# Patient Record
Sex: Male | Born: 1955
Health system: Southern US, Community
[De-identification: ages and names within clinical notes are randomized; demographics above are authoritative.]

## PROBLEM LIST (undated history)

## (undated) DIAGNOSIS — I10 Essential (primary) hypertension: Secondary | ICD-10-CM

## (undated) DIAGNOSIS — E785 Hyperlipidemia, unspecified: Secondary | ICD-10-CM

## (undated) DIAGNOSIS — G4730 Sleep apnea, unspecified: Secondary | ICD-10-CM

## (undated) DIAGNOSIS — M1990 Unspecified osteoarthritis, unspecified site: Secondary | ICD-10-CM

## (undated) DIAGNOSIS — K219 Gastro-esophageal reflux disease without esophagitis: Secondary | ICD-10-CM

## (undated) DIAGNOSIS — T7840XA Allergy, unspecified, initial encounter: Secondary | ICD-10-CM

## (undated) HISTORY — DX: Unspecified osteoarthritis, unspecified site: M19.90

## (undated) HISTORY — DX: Allergy, unspecified, initial encounter: T78.40XA

## (undated) HISTORY — DX: Essential (primary) hypertension: I10

## (undated) HISTORY — DX: Gastro-esophageal reflux disease without esophagitis: K21.9

## (undated) HISTORY — DX: Hyperlipidemia, unspecified: E78.5

## (undated) HISTORY — PX: APPENDECTOMY: SHX54

## (undated) HISTORY — DX: Sleep apnea, unspecified: G47.30

---

## 2008-04-05 ENCOUNTER — Ambulatory Visit: Payer: Self-pay | Admitting: Internal Medicine

## 2008-06-14 ENCOUNTER — Ambulatory Visit: Payer: Self-pay | Admitting: Internal Medicine

## 2015-02-24 ENCOUNTER — Encounter: Payer: Self-pay | Admitting: Family Medicine

## 2015-03-17 ENCOUNTER — Encounter: Payer: PRIVATE HEALTH INSURANCE | Admitting: Family Medicine

## 2015-04-14 ENCOUNTER — Encounter: Payer: PRIVATE HEALTH INSURANCE | Admitting: Family Medicine

## 2015-05-09 ENCOUNTER — Telehealth: Payer: Self-pay | Admitting: Family Medicine

## 2015-05-09 MED ORDER — ATORVASTATIN CALCIUM 40 MG PO TABS: 40.0000 mg | ORAL_TABLET | Freq: Every day | ORAL | Status: DC

## 2015-05-09 NOTE — Telephone Encounter (Signed)
Medication has been refilled ans sent to [facility], notified patient to schedule appointment

## 2015-05-26 ENCOUNTER — Encounter: Payer: Self-pay | Admitting: Family Medicine

## 2015-05-26 ENCOUNTER — Ambulatory Visit (INDEPENDENT_AMBULATORY_CARE_PROVIDER_SITE_OTHER): Payer: PRIVATE HEALTH INSURANCE | Admitting: Family Medicine

## 2015-05-26 VITALS — BP 136/78 | HR 65 | Temp 97.8°F | Resp 16 | Ht 74.0 in | Wt 226.3 lb

## 2015-05-26 DIAGNOSIS — F17200 Nicotine dependence, unspecified, uncomplicated: Secondary | ICD-10-CM | POA: Insufficient documentation

## 2015-05-26 DIAGNOSIS — E782 Mixed hyperlipidemia: Secondary | ICD-10-CM | POA: Insufficient documentation

## 2015-05-26 DIAGNOSIS — J0110 Acute frontal sinusitis, unspecified: Secondary | ICD-10-CM | POA: Insufficient documentation

## 2015-05-26 DIAGNOSIS — M436 Torticollis: Secondary | ICD-10-CM | POA: Diagnosis not present

## 2015-05-26 DIAGNOSIS — G4700 Insomnia, unspecified: Secondary | ICD-10-CM | POA: Insufficient documentation

## 2015-05-26 DIAGNOSIS — R0789 Other chest pain: Secondary | ICD-10-CM

## 2015-05-26 DIAGNOSIS — Z0000 Encounter for general adult medical examination without abnormal findings: Secondary | ICD-10-CM | POA: Insufficient documentation

## 2015-05-26 DIAGNOSIS — R0982 Postnasal drip: Secondary | ICD-10-CM | POA: Insufficient documentation

## 2015-05-26 DIAGNOSIS — Z23 Encounter for immunization: Secondary | ICD-10-CM | POA: Insufficient documentation

## 2015-05-26 DIAGNOSIS — J309 Allergic rhinitis, unspecified: Secondary | ICD-10-CM

## 2015-05-26 DIAGNOSIS — J310 Chronic rhinitis: Secondary | ICD-10-CM | POA: Insufficient documentation

## 2015-05-26 DIAGNOSIS — R5383 Other fatigue: Secondary | ICD-10-CM | POA: Insufficient documentation

## 2015-05-26 DIAGNOSIS — E669 Obesity, unspecified: Secondary | ICD-10-CM | POA: Insufficient documentation

## 2015-05-26 DIAGNOSIS — F17290 Nicotine dependence, other tobacco product, uncomplicated: Secondary | ICD-10-CM | POA: Insufficient documentation

## 2015-05-26 DIAGNOSIS — E785 Hyperlipidemia, unspecified: Secondary | ICD-10-CM | POA: Insufficient documentation

## 2015-05-26 DIAGNOSIS — G4733 Obstructive sleep apnea (adult) (pediatric): Secondary | ICD-10-CM | POA: Insufficient documentation

## 2015-05-26 DIAGNOSIS — J302 Other seasonal allergic rhinitis: Secondary | ICD-10-CM | POA: Insufficient documentation

## 2015-05-26 DIAGNOSIS — R030 Elevated blood-pressure reading, without diagnosis of hypertension: Secondary | ICD-10-CM

## 2015-05-26 DIAGNOSIS — IMO0001 Reserved for inherently not codable concepts without codable children: Secondary | ICD-10-CM | POA: Insufficient documentation

## 2015-05-26 DIAGNOSIS — R05 Cough: Secondary | ICD-10-CM | POA: Insufficient documentation

## 2015-05-26 DIAGNOSIS — M542 Cervicalgia: Secondary | ICD-10-CM | POA: Insufficient documentation

## 2015-05-26 DIAGNOSIS — K219 Gastro-esophageal reflux disease without esophagitis: Secondary | ICD-10-CM | POA: Insufficient documentation

## 2015-05-26 DIAGNOSIS — R058 Other specified cough: Secondary | ICD-10-CM | POA: Insufficient documentation

## 2015-05-26 DIAGNOSIS — R079 Chest pain, unspecified: Secondary | ICD-10-CM | POA: Insufficient documentation

## 2015-05-26 MED ORDER — AZITHROMYCIN 250 MG PO TABS: ORAL_TABLET | ORAL | Status: DC

## 2015-05-26 MED ORDER — METAXALONE 800 MG PO TABS: 800.0000 mg | ORAL_TABLET | Freq: Three times a day (TID) | ORAL | Status: DC

## 2015-05-26 NOTE — Progress Notes (Signed)
Name: Curtis Underwood   MRN: [id number]    DOB: [birth year]   Date:05/26/2015       Progress Note  Subjective  Chief Complaint  Chief Complaint  Patient presents with  . Annual Exam  . Muscle Pain    Patient has been having some muscle pain in his neck and mid chest. He stated that it could be stress related.  . Medication Refill    He thinks he needs a refill of his Liptior and his muscle relaxer  . Nasal Congestion    Patient had a nose bleed last night and has been taking a 24hr decongestant. Patient has also had some left ear pressure.    HPI  Pt. Is here for a Complete Physical Exam. He is doing well, except for some left sided neck pain, sinus pressure and congestion and had a nose bleed last night.  Past Medical History  Diagnosis Date  . Hyperlipidemia     Past Surgical History  Procedure Laterality Date  . Appendectomy      Family History  Problem Relation Age of Onset  . Adopted: Yes  . Family history unknown: Yes    Social History   Social History  . Marital Status: Married    Spouse Name: N/A  . Number of Children: N/A  . Years of Education: N/A   Occupational History  . Not on file.   Social History Main Topics  . Smoking status: Current Some Day Smoker    Types: Cigars  . Smokeless tobacco: Not on file  . Alcohol Use: 0.0 oz/week    0 Standard drinks or equivalent per week     Comment: occasionally  . Drug Use: No  . Sexual Activity:    Partners: Female   Other Topics Concern  . Not on file   Social History Narrative  . No narrative on file     Current outpatient prescriptions:  .  atorvastatin (LIPITOR) 40 MG tablet, Take 1 tablet (40 mg total) by mouth daily., Disp: 30 tablet, Rfl: 0 .  Cetirizine HCl (ZYRTEC ALLERGY) 10 MG CAPS, Take by mouth., Disp: , Rfl:  .  famotidine (PEPCID) 20 MG tablet, Take by mouth., Disp: , Rfl:  .  tiZANidine (ZANAFLEX) 4 MG tablet, Take by mouth., Disp: , Rfl:  .  fluticasone (FLONASE) 50 MCG/ACT  nasal spray, Place into the nose., Disp: , Rfl:   No Known Allergies   Review of Systems  Constitutional: Negative for fever, chills, weight loss and malaise/fatigue.  HENT: Positive for congestion, nosebleeds and sore throat.   Eyes: Positive for pain. Negative for blurred vision and double vision.  Respiratory: Negative for cough, sputum production and shortness of breath.   Cardiovascular: Positive for chest pain (substernal chest pain). Negative for leg swelling.  Gastrointestinal: Positive for heartburn. Negative for nausea and vomiting.  Genitourinary: Negative for dysuria, urgency and frequency.  Musculoskeletal: Positive for back pain (neck stiffness). Negative for myalgias.  Skin: Negative for rash.  Neurological: Positive for headaches.  Endo/Heme/Allergies: Positive for environmental allergies.  Psychiatric/Behavioral: Negative for depression. The patient is not nervous/anxious and does not have insomnia.     Objective  Filed Vitals:   05/26/15 0928  BP: 136/78  Pulse: 65  Temp: 97.8 F (36.6 C)  TempSrc: Oral  Resp: 16  Height: 6\' 2"  (1.88 m)  Weight: 226 lb 4.8 oz (102.649 kg)  SpO2: 98%    Physical Exam  Constitutional: He is oriented to person, place, and  time and well-developed, well-nourished, and in no distress.  HENT:  Head: Normocephalic and atraumatic.  Nose: Right sinus exhibits maxillary sinus tenderness and frontal sinus tenderness. Left sinus exhibits maxillary sinus tenderness and frontal sinus tenderness.  Cardiovascular: Normal rate, regular rhythm and normal heart sounds.   No murmur heard. Pulmonary/Chest: Effort normal and breath sounds normal. He has no wheezes. He has no rales.  Abdominal: Soft. Bowel sounds are normal. There is no tenderness.  Musculoskeletal: Normal range of motion.       Cervical back: He exhibits no tenderness and no pain.  Tightness on the neck, with tingling/numbness in the back of right upper arm.  Neurological:  He is alert and oriented to person, place, and time.  Skin: Skin is warm and dry.  Psychiatric: Mood, memory, affect and judgment normal.  Nursing note and vitals reviewed.   Assessment & Plan  1. Flu vaccine need  - Flu Vaccine QUAD 36+ mos IM  2. Annual physical exam  - Lipid Profile - CBC with Differential - Comprehensive Metabolic Panel (CMET) - TSH - PSA - Vitamin D (25 hydroxy)  3. Acute frontal sinusitis, recurrence not specified Rx Z-Pak for symptoms of sinusitis. - azithromycin (ZITHROMAX Z-PAK) 250 MG tablet; 2 tabs po x day 1, then 1 tab po q day x 4 days  Dispense: 6 each; Refill: 0  4. Neck stiffness Likely paraspinal muscle spasm. DC tizanidine and start on Skelaxin. Obtain x-ray of the cervical spine. - DG Cervical Spine Complete; Future - metaxalone (SKELAXIN) 800 MG tablet; Take 1 tablet (800 mg total) by mouth 3 (three) times daily.  Dispense: 21 tablet; Refill: 0  5. Chest pain of uncertain etiology EKG is unremarkable. Chest pain likely from muscle spasm of the chest wall. Referral to cardiology for further evaluation. - EKG 12-Lead - Ambulatory referral to Cardiology    [facility] 05/26/2015 10:07 AM

## 2015-05-28 ENCOUNTER — Encounter: Payer: Self-pay | Admitting: *Deleted

## 2015-07-04 ENCOUNTER — Telehealth: Payer: Self-pay | Admitting: Family Medicine

## 2015-07-04 NOTE — Telephone Encounter (Signed)
Pt needs refill on Atorvastine to be sent to [facility].

## 2015-07-07 ENCOUNTER — Other Ambulatory Visit: Payer: Self-pay | Admitting: Family Medicine

## 2015-07-07 NOTE — Telephone Encounter (Signed)
Pt needs refill on Atorvastine to be sent to [facility].

## 2015-07-11 MED ORDER — ATORVASTATIN CALCIUM 40 MG PO TABS: 40.0000 mg | ORAL_TABLET | Freq: Every day | ORAL | Status: DC

## 2015-07-11 NOTE — Telephone Encounter (Signed)
Atorvastatin 40 mg has been refilled and sent to [facility]

## 2016-11-16 ENCOUNTER — Ambulatory Visit (INDEPENDENT_AMBULATORY_CARE_PROVIDER_SITE_OTHER): Payer: BLUE CROSS/BLUE SHIELD | Admitting: Family Medicine

## 2016-11-16 ENCOUNTER — Encounter: Payer: Self-pay | Admitting: Family Medicine

## 2016-11-16 VITALS — BP 132/81 | HR 67 | Temp 97.7°F | Resp 16 | Ht 75.0 in | Wt 233.0 lb

## 2016-11-16 DIAGNOSIS — Z1211 Encounter for screening for malignant neoplasm of colon: Secondary | ICD-10-CM | POA: Diagnosis not present

## 2016-11-16 DIAGNOSIS — Z0000 Encounter for general adult medical examination without abnormal findings: Secondary | ICD-10-CM | POA: Diagnosis not present

## 2016-11-16 DIAGNOSIS — E785 Hyperlipidemia, unspecified: Secondary | ICD-10-CM

## 2016-11-16 DIAGNOSIS — R0789 Other chest pain: Secondary | ICD-10-CM | POA: Diagnosis not present

## 2016-11-16 DIAGNOSIS — R079 Chest pain, unspecified: Secondary | ICD-10-CM

## 2016-11-16 LAB — CBC WITH DIFFERENTIAL/PLATELET
BASOS PCT: 1 %
Basophils Absolute: 68 cells/uL (ref 0–200)
Eosinophils Absolute: 136 cells/uL (ref 15–500)
Eosinophils Relative: 2 %
HEMATOCRIT: 44.6 % (ref 38.5–50.0)
Hemoglobin: 15.2 g/dL (ref 13.2–17.1)
LYMPHS PCT: 25 %
Lymphs Abs: 1700 cells/uL (ref 850–3900)
MCH: 30.9 pg (ref 27.0–33.0)
MCHC: 34.1 g/dL (ref 32.0–36.0)
MCV: 90.7 fL (ref 80.0–100.0)
MONO ABS: 544 {cells}/uL (ref 200–950)
MPV: 12 fL (ref 7.5–12.5)
Monocytes Relative: 8 %
NEUTROS PCT: 64 %
Neutro Abs: 4352 cells/uL (ref 1500–7800)
PLATELETS: 222 10*3/uL (ref 140–400)
RBC: 4.92 MIL/uL (ref 4.20–5.80)
RDW: 13.8 % (ref 11.0–15.0)
WBC: 6.8 10*3/uL (ref 3.8–10.8)

## 2016-11-16 LAB — COMPLETE METABOLIC PANEL WITH GFR
ALBUMIN: 4.4 g/dL (ref 3.6–5.1)
ALK PHOS: 91 U/L (ref 40–115)
ALT: 41 U/L (ref 9–46)
AST: 26 U/L (ref 10–35)
BILIRUBIN TOTAL: 0.5 mg/dL (ref 0.2–1.2)
BUN: 17 mg/dL (ref 7–25)
CALCIUM: 9.6 mg/dL (ref 8.6–10.3)
CO2: 24 mmol/L (ref 20–31)
CREATININE: 1.2 mg/dL (ref 0.70–1.33)
Chloride: 103 mmol/L (ref 98–110)
GFR, EST AFRICAN AMERICAN: 76 mL/min (ref >=60)
GFR, Est Non African American: 66 mL/min (ref >=60)
Glucose, Bld: 87 mg/dL (ref 65–99)
Potassium: 4.3 mmol/L (ref 3.5–5.3)
SODIUM: 139 mmol/L (ref 135–146)
Total Protein: 7.1 g/dL (ref 6.1–8.1)

## 2016-11-16 LAB — LIPID PANEL
CHOL/HDL RATIO: 5.3 ratio — AB (ref <5.0)
CHOLESTEROL: 249 mg/dL — AB (ref <200)
HDL: 47 mg/dL (ref >40)
LDL Cholesterol: 171 mg/dL — ABNORMAL HIGH (ref <100)
Triglycerides: 156 mg/dL — ABNORMAL HIGH (ref <150)
VLDL: 31 mg/dL — ABNORMAL HIGH (ref <30)

## 2016-11-16 NOTE — Progress Notes (Signed)
Name: Curtis Underwood   MRN: [id number]    DOB: Mar 01, 1956   Date:11/16/2016       Progress Note  Subjective  Chief Complaint  Chief Complaint  Patient presents with  . Annual Exam    CPE    HPI  Pt. Presents for a complete physical exam, did not have a physical in 2017. His last colonoscopy was 5 years ago, records are not available. He is due for prostate cancer screening.  Past Medical History:  Diagnosis Date  . Hyperlipidemia     Past Surgical History:  Procedure Laterality Date  . APPENDECTOMY      Family History  Problem Relation Age of Onset  . Adopted: Yes  . Family history unknown: Yes    Social History   Social History  . Marital status: Married    Spouse name: N/A  . Number of children: N/A  . Years of education: N/A   Occupational History  . Not on file.   Social History Main Topics  . Smoking status: Current Some Day Smoker    Types: Cigars  . Smokeless tobacco: Never Used  . Alcohol use 0.0 oz/week     Comment: occasionally  . Drug use: No  . Sexual activity: Yes    Partners: Female   Other Topics Concern  . Not on file   Social History Narrative  . No narrative on file     Current Outpatient Prescriptions:  .  Cetirizine HCl (ZYRTEC ALLERGY) 10 MG CAPS, Take by mouth., Disp: , Rfl:  .  famotidine (PEPCID) 20 MG tablet, Take by mouth., Disp: , Rfl:  .  fluticasone (FLONASE) 50 MCG/ACT nasal spray, Place into the nose., Disp: , Rfl:  .  atorvastatin (LIPITOR) 40 MG tablet, Take 1 tablet (40 mg total) by mouth daily. (Patient not taking: Reported on 11/16/2016), Disp: 30 tablet, Rfl: 2 .  azithromycin (ZITHROMAX Z-PAK) 250 MG tablet, 2 tabs po x day 1, then 1 tab po q day x 4 days (Patient not taking: Reported on 11/16/2016), Disp: 6 each, Rfl: 0 .  metaxalone (SKELAXIN) 800 MG tablet, Take 1 tablet (800 mg total) by mouth 3 (three) times daily. (Patient not taking: Reported on 11/16/2016), Disp: 21 tablet, Rfl: 0 .  tiZANidine (ZANAFLEX)  4 MG tablet, Take by mouth., Disp: , Rfl:   No Known Allergies   Review of Systems  Constitutional: Positive for malaise/fatigue (feels tired at times). Negative for chills and fever.  HENT: Negative for congestion, ear pain, sinus pain (sinus congestion, takes anti-histamine.) and sore throat.   Eyes: Negative for blurred vision and double vision.  Respiratory: Negative for cough, sputum production and shortness of breath.   Cardiovascular: Negative for chest pain (occasional substernal chest discomfort), palpitations and leg swelling.  Gastrointestinal: Positive for heartburn. Negative for abdominal pain, blood in stool, constipation, diarrhea, nausea and vomiting.  Genitourinary: Negative for dysuria and hematuria.  Musculoskeletal: Positive for neck pain (chronic neck stiffness). Negative for back pain and joint pain.  Neurological: Negative for dizziness and headaches (occasional headaches due to neck stiffness).  Psychiatric/Behavioral: Negative for depression. The patient is not nervous/anxious and does not have insomnia.     Objective  Vitals:   11/16/16 1109  BP: 132/81  Pulse: 67  Resp: 16  Temp: 97.7 F (36.5 C)  TempSrc: Oral  SpO2: 95%  Weight: 233 lb (105.7 kg)  Height: 6\' 3"  (1.905 m)    Physical Exam  Constitutional: He is oriented to  person, place, and time and well-developed, well-nourished, and in no distress.  HENT:  Head: Normocephalic and atraumatic.  Cardiovascular: Normal rate, regular rhythm and normal heart sounds.   No murmur heard. Pulmonary/Chest: Effort normal and breath sounds normal.  Abdominal: Soft. Bowel sounds are normal. There is no tenderness.  Genitourinary: Prostate normal. Prostate is not enlarged and not tender.  Musculoskeletal: Normal range of motion. He exhibits no edema.  Neurological: He is alert and oriented to person, place, and time.  Skin: Skin is warm, dry and intact.  Psychiatric: Mood, memory, affect and judgment  normal.  Nursing note and vitals reviewed.       Assessment & Plan  1. Annual physical exam Obtain age-appropriate laboratory screenings - CBC with Differential/Platelet - COMPLETE METABOLIC PANEL WITH GFR - TSH - VITAMIN D 25 Hydroxy (Vit-D Deficiency, Fractures) - PSA  2. Dyslipidemia Has not been on statin therapy since last year, repeat FLP and consider starting on appropriate dosage of statin - Lipid panel  3. Chest pain of uncertain etiology Occasional chest pain, previous EKG in 2016 was unrevealing, referred to cardiology for further workup - Ambulatory referral to Cardiology  4. Screening for colon cancer  - Cologuard   [name] [facility] 11/16/2016 12:05 PM

## 2016-11-17 LAB — TSH: TSH: 2.93 mIU/L (ref 0.40–4.50)

## 2016-11-17 LAB — PSA: PSA: 2.3 ng/mL (ref <=4.0)

## 2016-11-17 LAB — VITAMIN D 25 HYDROXY (VIT D DEFICIENCY, FRACTURES): Vit D, 25-Hydroxy: 19 ng/mL — ABNORMAL LOW (ref 30–100)

## 2016-11-19 ENCOUNTER — Telehealth: Payer: Self-pay

## 2016-11-19 MED ORDER — VITAMIN D (ERGOCALCIFEROL) 1.25 MG (50000 UNIT) PO CAPS: 50000.0000 [IU] | ORAL_CAPSULE | ORAL | 0 refills | Status: DC

## 2016-11-19 NOTE — Telephone Encounter (Signed)
Patient has been notified of lab results and a prescription for vitamin D3 50,000 units take 1 capsule once a week x12 weeks has been sent to [facility] per Dr. Manuella Ghazi, patient has been notified and pt verbalized understanding

## 2016-12-02 ENCOUNTER — Ambulatory Visit: Payer: BLUE CROSS/BLUE SHIELD | Admitting: Family Medicine

## 2016-12-06 ENCOUNTER — Ambulatory Visit: Payer: Self-pay | Admitting: Cardiovascular Disease

## 2016-12-08 LAB — COLOGUARD: Cologuard: NEGATIVE

## 2016-12-15 ENCOUNTER — Encounter: Payer: Self-pay | Admitting: Family Medicine

## 2016-12-16 ENCOUNTER — Ambulatory Visit (INDEPENDENT_AMBULATORY_CARE_PROVIDER_SITE_OTHER): Payer: BLUE CROSS/BLUE SHIELD | Admitting: Family Medicine

## 2016-12-16 ENCOUNTER — Encounter: Payer: Self-pay | Admitting: Family Medicine

## 2016-12-16 VITALS — BP 136/76 | HR 67 | Temp 97.8°F | Resp 16 | Ht 75.0 in | Wt 229.8 lb

## 2016-12-16 DIAGNOSIS — F17290 Nicotine dependence, other tobacco product, uncomplicated: Secondary | ICD-10-CM

## 2016-12-16 DIAGNOSIS — E785 Hyperlipidemia, unspecified: Secondary | ICD-10-CM

## 2016-12-16 MED ORDER — ATORVASTATIN CALCIUM 20 MG PO TABS: 20.0000 mg | ORAL_TABLET | Freq: Every day | ORAL | 0 refills | Status: DC

## 2016-12-16 NOTE — Progress Notes (Signed)
Name: Curtis Underwood   MRN: [id number]    DOB: October 17, 1955   Date:12/16/2016       Progress Note  Subjective  Chief Complaint  Chief Complaint  Patient presents with  . Follow-up    Discuss statin therapy    Hyperlipidemia  This is a chronic problem. The problem is uncontrolled. Recent lipid tests were reviewed and are high. He is currently on no antihyperlipidemic treatment (did not take Atorvastatin while he was not working ).  Nicotine Dependence  Presents for initial visit. Symptoms include fatigue. Symptoms are negative for cravings, headache and insomnia. Preferred tobacco types include cigars. His urge triggers include drinking alcohol, meal time and stress. Risk factors do not include company of smokers.(Mainly smokes on the weekends, usually with alcohol.) Past treatments include nothing. Kamori is not interested in quitting. There is no history of alcohol abuse.     Past Medical History:  Diagnosis Date  . Hyperlipidemia     Past Surgical History:  Procedure Laterality Date  . APPENDECTOMY      Family History  Problem Relation Age of Onset  . Adopted: Yes  . Family history unknown: Yes    Social History   Social History  . Marital status: Married    Spouse name: N/A  . Number of children: N/A  . Years of education: N/A   Occupational History  . Not on file.   Social History Main Topics  . Smoking status: Current Some Day Smoker    Types: Cigars  . Smokeless tobacco: Never Used  . Alcohol use 0.0 oz/week     Comment: occasionally  . Drug use: No  . Sexual activity: Yes    Partners: Female   Other Topics Concern  . Not on file   Social History Narrative  . No narrative on file     Current Outpatient Prescriptions:  .  atorvastatin (LIPITOR) 40 MG tablet, Take 1 tablet (40 mg total) by mouth daily., Disp: 30 tablet, Rfl: 2 .  Cetirizine HCl (ZYRTEC ALLERGY) 10 MG CAPS, Take by mouth., Disp: , Rfl:  .  fluticasone (FLONASE) 50 MCG/ACT nasal  spray, Place into the nose., Disp: , Rfl:  .  tiZANidine (ZANAFLEX) 4 MG tablet, Take by mouth., Disp: , Rfl:  .  Vitamin D, Ergocalciferol, (DRISDOL) 50000 units CAPS capsule, Take 1 capsule (50,000 Units total) by mouth once a week. For 12 weeks, Disp: 12 capsule, Rfl: 0 .  azithromycin (ZITHROMAX Z-PAK) 250 MG tablet, 2 tabs po x day 1, then 1 tab po q day x 4 days (Patient not taking: Reported on 11/16/2016), Disp: 6 each, Rfl: 0 .  famotidine (PEPCID) 20 MG tablet, Take by mouth., Disp: , Rfl:  .  metaxalone (SKELAXIN) 800 MG tablet, Take 1 tablet (800 mg total) by mouth 3 (three) times daily. (Patient not taking: Reported on 11/16/2016), Disp: 21 tablet, Rfl: 0  No Known Allergies   Review of Systems  Constitutional: Positive for fatigue.  Psychiatric/Behavioral: The patient does not have insomnia.       Objective  Vitals:   12/16/16 1537  BP: 136/76  Pulse: 67  Resp: 16  Temp: 97.8 F (36.6 C)  TempSrc: Oral  SpO2: 96%  Weight: 229 lb 12.8 oz (104.2 kg)  Height: 6\' 3"  (1.905 m)    Physical Exam  Constitutional: He is oriented to person, place, and time and well-developed, well-nourished, and in no distress.  HENT:  Head: Normocephalic and atraumatic.  Cardiovascular: Normal rate,  regular rhythm and normal heart sounds.   No murmur heard. Pulmonary/Chest: Effort normal and breath sounds normal. He has no wheezes.  Abdominal: Soft. Bowel sounds are normal. There is no tenderness.  Neurological: He is alert and oriented to person, place, and time.  Psychiatric: Mood, memory, affect and judgment normal.  Nursing note and vitals reviewed.    Assessment & Plan  1. Dyslipidemia Restart on atorvastatin at a lower dose than he was taking before, patient is watching his diet and is beginning to come more physically active, recheck FLP in 6 months - atorvastatin (LIPITOR) 20 MG tablet; Take 1 tablet (20 mg total) by mouth daily at 6 PM.  Dispense: 90 tablet; Refill:  0  2. Smokes cigars Patient is not ready to quit at this time.   Nan Maya [facility] 12/16/2016 3:42 PM

## 2016-12-23 ENCOUNTER — Ambulatory Visit: Payer: Self-pay | Admitting: Cardiovascular Disease

## 2017-02-13 ENCOUNTER — Other Ambulatory Visit: Payer: Self-pay | Admitting: Family Medicine

## 2017-02-17 NOTE — Progress Notes (Signed)
Cardiology Office Note  Date:  02/18/2017   ID:  Curtis Underwood, DOB 28-Sep-1955, MRN [id number]  PCP:  Roselee Nova, MD   Chief Complaint  Patient presents with  . other    New patient. Referred by Dr. Manuella Ghazi for chest pain. Meds reviewed verbally with patient.     HPI:  Curtis Underwood is a 61 year old gentleman with past medical history of Hyperlipidemia, on and off a statin Smokes cigars Who presents by referral from Dr. Manuella Ghazi for consultation of his chest pain symptoms  He reports having chest pain on the left side of his chest that seems to come and go randomly. Not associated with exertion Typically he is active at baseline Describes his chest pain as a light ache on the left side of his chest  Recently started walking 30 minutes per day Denies having chest pain on exertion Wife reports that he has significant snoring, sleep apnea Reports he uses his nasal CPAP. Still snores  Works for [employer],  Active on the job, sometimes doing heavy lifting Does not have chest pain on exertion  Wonders if his chest pain could be a gas bubble or reflux  EKG personally reviewed by myself on todays visit Shows normal sinus rhythm with rate 65 bpm nonspecific T wave abnormality   PMH:   has a past medical history of Hyperlipidemia.  PSH:    Past Surgical History:  Procedure Laterality Date  . APPENDECTOMY      Current Outpatient Prescriptions  Medication Sig Dispense Refill  . atorvastatin (LIPITOR) 20 MG tablet Take 1 tablet (20 mg total) by mouth daily at 6 PM. 90 tablet 0  . Cetirizine HCl (ZYRTEC ALLERGY) 10 MG CAPS Take by mouth.    . famotidine (PEPCID) 20 MG tablet Take by mouth.    . fluticasone (FLONASE) 50 MCG/ACT nasal spray Place into the nose.    Marland Kitchen tiZANidine (ZANAFLEX) 4 MG tablet Take by mouth.    . Vitamin D, Ergocalciferol, (DRISDOL) 50000 units CAPS capsule Take 1 capsule (50,000 Units total) by mouth once a week. For 12 weeks 12 capsule 0   No current  facility-administered medications for this visit.      Allergies:   Patient has no known allergies.   Social History:  The patient  reports that he has been smoking Cigars.  He has never used smokeless tobacco. He reports that he drinks alcohol. He reports that he does not use drugs.   Family History:   He was adopted. Family history is unknown by patient.    Review of Systems: Review of Systems  Constitutional: Negative.   Respiratory: Negative.   Cardiovascular: Negative.   Gastrointestinal: Negative.   Musculoskeletal: Negative.   Neurological: Negative.   Psychiatric/Behavioral: Negative.   All other systems reviewed and are negative.    PHYSICAL EXAM: VS:  BP 130/76 (BP Location: Left Arm, Patient Position: Sitting, Cuff Size: Normal)   Pulse 65   Ht 6\' 3"  (1.905 m)   Wt 228 lb (103.4 kg)   BMI 28.50 kg/m  , BMI Body mass index is 28.5 kg/m. GEN: Well nourished, well developed, in no acute distress  HEENT: normal  Neck: no JVD, carotid bruits, or masses Cardiac: RRR; no murmurs, rubs, or gallops,no edema  Respiratory:  clear to auscultation bilaterally, normal work of breathing GI: soft, nontender, nondistended, + BS MS: no deformity or atrophy  Skin: warm and dry, no rash Neuro:  Strength and sensation are intact Psych: euthymic  mood, full affect    Recent Labs: 11/16/2016: ALT 41; BUN 17; Creat 1.20; Hemoglobin 15.2; Platelets 222; Potassium 4.3; Sodium 139; TSH 2.93    Lipid Panel Lab Results  Component Value Date   CHOL 249 (H) 11/16/2016   HDL 47 11/16/2016   LDLCALC 171 (H) 11/16/2016   TRIG 156 (H) 11/16/2016      Wt Readings from Last 3 Encounters:  02/18/17 228 lb (103.4 kg)  12/16/16 229 lb 12.8 oz (104.2 kg)  11/16/16 233 lb (105.7 kg)       ASSESSMENT AND PLAN:  Chest pain of uncertain etiology - Plan: EKG 12-Lead Atypical chest pain Presenting at rest, otherwise active with no symptoms He does have some risk factors including  hyperlipidemia, smoking Discussed various treatment options, risk stratification imaging studies After long discussion, we will schedule CT coronary calcium scoring If score is markedly low, would treat cholesterol only  Dyslipidemia Recommended he stay on his Lipitor Recheck lipid panel through primary care If CT coronary calcium score is elevated, would push for goal total cholesterol less than 150  Smokes cigars We have encouraged him to continue to work on weaning his cigarettes and smoking cessation. He will continue to work on this and does not want any assistance with chantix.  He reports only cigars on the weekend in his "[place]"  Obstructive sleep apnea Uses nasal CPap   Total encounter time more than 60 minutes  Greater than 50% was spent in counseling and coordination of care with the patient  Disposition:   F/U  as needed  Patient was seen in consultation for Dr. Manuella Ghazi, then will be referred back to his office for ongoing care of the medical issues detailed above   Orders Placed This Encounter  Procedures  . EKG 12-Lead     Signed, Esmond Plants, M.D., Ph.D. 02/18/2017  [place], [place]

## 2017-02-18 ENCOUNTER — Encounter: Payer: Self-pay | Admitting: Cardiovascular Disease

## 2017-02-18 ENCOUNTER — Ambulatory Visit (INDEPENDENT_AMBULATORY_CARE_PROVIDER_SITE_OTHER): Payer: BLUE CROSS/BLUE SHIELD | Admitting: Cardiovascular Disease

## 2017-02-18 VITALS — BP 130/76 | HR 65 | Ht 75.0 in | Wt 228.0 lb

## 2017-02-18 DIAGNOSIS — F17290 Nicotine dependence, other tobacco product, uncomplicated: Secondary | ICD-10-CM | POA: Diagnosis not present

## 2017-02-18 DIAGNOSIS — R0789 Other chest pain: Secondary | ICD-10-CM

## 2017-02-18 DIAGNOSIS — E785 Hyperlipidemia, unspecified: Secondary | ICD-10-CM | POA: Diagnosis not present

## 2017-02-18 DIAGNOSIS — G4733 Obstructive sleep apnea (adult) (pediatric): Secondary | ICD-10-CM | POA: Diagnosis not present

## 2017-02-18 DIAGNOSIS — R079 Chest pain, unspecified: Secondary | ICD-10-CM

## 2017-02-18 NOTE — Patient Instructions (Signed)
Medication Instructions:   No medication changes made  Labwork:  No new labs needed  Testing/Procedures:  We will schedule a CT coronary calcium score In Sept $150  Follow-Up: It was a pleasure seeing you in the office today. Please call us if you have new issues that need to be addressed before your next appt.  [phone]  Your physician wants you to follow-up in:  As needed  If you need a refill on your cardiac medications before your next appointment, please call your pharmacy.

## 2017-03-07 ENCOUNTER — Other Ambulatory Visit: Payer: Self-pay | Admitting: Family Medicine

## 2017-03-07 NOTE — Telephone Encounter (Signed)
Patient had low vit D; Dr. Manuella Ghazi treated with 12 weeks of vitamin D Rx I am denying the Rx Please let pt know no more Rx, address with Dr. Manuella Ghazi or take OTC per his last instructions or discuss at next visit

## 2017-03-13 ENCOUNTER — Other Ambulatory Visit: Payer: Self-pay | Admitting: Family Medicine

## 2017-03-21 ENCOUNTER — Other Ambulatory Visit: Payer: Self-pay | Admitting: Family Medicine

## 2017-03-21 DIAGNOSIS — E785 Hyperlipidemia, unspecified: Secondary | ICD-10-CM

## 2017-04-10 ENCOUNTER — Other Ambulatory Visit: Payer: Self-pay | Admitting: Family Medicine

## 2017-04-10 DIAGNOSIS — E785 Hyperlipidemia, unspecified: Secondary | ICD-10-CM

## 2017-05-18 ENCOUNTER — Ambulatory Visit: Payer: BLUE CROSS/BLUE SHIELD | Admitting: Family Medicine

## 2017-07-04 ENCOUNTER — Ambulatory Visit (INDEPENDENT_AMBULATORY_CARE_PROVIDER_SITE_OTHER): Payer: BLUE CROSS/BLUE SHIELD | Admitting: Family Medicine

## 2017-07-04 ENCOUNTER — Encounter: Payer: Self-pay | Admitting: Family Medicine

## 2017-07-04 VITALS — BP 124/76 | HR 73 | Temp 97.7°F | Ht 75.0 in | Wt 221.7 lb

## 2017-07-04 DIAGNOSIS — J0140 Acute pansinusitis, unspecified: Secondary | ICD-10-CM

## 2017-07-04 DIAGNOSIS — R05 Cough: Secondary | ICD-10-CM | POA: Diagnosis not present

## 2017-07-04 DIAGNOSIS — R059 Cough, unspecified: Secondary | ICD-10-CM

## 2017-07-04 MED ORDER — BENZONATATE 100 MG PO CAPS: 100.0000 mg | ORAL_CAPSULE | Freq: Three times a day (TID) | ORAL | 0 refills | Status: DC | PRN

## 2017-07-04 MED ORDER — FLUTICASONE PROPIONATE 50 MCG/ACT NA SUSP: 2.0000 | NASAL | 2 refills | Status: DC | PRN

## 2017-07-04 MED ORDER — AMOXICILLIN-POT CLAVULANATE 875-125 MG PO TABS: 1.0000 | ORAL_TABLET | Freq: Two times a day (BID) | ORAL | 0 refills | Status: DC

## 2017-07-04 NOTE — Progress Notes (Signed)
Name: Curtis Underwood   MRN: [id number]    DOB: 05/05/1956   Date:07/04/2017       Progress Note  Subjective  Chief Complaint  Chief Complaint  Patient presents with  . Cough    with greenish mucus, has post nasal drainage, with sore throat, Denies fever, has tried dayquil and nyquil   . Sinus Problem    Pt has alot of sinus pressue and pain    HPI  Patient presents with 4 days of sore throat, productive (green sputum) cough, sinus congestion, and headache.  Woke up today with significant sinus pain and pressure (10/10 pain), now having about 5-6/10. - has history of sinus infections in the past and this is his typical presentation. Denies ear pain/pressure, chest pain, shortness of breath, NVD, abdominal pain. Uses flonase PRN - advised to use daily until feeling better. Has Zyrtec and stopped taking because he's taking Dayquil and Nyquil.  Advised may take Zyrtec daily as well.  Patient Active Problem List   Diagnosis Date Noted  . Acid reflux 05/26/2015  . Allergic rhinitis 05/26/2015  . Encounter for general adult medical examination without abnormal findings 05/26/2015  . Cervical pain 05/26/2015  . Dyslipidemia 05/26/2015  . Blood pressure elevated 05/26/2015  . Elevated cholesterol with elevated triglycerides 05/26/2015  . Cannot sleep 05/26/2015  . Adiposity 05/26/2015  . Smokes cigars 05/26/2015  . Obstructive sleep apnea 05/26/2015  . PND (post-nasal drip) 05/26/2015  . Inflamed nasal mucosa 05/26/2015  . Annual physical exam 05/26/2015  . Sinusitis, acute frontal 05/26/2015  . Neck stiffness 05/26/2015  . Chest pain of uncertain etiology 19/37/9024  . Encounter for immunization 05/26/2015    Social History   Tobacco Use  . Smoking status: Current Some Day Smoker    Types: Cigars  . Smokeless tobacco: Never Used  Substance Use Topics  . Alcohol use: Yes    Alcohol/week: 0.0 oz    Comment: occasionally     Current Outpatient Medications:  .  Cetirizine  HCl (ZYRTEC ALLERGY) 10 MG CAPS, Take by mouth., Disp: , Rfl:  .  Pseudoeph-Doxylamine-DM-APAP (NYQUIL PO), Take by mouth., Disp: , Rfl:  .  Pseudoephedrine-APAP-DM (DAYQUIL PO), Take by mouth., Disp: , Rfl:  .  atorvastatin (LIPITOR) 20 MG tablet, Take 1 tablet (20 mg total) by mouth daily at 6 PM., Disp: 90 tablet, Rfl: 0 .  famotidine (PEPCID) 20 MG tablet, Take by mouth as needed. , Disp: , Rfl:  .  fluticasone (FLONASE) 50 MCG/ACT nasal spray, Place into the nose as needed. , Disp: , Rfl:  .  tiZANidine (ZANAFLEX) 4 MG tablet, Take by mouth., Disp: , Rfl:   No Known Allergies  ROS  Ten systems reviewed and is negative except as mentioned in HPI  Objective  Vitals:   07/04/17 1047  BP: 124/76  Pulse: 73  Temp: 97.7 F (36.5 C)  TempSrc: Oral  SpO2: 99%  Weight: 221 lb 11.2 oz (100.6 kg)  Height: 6\' 3"  (1.905 m)    Body mass index is 27.71 kg/m.  Nursing Note and Vital Signs reviewed.  Physical Exam  Constitutional: Patient appears well-developed and well-nourished. Obese No distress.  HEENT: head atraumatic, normocephalic, pupils equal and reactive to light, EOM's intact, TM's without erythema or bulging, tenderness of maxillary and frontal sinus pain on palpation, neck supple with bilateral submandibular lymphadenopathy, oropharynx injected and moist without exudate Cardiovascular: Normal rate, regular rhythm, S1/S2 present.  No murmur or rub heard. No BLE edema. Pulmonary/Chest:  Effort normal and breath sounds clear. No respiratory distress or retractions. Psychiatric: Patient has a normal mood and affect. behavior is normal. Judgment and thought content normal.  No results found for this or any previous visit (from the past 2160 hour(s)).   Assessment & Plan  1. Acute non-recurrent pansinusitis - amoxicillin-clavulanate (AUGMENTIN) 875-125 MG tablet; Take 1 tablet by mouth 2 (two) times daily.  Dispense: 20 tablet; Refill: 0 - benzonatate (TESSALON) 100 MG  capsule; Take 1-2 capsules (100-200 mg total) by mouth 3 (three) times daily as needed for cough.  Dispense: 30 capsule; Refill: 0 - fluticasone (FLONASE) 50 MCG/ACT nasal spray; Place 2 sprays into both nostrils as needed.  Dispense: 16 g; Refill: 2  2. Cough - benzonatate (TESSALON) 100 MG capsule; Take 1-2 capsules (100-200 mg total) by mouth 3 (three) times daily as needed for cough.  Dispense: 30 capsule; Refill: 0  - Use flonase and Zyrtec, discussed Tylenol caution with Nyquil/Dayquil use. Drink plenty of fluids.  Work note for today provided. -Red flags and when to present for emergency care or RTC including fever >101.83F, chest pain, shortness of breath, new/worsening/un-resolving symptoms, swelling around the eyes or vision changes, reviewed with patient at time of visit. Follow up and care instructions discussed and provided in AVS.

## 2017-07-04 NOTE — Patient Instructions (Addendum)
Please take a probiotic or eat probiotic yogurt while taking Augmentin.  Sinusitis, Adult Sinusitis is soreness and inflammation of your sinuses. Sinuses are hollow spaces in the bones around your face. They are located:  Around your eyes.  In the middle of your forehead.  Behind your nose.  In your cheekbones.  Your sinuses and nasal passages are lined with a stringy fluid (mucus). Mucus normally drains out of your sinuses. When your nasal tissues get inflamed or swollen, the mucus can get trapped or blocked so air cannot flow through your sinuses. This lets bacteria, viruses, and funguses grow, and that leads to infection. Follow these instructions at home: Medicines  Take, use, or apply over-the-counter and prescription medicines only as told by your doctor. These may include nasal sprays.  If you were prescribed an antibiotic medicine, take it as told by your doctor. Do not stop taking the antibiotic even if you start to feel better. Hydrate and Humidify  Drink enough water to keep your pee (urine) clear or pale yellow.  Use a cool mist humidifier to keep the humidity level in your home above 50%.  Breathe in steam for 10-15 minutes, 3-4 times a day or as told by your doctor. You can do this in the bathroom while a hot shower is running.  Try not to spend time in cool or dry air. Rest  Rest as much as possible.  Sleep with your head raised (elevated).  Make sure to get enough sleep each night. General instructions  Put a warm, moist washcloth on your face 3-4 times a day or as told by your doctor. This will help with discomfort.  Wash your hands often with soap and water. If there is no soap and water, use hand sanitizer.  Do not smoke. Avoid being around people who are smoking (secondhand smoke).  Keep all follow-up visits as told by your doctor. This is important. Contact a doctor if:  You have a fever.  Your symptoms get worse.  Your symptoms do not get  better within 10 days. Get help right away if:  You have a very bad headache.  You cannot stop throwing up (vomiting).  You have pain or swelling around your face or eyes.  You have trouble seeing.  You feel confused.  Your neck is stiff.  You have trouble breathing. This information is not intended to replace advice given to you by your health care provider. Make sure you discuss any questions you have with your health care provider. Document Released: 01/05/2008 Document Revised: 03/14/2016 Document Reviewed: 05/14/2015 Elsevier Interactive Patient Education  [employer].

## 2017-07-12 ENCOUNTER — Ambulatory Visit: Payer: BLUE CROSS/BLUE SHIELD | Admitting: Family Medicine

## 2017-08-09 ENCOUNTER — Telehealth: Payer: Self-pay | Admitting: Family Medicine

## 2017-08-09 ENCOUNTER — Ambulatory Visit (INDEPENDENT_AMBULATORY_CARE_PROVIDER_SITE_OTHER): Payer: BLUE CROSS/BLUE SHIELD | Admitting: Family Medicine

## 2017-08-09 ENCOUNTER — Encounter: Payer: Self-pay | Admitting: Family Medicine

## 2017-08-09 VITALS — BP 126/78 | HR 74 | Temp 98.2°F | Resp 16 | Wt 219.6 lb

## 2017-08-09 DIAGNOSIS — J0100 Acute maxillary sinusitis, unspecified: Secondary | ICD-10-CM

## 2017-08-09 DIAGNOSIS — R05 Cough: Secondary | ICD-10-CM | POA: Diagnosis not present

## 2017-08-09 DIAGNOSIS — E785 Hyperlipidemia, unspecified: Secondary | ICD-10-CM

## 2017-08-09 DIAGNOSIS — R059 Cough, unspecified: Secondary | ICD-10-CM

## 2017-08-09 DIAGNOSIS — R0982 Postnasal drip: Secondary | ICD-10-CM | POA: Diagnosis not present

## 2017-08-09 MED ORDER — FLUTICASONE PROPIONATE 50 MCG/ACT NA SUSP: 2.0000 | Freq: Every day | NASAL | 0 refills | Status: DC

## 2017-08-09 MED ORDER — AZITHROMYCIN 250 MG PO TABS: ORAL_TABLET | ORAL | 0 refills | Status: DC

## 2017-08-09 MED ORDER — ATORVASTATIN CALCIUM 20 MG PO TABS: 20.0000 mg | ORAL_TABLET | Freq: Every day | ORAL | 0 refills | Status: DC

## 2017-08-09 MED ORDER — GUAIFENESIN-CODEINE 100-10 MG/5ML PO SYRP: 10.0000 mL | ORAL_SOLUTION | Freq: Three times a day (TID) | ORAL | 0 refills | Status: DC | PRN

## 2017-08-09 NOTE — Telephone Encounter (Signed)
Patient was seen in office today and started on antibiotics. Please see note from 08/09/2017

## 2017-08-09 NOTE — Telephone Encounter (Signed)
Copied from [place] [id number]. Topic: Quick Communication - See Telephone Encounter >> Aug 09, 2017  8:01 AM Aurelio Brash B wrote: CRM for notification. See Telephone encounter for:  Pt was seen 12/3 for cough,  given antibiotic but not better,  pt is hoarse can't talk and still coughing up green, asking for another medication/antibiotic 08/09/17.  [facility]

## 2017-08-09 NOTE — Progress Notes (Signed)
Name: Curtis Underwood   MRN: [id number]    DOB: [birth year]   Date:08/09/2017       Progress Note  Subjective  Chief Complaint  Chief Complaint  Patient presents with  . Cough    lingering cough and green mucus and hoarseness since sunday.   . Nasal Congestion    nasal and chest; Pt felt clamy but denies any fever.    . Sore Throat    mild due to the drainage     Sinusitis  This is a new problem. The current episode started in the past 7 days (2 days ago). There has been no fever. Associated symptoms include congestion, coughing (green mucus), headaches, sinus pressure and a sore throat. Past treatments include oral decongestants (Dayquil and Nyquil).      Past Medical History:  Diagnosis Date  . Hyperlipidemia     Past Surgical History:  Procedure Laterality Date  . APPENDECTOMY      Family History  Adopted: Yes  Family history unknown: Yes    Social History   Socioeconomic History  . Marital status: Married    Spouse name: Not on file  . Number of children: Not on file  . Years of education: Not on file  . Highest education level: Not on file  Social Needs  . Financial resource strain: Not on file  . Food insecurity - worry: Not on file  . Food insecurity - inability: Not on file  . Transportation needs - medical: Not on file  . Transportation needs - non-medical: Not on file  Occupational History  . Not on file  Tobacco Use  . Smoking status: Current Some Day Smoker    Types: Cigars  . Smokeless tobacco: Never Used  Substance and Sexual Activity  . Alcohol use: Yes    Alcohol/week: 0.0 oz    Comment: occasionally  . Drug use: No  . Sexual activity: Yes    Partners: Female  Other Topics Concern  . Not on file  Social History Narrative  . Not on file     Current Outpatient Medications:  .  benzonatate (TESSALON) 100 MG capsule, Take 1-2 capsules (100-200 mg total) by mouth 3 (three) times daily as needed for cough., Disp: 30 capsule, Rfl: 0 .   Cetirizine HCl (ZYRTEC ALLERGY) 10 MG CAPS, Take by mouth., Disp: , Rfl:  .  famotidine (PEPCID) 20 MG tablet, Take by mouth as needed. , Disp: , Rfl:  .  fluticasone (FLONASE) 50 MCG/ACT nasal spray, Place 2 sprays into both nostrils as needed., Disp: 16 g, Rfl: 2 .  Pseudoeph-Doxylamine-DM-APAP (NYQUIL PO), Take by mouth., Disp: , Rfl:  .  Pseudoephedrine-APAP-DM (DAYQUIL PO), Take by mouth., Disp: , Rfl:  .  amoxicillin-clavulanate (AUGMENTIN) 875-125 MG tablet, Take 1 tablet by mouth 2 (two) times daily. (Patient not taking: Reported on 08/09/2017), Disp: 20 tablet, Rfl: 0 .  atorvastatin (LIPITOR) 20 MG tablet, Take 1 tablet (20 mg total) by mouth daily at 6 PM. (Patient not taking: Reported on 08/09/2017), Disp: 90 tablet, Rfl: 0 .  tiZANidine (ZANAFLEX) 4 MG tablet, Take by mouth., Disp: , Rfl:   No Known Allergies   Review of Systems  HENT: Positive for congestion, sinus pressure and sore throat.   Respiratory: Positive for cough (green mucus).   Neurological: Positive for headaches.      Objective  Vitals:   08/09/17 1150  BP: 126/78  Pulse: 74  Resp: 16  Temp: 98.2 F (36.8 C)  TempSrc: Oral  SpO2: 96%  Weight: 219 lb 9.6 oz (99.6 kg)    Physical Exam  Constitutional: He is oriented to person, place, and time and well-developed, well-nourished, and in no distress.  HENT:  Head: Normocephalic and atraumatic.  Right Ear: Tympanic membrane and ear canal normal. No drainage or swelling.  Left Ear: Tympanic membrane and ear canal normal. No drainage or swelling.  Nose: Right sinus exhibits maxillary sinus tenderness and frontal sinus tenderness. Left sinus exhibits maxillary sinus tenderness and frontal sinus tenderness.  Mouth/Throat: Posterior oropharyngeal erythema present.  Nasal mucosal inflammation,   Cardiovascular: Normal rate, regular rhythm and normal heart sounds.  No murmur heard. Pulmonary/Chest: Effort normal and breath sounds normal. He has no wheezes.   Neurological: He is alert and oriented to person, place, and time.  Psychiatric: Mood, memory, affect and judgment normal.  Nursing note and vitals reviewed.    Assessment & Plan  1. Post-nasal drainage  - fluticasone (FLONASE) 50 MCG/ACT nasal spray; Place 2 sprays into both nostrils daily.  Dispense: 16 g; Refill: 0  2. Acute non-recurrent maxillary sinusitis  - azithromycin (ZITHROMAX) 250 MG tablet; 2 tabs po day 1, then 1 tab po q day x 4  days  Dispense: 6 tablet; Refill: 0  3. Cough  - guaiFENesin-codeine (CHERATUSSIN AC) 100-10 MG/5ML syrup; Take 10 mLs by mouth 3 (three) times daily as needed for cough.  Dispense: 150 mL; Refill: 0  4. Dyslipidemia  - atorvastatin (LIPITOR) 20 MG tablet; Take 1 tablet (20 mg total) by mouth daily at 6 PM.  Dispense: 90 tablet; Refill: 0 - Lipid panel  Jaylani Mcguinn [facility]ad A[facility] 08/09/2017 12:03 PM

## 2017-09-01 ENCOUNTER — Other Ambulatory Visit: Payer: Self-pay

## 2017-09-01 DIAGNOSIS — R0982 Postnasal drip: Secondary | ICD-10-CM

## 2017-09-01 MED ORDER — FLUTICASONE PROPIONATE 50 MCG/ACT NA SUSP: 2.0000 | Freq: Every day | NASAL | 0 refills | Status: DC

## 2017-10-31 ENCOUNTER — Other Ambulatory Visit: Payer: Self-pay

## 2017-10-31 DIAGNOSIS — E785 Hyperlipidemia, unspecified: Secondary | ICD-10-CM

## 2017-10-31 DIAGNOSIS — Z8639 Personal history of other endocrine, nutritional and metabolic disease: Secondary | ICD-10-CM

## 2017-10-31 NOTE — Telephone Encounter (Signed)
I reviewed last note It looks like Dr. Manuella Ghazi wanted him to get a lipid panel back in January (3 months ago) Please ask him to have that done this week so we can make sure the lipitor is the correct strength; it may need to be adjusted It also looks like Dr. Manuella Ghazi wanted a vitamin D rechecked, so please add that, dx vitamin D deficiency Lastly, I don't see any visits coming up, so please get him on the schedule for July, 6 months from last visit Thank you

## 2017-10-31 NOTE — Telephone Encounter (Signed)
Pt will be out of meds in a week, trying to stay ahead so he won't run out

## 2017-10-31 NOTE — Telephone Encounter (Signed)
Called pt informed him of the need to come in for labs, pt gave verbal understanding. Vit D ordered. Schedule pt for 02/06/18 @ 9:00am

## 2017-11-07 ENCOUNTER — Other Ambulatory Visit: Payer: Self-pay | Admitting: Family Medicine

## 2017-11-07 DIAGNOSIS — E785 Hyperlipidemia, unspecified: Secondary | ICD-10-CM

## 2017-11-07 MED ORDER — ATORVASTATIN CALCIUM 20 MG PO TABS: 20.0000 mg | ORAL_TABLET | Freq: Every day | ORAL | 1 refills | Status: DC

## 2017-11-07 NOTE — Progress Notes (Signed)
Refilled the statin 

## 2017-11-10 LAB — LIPID PANEL
CHOL/HDL RATIO: 4.1 (calc) (ref <5.0)
Cholesterol: 185 mg/dL (ref <200)
HDL: 45 mg/dL (ref >40)
LDL Cholesterol (Calc): 118 mg/dL (calc) — ABNORMAL HIGH
NON-HDL CHOLESTEROL (CALC): 140 mg/dL — AB (ref <130)
TRIGLYCERIDES: 113 mg/dL (ref <150)

## 2017-11-10 LAB — TEST AUTHORIZATION

## 2017-11-10 LAB — VITAMIN D 25 HYDROXY (VIT D DEFICIENCY, FRACTURES): Vit D, 25-Hydroxy: 21 ng/mL — ABNORMAL LOW (ref 30–100)

## 2017-12-02 ENCOUNTER — Encounter: Payer: Self-pay | Admitting: Emergency Medicine

## 2017-12-02 ENCOUNTER — Emergency Department
Admission: EM | Admit: 2017-12-02 | Discharge: 2017-12-03 | Disposition: A | Discharge status: Home / Self Care | Payer: BLUE CROSS/BLUE SHIELD | Attending: Emergency Medicine | Admitting: Emergency Medicine

## 2017-12-02 ENCOUNTER — Other Ambulatory Visit: Payer: Self-pay

## 2017-12-02 ENCOUNTER — Emergency Department: Payer: BLUE CROSS/BLUE SHIELD

## 2017-12-02 DIAGNOSIS — F17210 Nicotine dependence, cigarettes, uncomplicated: Secondary | ICD-10-CM | POA: Insufficient documentation

## 2017-12-02 DIAGNOSIS — R202 Paresthesia of skin: Secondary | ICD-10-CM

## 2017-12-02 DIAGNOSIS — G459 Transient cerebral ischemic attack, unspecified: Secondary | ICD-10-CM | POA: Insufficient documentation

## 2017-12-02 DIAGNOSIS — Z79899 Other long term (current) drug therapy: Secondary | ICD-10-CM | POA: Insufficient documentation

## 2017-12-02 LAB — URINALYSIS, COMPLETE (UACMP) WITH MICROSCOPIC
BACTERIA UA: NONE SEEN
BILIRUBIN URINE: NEGATIVE
Glucose, UA: NEGATIVE mg/dL
Hgb urine dipstick: NEGATIVE
Ketones, ur: NEGATIVE mg/dL
LEUKOCYTES UA: NEGATIVE
Nitrite: NEGATIVE
PH: 5 (ref 5.0–8.0)
Protein, ur: NEGATIVE mg/dL
SQUAMOUS EPITHELIAL / LPF: NONE SEEN (ref 0–5)
Specific Gravity, Urine: 1.009 (ref 1.005–1.030)

## 2017-12-02 LAB — BASIC METABOLIC PANEL
Anion gap: 7 (ref 5–15)
BUN: 13 mg/dL (ref 6–20)
CHLORIDE: 104 mmol/L (ref 101–111)
CO2: 28 mmol/L (ref 22–32)
Calcium: 8.7 mg/dL — ABNORMAL LOW (ref 8.9–10.3)
Creatinine, Ser: 1.08 mg/dL (ref 0.61–1.24)
GFR calc Af Amer: >60 mL/min (ref >60)
GFR calc non Af Amer: >60 mL/min (ref >60)
Glucose, Bld: 101 mg/dL — ABNORMAL HIGH (ref 65–99)
POTASSIUM: 3.8 mmol/L (ref 3.5–5.1)
Sodium: 139 mmol/L (ref 135–145)

## 2017-12-02 LAB — CBC
HEMATOCRIT: 44.2 % (ref 40.0–52.0)
Hemoglobin: 14.8 g/dL (ref 13.0–18.0)
MCH: 30.8 pg (ref 26.0–34.0)
MCHC: 33.4 g/dL (ref 32.0–36.0)
MCV: 92.3 fL (ref 80.0–100.0)
PLATELETS: 192 10*3/uL (ref 150–440)
RBC: 4.78 MIL/uL (ref 4.40–5.90)
RDW: 12.6 % (ref 11.5–14.5)
WBC: 9.7 10*3/uL (ref 3.8–10.6)

## 2017-12-02 NOTE — ED Triage Notes (Signed)
Pt arrives POV with c/o left sided numbness within the last fifteen minutes. Pt is experiencing light headedness at this time with no neurologic defects. Pt appears in NAD.

## 2017-12-03 ENCOUNTER — Emergency Department: Payer: BLUE CROSS/BLUE SHIELD

## 2017-12-03 LAB — TROPONIN I: Troponin I: <0.03 ng/mL (ref <0.03)

## 2017-12-03 MED ORDER — ASPIRIN 81 MG PO CHEW
324.0000 mg | CHEWABLE_TABLET | Freq: Once | ORAL | Status: AC
  Administered 2017-12-03: 324 mg via ORAL
  Filled 2017-12-03: qty 4

## 2017-12-03 NOTE — Discharge Instructions (Signed)
Please start taking full dose aspirin daily. Please follow up with your primary care physician and neurology.

## 2017-12-03 NOTE — ED Notes (Addendum)
Pt states earlier (approx dinner time) sudden onset pins and needles tingling in left arm, left side of face/head, and burred vision. regular cigar smoker.   Pt states also lightheaded during lunch today-states think related to heat because was outside exercising.   Pt states sx resolved at this time.

## 2017-12-03 NOTE — ED Notes (Signed)
Patient transported to MRI 

## 2017-12-03 NOTE — ED Notes (Signed)
ED Provider at bedside. 

## 2017-12-03 NOTE — ED Provider Notes (Signed)
[facility] Emergency Department Provider Note   ____________________________________________   First MD Initiated Contact with Patient 12/03/17 0007     (approximate)  I have reviewed the triage vital signs and the nursing notes.   HISTORY  Chief Complaint Weakness    HPI Curtis Underwood is a 62 y.o. male who comes into the hospital today with some left eye blurred vision facial numbness and tingling as well as some tingling and numbness in his left arm and left leg.  The patient states that he went to work.  He left to get some food and states that as he was driving his left vision became blurry.  The patient developed some tingling and numbness around his eye and forehead.  He also states that he felt some of the symptoms in his left arm and leg.  They felt like they were asleep.  The patient was concerned so instead of going to get food he decided to come here.  He had been having the symptoms for about 15 to 20 minutes before he arrived.  The patient also had some lightheadedness this afternoon when he was walking his dog but states that he felt better after drinking some water and taking a cold shower.  He states that it was likely because it was so hot.  The patient's symptoms of left blurred vision numbness and tingling are improved at this time.  He states that his arm also did feel weak but he had no slurring of the speech.  The patient is here for evaluation.  He is never had these symptoms before.   Past Medical History:  Diagnosis Date  . Hyperlipidemia     Patient Active Problem List   Diagnosis Date Noted  . Acid reflux 05/26/2015  . Allergic rhinitis 05/26/2015  . Cervical pain 05/26/2015  . Dyslipidemia 05/26/2015  . Blood pressure elevated 05/26/2015  . Elevated cholesterol with elevated triglycerides 05/26/2015  . Cannot sleep 05/26/2015  . Adiposity 05/26/2015  . Smokes cigars 05/26/2015  . Obstructive sleep apnea 05/26/2015  . PND  (post-nasal drip) 05/26/2015  . Annual physical exam 05/26/2015  . Neck stiffness 05/26/2015  . Chest pain of uncertain etiology 16/05/9603    Past Surgical History:  Procedure Laterality Date  . APPENDECTOMY      Prior to Admission medications   Medication Sig Start Date End Date Taking? Authorizing Provider  atorvastatin (LIPITOR) 20 MG tablet Take 1 tablet (20 mg total) by mouth daily at 6 PM. 11/07/17   Lada, Satira Anis, MD  Cetirizine HCl (ZYRTEC ALLERGY) 10 MG CAPS Take by mouth.    [provider]  famotidine (PEPCID) 20 MG tablet Take by mouth as needed.     [provider]  fluticasone (FLONASE) 50 MCG/ACT nasal spray Place 2 sprays into both nostrils daily. 09/01/17   Roselee Nova, MD  tiZANidine (ZANAFLEX) 4 MG tablet Take by mouth. 11/18/14   [provider]    Allergies Patient has no known allergies.  Family History  Adopted: Yes  Family history unknown: Yes    Social History Social History   Tobacco Use  . Smoking status: Current Some Day Smoker    Types: Cigars  . Smokeless tobacco: Never Used  Substance Use Topics  . Alcohol use: Yes    Alcohol/week: 0.0 oz    Comment: occasionally  . Drug use: No    Review of Systems  Constitutional: No fever/chills Eyes: left eye blurred vision  ENT: No sore throat. Cardiovascular: Denies chest pain. Respiratory: Denies shortness of breath. Gastrointestinal: No abdominal pain.  No nausea, no vomiting.  No diarrhea.  No constipation. Genitourinary: Negative for dysuria. Musculoskeletal: Negative for back pain. Skin: Negative for rash. Neurological: left upper and lower extremity numbness with some mild left upper extremity weakness   ____________________________________________   PHYSICAL EXAM:  VITAL SIGNS: ED Triage Vitals  Enc Vitals Group     BP 12/02/17 1959 (!) 170/83     Pulse Rate 12/02/17 1959 68     Resp 12/02/17 1959 16     Temp 12/02/17 1959 98.3 F (36.8 C)      Temp Source 12/02/17 1959 Oral     SpO2 12/02/17 1959 99 %     Weight 12/02/17 1959 223 lb (101.2 kg)     Height 12/02/17 1959 6\' 2"  (1.88 m)     Head Circumference --      Peak Flow --      Pain Score 12/02/17 2003 0     Pain Loc --      Pain Edu? --      Excl. in [place]? --     Constitutional: Alert and oriented. Well appearing and in no acute distress. Eyes: Conjunctivae are normal. PERRL. EOMI. Head: Atraumatic. Nose: No congestion/rhinnorhea. Mouth/Throat: Mucous membranes are moist.  Oropharynx non-erythematous. Cardiovascular: Normal rate, regular rhythm. Grossly normal heart sounds.  Good peripheral circulation. Respiratory: Normal respiratory effort.  No retractions. Lungs CTAB. Gastrointestinal: Soft and nontender. No distention. Positive bowel sounds Musculoskeletal: No lower extremity tenderness nor edema.  No joint effusions. Neurologic:  Normal speech and language.  Cranial nerves II through XII are grossly intact with no focal motor or neuro deficit no pronator drift, finger-to-nose intact, no dysmetria rapid alternating movement intact. Skin:  Skin is warm, dry and intact.  Psychiatric: Mood and affect are normal.   ____________________________________________   LABS (all labs ordered are listed, but only abnormal results are displayed)  Labs Reviewed  BASIC METABOLIC PANEL - Abnormal; Notable for the following components:      Result Value   Glucose, Bld 101 (*)    Calcium 8.7 (*)    All other components within normal limits  URINALYSIS, COMPLETE (UACMP) WITH MICROSCOPIC - Abnormal; Notable for the following components:   Color, Urine STRAW (*)    APPearance CLEAR (*)    All other components within normal limits  CBC  TROPONIN I  CBG MONITORING, ED   ____________________________________________  EKG  ED ECG REPORT I, Loney Hering, the attending physician, personally viewed and interpreted this ECG.   Date: 12/02/2017  EKG Time: 2004  Rate:  60  Rhythm: normal sinus rhythm  Axis: normal  Intervals:none  ST&T Change: none  ____________________________________________  RADIOLOGY  ED MD interpretation:  CT head: No evidence of cortical infarction, no acute intracranial findings  MRI: No acute intracranial abnormality identified, minimal chronic microvascular ischemic changes of the brain.  Official radiology report(s): Ct Head Wo Contrast  Result Date: 12/02/2017 CLINICAL DATA:  Pt arrives POV with c/o left sided numbness within the last fifteen minutes. Pt is experiencing light headedness at this time with no neurologic defects EXAM: CT HEAD WITHOUT CONTRAST TECHNIQUE: Contiguous axial images were obtained from the base of the skull through the vertex without intravenous contrast. COMPARISON:  None. FINDINGS: Brain: No acute intracranial hemorrhage. No focal mass lesion. No CT evidence of acute infarction. No midline shift or mass effect. No hydrocephalus. Basilar  cisterns are patent. Vascular: No hyperdense vessel or unexpected calcification. Skull: Normal. Negative for fracture or focal lesion. Sinuses/Orbits: Paranasal sinuses and mastoid air cells are clear. Orbits are clear. Other: None. IMPRESSION: 1. No CT evidence of cortical infarction. 2. No acute intracranial findings. Electronically Signed   By: Suzy Bouchard M.D.   On: 12/02/2017 20:37   Mr Brain Wo Contrast  Result Date: 12/03/2017 CLINICAL DATA:  62 y/o  M; lightheaded with left-sided numbness. EXAM: MRI HEAD WITHOUT CONTRAST TECHNIQUE: Multiplanar, multiecho pulse sequences of the brain and surrounding structures were obtained without intravenous contrast. COMPARISON:  12/02/2017 CT head. FINDINGS: Brain: No acute infarction, hemorrhage, hydrocephalus, extra-axial collection or mass lesion. Few nonspecific foci of T2 FLAIR hyperintense signal abnormality in subcortical are compatible with minimal chronic microvascular ischemic changes for age. Vascular: Normal flow  voids. Skull and upper cervical spine: Normal marrow signal. Sinuses/Orbits: Negative. Other: None. IMPRESSION: 1. No acute intracranial abnormality identified. 2. Minimal chronic microvascular ischemic changes of the brain. Electronically Signed   By: Kristine Garbe M.D.   On: 12/03/2017 02:52    ____________________________________________   PROCEDURES  Procedure(s) performed: None  Procedures  Critical Care performed: No  ____________________________________________   INITIAL IMPRESSION / ASSESSMENT AND PLAN / ED COURSE  As part of my medical decision making, I reviewed the following data within the electronic MEDICAL RECORD NUMBER Notes from prior ED visits and [place] Controlled Substance Database   This is a 62 year old male who comes into the hospital today with some left-sided tingling to his face arm and legs as well as some mild left upper extremity weakness.  My differential diagnosis includes paresthesia, TIA, CVA, electrolyte abnormality.  We did check some blood work on the patient to include a CBC BMP urinalysis.  I will add a troponin to the patient's blood work and send him for an MRI for further evaluation of the symptoms. The patient has no complaints at this time. He will receive an aspirin.   The patient's MRI returned unremarkable.  I will encourage the patient to start taking some aspirin and he will be discharged and encouraged to follow-up with his primary care physician and neurology.      ____________________________________________   FINAL CLINICAL IMPRESSION(S) / ED DIAGNOSES  Final diagnoses:  Paresthesia  TIA (transient ischemic attack)     ED Discharge Orders    None       Note:  This document was prepared using Dragon voice recognition software and may include unintentional dictation errors.    Loney Hering, MD 12/03/17 [id number]

## 2017-12-03 NOTE — ED Notes (Signed)
Patient is sitting comfortably at this time with no signs of distress present. Equal, unlabored rise and fall of chest noted within normal rate. Family at bedside. Updated on wait. Will continue to monitor.

## 2017-12-03 NOTE — ED Notes (Signed)

## 2017-12-03 NOTE — ED Notes (Signed)
MRI states ETA 45-50 min

## 2017-12-14 ENCOUNTER — Ambulatory Visit (INDEPENDENT_AMBULATORY_CARE_PROVIDER_SITE_OTHER): Payer: BLUE CROSS/BLUE SHIELD | Admitting: Nurse Practitioner

## 2017-12-14 ENCOUNTER — Encounter: Payer: Self-pay | Admitting: Nurse Practitioner

## 2017-12-14 VITALS — BP 120/74 | HR 68 | Temp 98.2°F | Resp 18 | Ht 75.0 in | Wt 222.7 lb

## 2017-12-14 DIAGNOSIS — T148XXA Other injury of unspecified body region, initial encounter: Secondary | ICD-10-CM

## 2017-12-14 DIAGNOSIS — G459 Transient cerebral ischemic attack, unspecified: Secondary | ICD-10-CM | POA: Diagnosis not present

## 2017-12-14 DIAGNOSIS — E785 Hyperlipidemia, unspecified: Secondary | ICD-10-CM | POA: Diagnosis not present

## 2017-12-14 MED ORDER — ATORVASTATIN CALCIUM 40 MG PO TABS: 40.0000 mg | ORAL_TABLET | Freq: Every day | ORAL | 0 refills | Status: DC

## 2017-12-14 MED ORDER — TIZANIDINE HCL 4 MG PO TABS: 4.0000 mg | ORAL_TABLET | Freq: Once | ORAL | 0 refills | Status: DC

## 2017-12-14 NOTE — Patient Instructions (Signed)
- Take aspirin daily with full meal    Bad cholesterol, also called low-density lipoprotein (LDL), carries cholesterol and other fats that your liver makes to your body tissue. If it builds up in blood vessels, LDL can cause heart disease and other health problems. Your LDL level should be below 100. If you have diabetes or a possible heart problem, your LDL should be below 70.  Eat: Eat 20 to 30 grams of soluble fiber every day. Foods such as fruits and vegetables, whole grains, beans, peas, nuts, and seeds can help lower LDL. Avoid: Saturated fats (Dairy foods - such as butter, cream, ghee, regular-fat milk and cheese. Meat - such as fatty cuts of beef, pork and lamb, processed meats like salami, sausages and the skin on chicken. Lard., fatty snack foods, cakes, biscuits, pies and deep fried foods) Avoid smoking  Cholesterol Cholesterol is a fat. Your body needs a small amount of cholesterol. Cholesterol (plaque) may build up in your blood vessels (arteries). That makes you more likely to have a heart attack or stroke. You cannot feel your cholesterol level. Having a blood test is the only way to find out if your level is high. Keep your test results. Work with your doctor to keep your cholesterol at a good level. What do the results mean?  Total cholesterol is how much cholesterol is in your blood.  LDL is bad cholesterol. This is the type that can build up. Try to have low LDL.  HDL is good cholesterol. It cleans your blood vessels and carries LDL away. Try to have high HDL.  Triglycerides are fat that the body can store or burn for energy. What are good levels of cholesterol?  Total cholesterol below 200.  LDL below 100 is good for people who have health risks. LDL below 70 is good for people who have very high risks.  HDL above 40 is good. It is best to have HDL of 60 or higher.  Triglycerides below 150. How can I lower my cholesterol? Diet Follow your diet program as told by your  doctor.  Choose fish, white meat chicken, or [place] that is roasted or baked. Try not to eat red meat, fried foods, sausage, or lunch meats.  Eat lots of fresh fruits and vegetables.  Choose whole grains, beans, pasta, potatoes, and cereals.  Choose olive oil, corn oil, or canola oil. Only use small amounts.  Try not to eat butter, mayonnaise, shortening, or palm kernel oils.  Try not to eat foods with trans fats.  Choose low-fat or nonfat dairy foods. ? Drink skim or nonfat milk. ? Eat low-fat or nonfat yogurt and cheeses. ? Try not to drink whole milk or cream. ? Try not to eat ice cream, egg yolks, or full-fat cheeses.  Healthy desserts include angel food cake, ginger snaps, animal crackers, hard candy, popsicles, and low-fat or nonfat frozen yogurt. Try not to eat pastries, cakes, pies, and cookies.  Exercise Follow your exercise program as told by your doctor.  Be more active. Try gardening, walking, and taking the stairs.  Ask your doctor about ways that you can be more active.  Medicine  Take over-the-counter and prescription medicines only as told by your doctor. This information is not intended to replace advice given to you by your health care provider. Make sure you discuss any questions you have with your health care provider. Document Released: 10/15/2008 Document Revised: 02/18/2016 Document Reviewed: 01/29/2016 Elsevier Interactive Patient Education  [employer].

## 2017-12-14 NOTE — Progress Notes (Signed)
Name: Curtis Underwood   MRN: [id number]    DOB: 1955-09-19   Date:12/14/2017       Progress Note  Subjective  Chief Complaint  Chief Complaint  Patient presents with  . Follow-up    ER    HPI  Patient was seen on ED on 5/4 for left eye blurred vision, facial numbness and tingling of face and left arm and leg and mild weakness arm weakness. Symptoms improved in ER. Negative CT, No acute findings in  CBC, BMP, troponin or urinalysis. No acute changes in MRI- showed Minimal chronic microvascular ischemic changes of the brain. Patient was placed on daily 325mg  ASA.   + brother had stroke age 69 Since ER visit has not had any paresthesia, blurry vision, slurred speech, weakness or any previous symptoms. Patient does endorses mild headache- feels is due to sinus headache resolved with aleve and zyretec. States prior to episode had increase stress with job promotion and shift change.    Pt endorses chronic neck/shoulder strain has seen chiropractor years ago and has taken Zanaflex hasn't had any in a while and still sore. Patient Active Problem List   Diagnosis Date Noted  . Acid reflux 05/26/2015  . Allergic rhinitis 05/26/2015  . Dyslipidemia 05/26/2015  . Smokes cigars 05/26/2015  . Obstructive sleep apnea 05/26/2015  . PND (post-nasal drip) 05/26/2015  . Neck stiffness 05/26/2015    Past Medical History:  Diagnosis Date  . Hyperlipidemia     Past Surgical History:  Procedure Laterality Date  . APPENDECTOMY      Social History   Tobacco Use  . Smoking status: Current Some Day Smoker    Types: Cigars  . Smokeless tobacco: Never Used  Substance Use Topics  . Alcohol use: Yes    Alcohol/week: 0.0 oz    Comment: occasionally     Current Outpatient Medications:  .  atorvastatin (LIPITOR) 40 MG tablet, Take 1 tablet (40 mg total) by mouth daily at 6 PM., Disp: 90 tablet, Rfl: 0 .  Cetirizine HCl (ZYRTEC ALLERGY) 10 MG CAPS, Take by mouth., Disp: , Rfl:  .  famotidine  (PEPCID) 20 MG tablet, Take by mouth as needed. , Disp: , Rfl:  .  fluticasone (FLONASE) 50 MCG/ACT nasal spray, Place 2 sprays into both nostrils daily., Disp: 16 g, Rfl: 0 .  tiZANidine (ZANAFLEX) 4 MG tablet, Take 1 tablet (4 mg total) by mouth once for 1 dose., Disp: 30 tablet, Rfl: 0  No Known Allergies  ROS  Constitutional: Negative for fever or weight change.  Respiratory: Negative for cough and shortness of breath.   Cardiovascular: Negative for chest pain or palpitations.  Gastrointestinal: Negative for abdominal pain, no bowel changes.  Musculoskeletal: Negative for gait problem or joint swelling.  Skin: Negative for rash.  Neurological: Negative for dizziness Postive headache.  No other specific complaints in a complete review of systems (except as listed in HPI above).  Objective  Vitals:   12/14/17 1554  BP: 120/74  Pulse: 68  Resp: 18  Temp: 98.2 F (36.8 C)  TempSrc: Oral  SpO2: 97%  Weight: 222 lb 11.2 oz (101 kg)  Height: 6\' 3"  (1.905 m)     Body mass index is 27.84 kg/m.  Nursing Note and Vital Signs reviewed.  Physical Exam   Constitutional: Patient appears well-developed and well-nourished. No distress.  Cardiovascular: Normal rate, regular rhythm, S1/S2 present.  No murmur or rub heard.  Pulmonary/Chest: Effort normal and breath sounds clear. No respiratory  distress or retractions. Neurological: a&ox3, PERRLA, no nystagmus, no facial droop upper or lower extremity weakness, normal RAM, no limb ataxia, sensation intact, no slurred speech or aphasia, DTRs normal, steady gait. MSK: full shoulder ROM, no obvious deformities noted.  Psychiatric: Patient has a normal mood and affect. behavior is normal. Judgment and thought content normal.  No results found for this or any previous visit (from the past 72 hour(s)).  Assessment & Plan  1. TIA (transient ischemic attack) - return to ER with return of symptoms - BP well controlled - Discussed stress  reduction - Increase statin for increase control of lipids, discussed diet - Ambulatory referral to Neurology  2. Dyslipidemia -discussed diet  - atorvastatin (LIPITOR) 40 MG tablet; Take 1 tablet (40 mg total) by mouth daily at 6 PM.  Dispense: 90 tablet; Refill: 0  3. Muscle strain - Rest, heat/ice, stretching, proper body mechanics  - tiZANidine (ZANAFLEX) 4 MG tablet; Take 1 tablet (4 mg total) by mouth once for 1 dose.  Dispense: 30 tablet; Refill: 0    -Red flags and when to present for emergency care or RTC including fever >101.63F, chest pain, shortness of breath, new/worsening/un-resolving symptoms, return of stroke symptoms reviewed with patient at time of visit. Follow up and care instructions discussed and provided in AVS.

## 2018-01-27 ENCOUNTER — Other Ambulatory Visit: Payer: Self-pay | Admitting: Nurse Practitioner

## 2018-01-27 DIAGNOSIS — T148XXA Other injury of unspecified body region, initial encounter: Secondary | ICD-10-CM

## 2018-02-06 ENCOUNTER — Ambulatory Visit: Payer: Self-pay | Admitting: Family Medicine

## 2018-03-16 ENCOUNTER — Ambulatory Visit: Payer: BLUE CROSS/BLUE SHIELD | Admitting: Family Medicine

## 2018-03-23 ENCOUNTER — Other Ambulatory Visit: Payer: Self-pay | Admitting: Nurse Practitioner

## 2018-03-23 ENCOUNTER — Telehealth: Payer: Self-pay | Admitting: Nurse Practitioner

## 2018-03-23 DIAGNOSIS — E785 Hyperlipidemia, unspecified: Secondary | ICD-10-CM

## 2018-03-23 NOTE — Telephone Encounter (Signed)
Patient needs to come in for appointment with PCP in the next 2 weeks for refills. Once appointment has been made let me know and I can provide bridge medication.

## 2018-03-23 NOTE — Telephone Encounter (Signed)
Left voice message informing pt to return call to schedule appt.

## 2018-04-14 ENCOUNTER — Encounter: Payer: Self-pay | Admitting: Family Medicine

## 2018-04-14 ENCOUNTER — Ambulatory Visit (INDEPENDENT_AMBULATORY_CARE_PROVIDER_SITE_OTHER): Payer: BLUE CROSS/BLUE SHIELD | Admitting: Family Medicine

## 2018-04-14 VITALS — BP 122/80 | HR 80 | Temp 98.2°F | Resp 18 | Ht 75.0 in | Wt 224.2 lb

## 2018-04-14 DIAGNOSIS — E663 Overweight: Secondary | ICD-10-CM

## 2018-04-14 DIAGNOSIS — K219 Gastro-esophageal reflux disease without esophagitis: Secondary | ICD-10-CM | POA: Diagnosis not present

## 2018-04-14 DIAGNOSIS — J302 Other seasonal allergic rhinitis: Secondary | ICD-10-CM

## 2018-04-14 DIAGNOSIS — F17290 Nicotine dependence, other tobacco product, uncomplicated: Secondary | ICD-10-CM

## 2018-04-14 DIAGNOSIS — M436 Torticollis: Secondary | ICD-10-CM

## 2018-04-14 DIAGNOSIS — Z8673 Personal history of transient ischemic attack (TIA), and cerebral infarction without residual deficits: Secondary | ICD-10-CM | POA: Diagnosis not present

## 2018-04-14 DIAGNOSIS — Z23 Encounter for immunization: Secondary | ICD-10-CM | POA: Diagnosis not present

## 2018-04-14 DIAGNOSIS — K130 Diseases of lips: Secondary | ICD-10-CM

## 2018-04-14 DIAGNOSIS — E785 Hyperlipidemia, unspecified: Secondary | ICD-10-CM

## 2018-04-14 MED ORDER — TIZANIDINE HCL 4 MG PO CAPS: 4.0000 mg | ORAL_CAPSULE | Freq: Two times a day (BID) | ORAL | 0 refills | Status: DC

## 2018-04-14 MED ORDER — OMEPRAZOLE 20 MG PO CPDR: 20.0000 mg | DELAYED_RELEASE_CAPSULE | Freq: Every day | ORAL | 0 refills | Status: DC

## 2018-04-14 MED ORDER — CETIRIZINE HCL 10 MG PO CAPS: 10.0000 mg | ORAL_CAPSULE | Freq: Every day | ORAL | 0 refills | Status: DC

## 2018-04-14 MED ORDER — FLUTICASONE PROPIONATE 50 MCG/ACT NA SUSP: 2.0000 | Freq: Every day | NASAL | 2 refills | Status: DC

## 2018-04-14 MED ORDER — ATORVASTATIN CALCIUM 40 MG PO TABS: 40.0000 mg | ORAL_TABLET | Freq: Every day | ORAL | 0 refills | Status: DC

## 2018-04-14 NOTE — Progress Notes (Signed)
Name: Curtis Underwood   MRN: [id number]    DOB: 05-29-1956   Date:04/14/2018       Progress Note  Subjective  Chief Complaint  Chief Complaint  Patient presents with  . Hyperlipidemia  . Medication Refill    HPI  Overweight: He notes diet has not been as good as it should be; he has not been exercising because it is so hot out - will try get back to walking once the weather cools off (prefers to walk after work). Does not eat out frequently. He makes a goal weight of #215.  GERD: Spicy food, red sauce, onions is a trigger; taking omeprazole or famotadine PRN.  Discussed risk of long-term PPI use. No dysphagia, denies chest pain, abdominal pain, nausea, vomiting, blood in stool, or dark and tarry stools  AR: Uses flonase and zyrtec as needed for seasonal allergic rhinitis. Usually worse in the fall and spring.  Needs refills today.  Bottom Lip lesion: He smokes cigars on the weekends, has never smoked cigarettes or used chewing tobacco.  He noticed a small white lesion on the center/right of his lower lip today after seeing some blood pooling in the same area for a few days and having some tingling in the area for a few months.  Smoking: Smokes cigars on the weekends; discussed cessation x2 minutes and he is not ready to quit at this time.  Hx TIA: He was referred to neurologist but didn't go, he does not want to go now due to being too busy.  Has not had any more episodes.  He is taking lipitor and 81mg  ASA daily.  He works a lot, but stress has been decreased (He works for [employer] in [place])  Hyperlipidemia: Taking lipitor 40mg  (has history of myalgias with other statins), no myalgias today, denies chest pains, shortness of breath. ECG is on file.  Muscle Strain/Neck Stiffness: On the computer a lot at work, neck muscles get stiff, shoulders are stiff and sometimes have spasm, sometimes it gives him a headaches.  Taking tizanidine 1-2 times a week  Patient Active Problem List   Diagnosis Date Noted  . Acid reflux 05/26/2015  . Allergic rhinitis 05/26/2015  . Dyslipidemia 05/26/2015  . Smokes cigars 05/26/2015  . Obstructive sleep apnea 05/26/2015  . PND (post-nasal drip) 05/26/2015  . Neck stiffness 05/26/2015    Past Surgical History:  Procedure Laterality Date  . APPENDECTOMY      Family History  Adopted: Yes  Family history unknown: Yes    Social History   Socioeconomic History  . Marital status: Married    Spouse name: Not on file  . Number of children: Not on file  . Years of education: Not on file  . Highest education level: Not on file  Occupational History  . Not on file  Social Needs  . Financial resource strain: Not on file  . Food insecurity:    Worry: Not on file    Inability: Not on file  . Transportation needs:    Medical: Not on file    Non-medical: Not on file  Tobacco Use  . Smoking status: Current Some Day Smoker    Types: Cigars  . Smokeless tobacco: Never Used  Substance and Sexual Activity  . Alcohol use: Yes    Alcohol/week: 0.0 standard drinks    Comment: occasionally  . Drug use: No  . Sexual activity: Yes    Partners: Female  Lifestyle  . Physical activity:  Days per week: Not on file    Minutes per session: Not on file  . Stress: Not on file  Relationships  . Social connections:    Talks on phone: Not on file    Gets together: Not on file    Attends religious service: Not on file    Active member of club or organization: Not on file    Attends meetings of clubs or organizations: Not on file    Relationship status: Not on file  . Intimate partner violence:    Fear of current or ex partner: Not on file    Emotionally abused: Not on file    Physically abused: Not on file    Forced sexual activity: Not on file  Other Topics Concern  . Not on file  Social History Narrative  . Not on file     Current Outpatient Medications:  .  atorvastatin (LIPITOR) 40 MG tablet, TAKE 1 TABLET (40 MG TOTAL) BY  MOUTH DAILY AT 6 PM., Disp: 90 tablet, Rfl: 0 .  Cetirizine HCl (ZYRTEC ALLERGY) 10 MG CAPS, Take by mouth., Disp: , Rfl:  .  famotidine (PEPCID) 20 MG tablet, Take by mouth as needed. , Disp: , Rfl:  .  fluticasone (FLONASE) 50 MCG/ACT nasal spray, Place 2 sprays into both nostrils daily., Disp: 16 g, Rfl: 0 .  tiZANidine (ZANAFLEX) 4 MG capsule, Take 4 mg by mouth 2 (two) times daily., Disp: , Rfl:   No Known Allergies  I personally reviewed active problem list, medication list, allergies, family history, social history, health maintenance, notes from last encounter, lab results with the patient/caregiver today.   ROS Constitutional: Negative for fever or weight change.  Respiratory: Negative for cough and shortness of breath.   Cardiovascular: Negative for chest pain or palpitations.  Gastrointestinal: Negative for abdominal pain, no bowel changes.  Musculoskeletal: Negative for gait problem or joint swelling.  Skin: Negative for rash.  Neurological: Negative for dizziness or headache.  No other specific complaints in a complete review of systems (except as listed in HPI above).  Objective  Vitals:   04/14/18 1459  BP: 122/80  Pulse: 80  Resp: 18  Temp: 98.2 F (36.8 C)  TempSrc: Oral  SpO2: 99%  Weight: 224 lb 3.2 oz (101.7 kg)  Height: 6\' 3"  (1.905 m)    Body mass index is 28.02 kg/m.  Physical Exam Constitutional: Patient appears well-developed and well-nourished. No distress.  HENT: Head: Normocephalic and atraumatic. Ears: bilateral TMs with no erythema or effusion; Nose: Nose normal. Mouth/Throat: Oropharynx is clear and moist. No oropharyngeal exudate or tonsillar swelling.  Eyes: Conjunctivae and EOM are normal. No scleral icterus.  Pupils are equal, round, and reactive to light.  Neck: Normal range of motion. Neck supple. No JVD present. No thyromegaly present.  Cardiovascular: Normal rate, regular rhythm and normal heart sounds.  No murmur heard. No BLE  edema. Pulmonary/Chest: Effort normal and breath sounds normal. No respiratory distress. Abdominal: Soft. Bowel sounds are normal, no distension. There is no tenderness. No masses. Musculoskeletal: Normal range of motion, no joint effusions. No gross deformities Neurological: Pt is alert and oriented to person, place, and time. No cranial nerve deficit. Coordination, balance, strength, speech and gait are normal.  Skin: Skin is warm and dry. No rash noted. No erythema.  Bottom left lip is nontender, nonerythematous, no blood present, no exudate present.  There is a small oblong white lesion with a central clearing to the left-center of the lip.  No other oral lesions are present.  Psychiatric: Patient has a normal mood and affect. behavior is normal. Judgment and thought content normal.  PHQ2/9: Depression screen [facility] 2/9 04/14/2018 08/09/2017 12/16/2016 11/16/2016 05/26/2015  Decreased Interest 0 0 0 0 0  Down, Depressed, Hopeless 0 0 0 0 0  PHQ - 2 Score 0 0 0 0 0  Altered sleeping 0 - - - -  Tired, decreased energy 0 - - - -  Change in appetite 0 - - - -  Feeling bad or failure about yourself  0 - - - -  Trouble concentrating 0 - - - -  Moving slowly or fidgety/restless 0 - - - -  Suicidal thoughts 0 - - - -  PHQ-9 Score 0 - - - -  Difficult doing work/chores Not difficult at all - - - -     Fall Risk: Fall Risk  04/14/2018 08/09/2017 12/16/2016 11/16/2016 05/26/2015  Falls in the past year? No No No No No   Assessment & Plan  1. Gastroesophageal reflux disease without esophagitis - Discussed triggers; discussed long-term PPI use, taking prilosec PRN - omeprazole (PRILOSEC) 20 MG capsule; Take 1 capsule (20 mg total) by mouth daily.  Dispense: 30 capsule; Refill: 0  2. Seasonal allergic rhinitis, unspecified trigger - Cetirizine HCl (ZYRTEC ALLERGY) 10 MG CAPS; Take 1 capsule (10 mg total) by mouth daily.  Dispense: 30 capsule; Refill: 0 - fluticasone (FLONASE) 50 MCG/ACT nasal spray;  Place 2 sprays into both nostrils daily.  Dispense: 16 g; Refill: 2  3. Smokes cigars - Cessation discussed, pt not ready  4. History of transient ischemic attack (TIA) - Stable; no recent episodes; declines neurology referral  5. Dyslipidemia - atorvastatin (LIPITOR) 40 MG tablet; Take 1 tablet (40 mg total) by mouth daily at 6 PM.  Dispense: 90 tablet; Refill: 0  6. Needs flu shot - Flu Vaccine QUAD 6+ mos PF IM (Fluarix Quad PF)  7. Neck stiffness - tiZANidine (ZANAFLEX) 4 MG capsule; Take 1 capsule (4 mg total) by mouth 2 (two) times daily.  Dispense: 30 capsule; Refill: 0  8. Overweight (BMI 25.0-29.9) -Discussed lifestyle management including finding time to exercise. - Discussed importance of 150 minutes of physical activity weekly, eat two servings of fish weekly, eat one serving of tree nuts ( cashews, pistachios, pecans, almonds.Marland Kitchen) every other day, eat 6 servings of fruit/vegetables daily and drink plenty of water and avoid sweet beverages.   9. Lesion of lip Advised to monitor lesion and if not resolved in 1 to 2 weeks return to clinic for further evaluation and consideration of referral to specialist.  He is at increased risk for oral cancers due to cigar use on the weekends.  It is unclear at this point if lesion is herpetic in nature vs.  Apthous ulcer vs. cancerous lesion.  Discussed differential with patient, he verbalizes understanding, and will return to clinic if not improving.

## 2018-04-14 NOTE — Patient Instructions (Addendum)
Call our office back or send me a message on Mychart in about 2 weeks if the lesion on your lower lip is not better.  To help with reflux: - Elevate your head of bed - either with a few extra pillows, a wedge pillow, or by placing two bed risers under the head of bed posts. - Avoid the following foods: citrus, fatty foods, chocolate, peppermint, and excessive alcohol, along with sodas, orange juice (acidic drinks) - Stop eating at least 3 hours before going to bed, minimize naps/laying down after eating. - No smoking. - If you are overweight or obese, exercising and losing weight will also help your symptoms. - Caution: prolonged use of proton pump inhibitors like omeprazole (Prilosec), pantoprazole (Protonix), esomeprazole (Nexium), and others like Dexilant and Aciphex may increase your risk of pneumonia, Clostridium difficile colitis, osteoporosis, anemia and other health complications

## 2018-04-30 ENCOUNTER — Other Ambulatory Visit: Payer: Self-pay | Admitting: Family Medicine

## 2018-04-30 DIAGNOSIS — M436 Torticollis: Secondary | ICD-10-CM

## 2018-05-01 NOTE — Telephone Encounter (Signed)
Please find out if Tizanidine Rx went through on 04/14/2018.  If not, I will refill, if it did go through, then it is too early to refill.

## 2018-05-06 ENCOUNTER — Other Ambulatory Visit: Payer: Self-pay | Admitting: Family Medicine

## 2018-05-06 DIAGNOSIS — E785 Hyperlipidemia, unspecified: Secondary | ICD-10-CM

## 2018-05-06 DIAGNOSIS — K219 Gastro-esophageal reflux disease without esophagitis: Secondary | ICD-10-CM

## 2018-05-06 DIAGNOSIS — M436 Torticollis: Secondary | ICD-10-CM

## 2018-05-08 NOTE — Telephone Encounter (Signed)
Please call patient - according to last visit he was taking omeprazole and tizanidine PRN, not daily, so these should not be due yet; atorvastatin was 90-day supply, so not due for another 2 months.  Please clarify how often he is taking the omeprazole and tizanidine.

## 2018-05-23 ENCOUNTER — Other Ambulatory Visit: Payer: Self-pay | Admitting: Family Medicine

## 2018-05-23 DIAGNOSIS — M436 Torticollis: Secondary | ICD-10-CM

## 2018-05-23 DIAGNOSIS — J302 Other seasonal allergic rhinitis: Secondary | ICD-10-CM

## 2018-05-23 DIAGNOSIS — K219 Gastro-esophageal reflux disease without esophagitis: Secondary | ICD-10-CM

## 2018-05-23 DIAGNOSIS — E785 Hyperlipidemia, unspecified: Secondary | ICD-10-CM

## 2018-05-23 NOTE — Telephone Encounter (Signed)
Patient is calling back in regards to this. He states he has 7 pills of Tizanidine and he takes them just when needed but does not want to run out. Please advise.

## 2018-05-24 MED ORDER — ATORVASTATIN CALCIUM 40 MG PO TABS: 40.0000 mg | ORAL_TABLET | Freq: Every day | ORAL | 1 refills | Status: DC

## 2018-05-24 MED ORDER — TIZANIDINE HCL 4 MG PO CAPS: 4.0000 mg | ORAL_CAPSULE | Freq: Two times a day (BID) | ORAL | 0 refills | Status: DC

## 2018-05-24 MED ORDER — OMEPRAZOLE 20 MG PO CPDR: 20.0000 mg | DELAYED_RELEASE_CAPSULE | Freq: Every day | ORAL | 0 refills | Status: DC

## 2018-05-24 MED ORDER — CETIRIZINE HCL 10 MG PO CAPS: 10.0000 mg | ORAL_CAPSULE | Freq: Every day | ORAL | 3 refills | Status: DC

## 2018-05-24 NOTE — Telephone Encounter (Signed)
Refill Request for Cholesterol medication. Atorvastatin 40 mg  Last physical: 11/16/2016  Lab Results  Component Value Date   CHOL 185 11/04/2017   HDL 45 11/04/2017   LDLCALC 118 (H) 11/04/2017   TRIG 113 11/04/2017   CHOLHDL 4.1 11/04/2017    Follow up:   07/14/2018  Refill request for general medication: Zyrtec 10 mg Omeprazole 20 mg Zanaflex 4 mg  Last office visit: 04/14/2018  Last physical exam: 11/16/2016  No follow-ups on file. 07/14/2018

## 2018-05-31 ENCOUNTER — Telehealth: Payer: Self-pay | Admitting: Emergency Medicine

## 2018-05-31 NOTE — Telephone Encounter (Signed)
We did not discuss this at his appointment yesterday.  Please find out when his last sleep study was and whether he has used CPAP in the past. We will likely need to refer for evaluation by Pulmonology.

## 2018-05-31 NOTE — Telephone Encounter (Signed)
Please advise. I did not see a referral

## 2018-05-31 NOTE — Telephone Encounter (Signed)
Copied from [place] [id number]. Topic: Quick Communication - See Telephone Encounter >> May 30, 2018  4:53 PM Antonieta Iba C wrote: CRM for notification. See Telephone encounter for: 05/30/18.  Pt and spouse called in to follow up and be advised. Pt says that someone was suppose to get back with him about ordering a C-Pap machine. Pt says that he hasn't received a call back, not showing notes in chart about this.   Please advise.   [phone]

## 2018-06-06 NOTE — Telephone Encounter (Signed)
Left message for patient, asked him to call office with name of who did his sleep study and where he was getting his supplies from so we can order.

## 2018-06-07 ENCOUNTER — Other Ambulatory Visit: Payer: Self-pay | Admitting: Family Medicine

## 2018-06-07 DIAGNOSIS — K219 Gastro-esophageal reflux disease without esophagitis: Secondary | ICD-10-CM

## 2018-06-09 ENCOUNTER — Other Ambulatory Visit: Payer: Self-pay | Admitting: Family Medicine

## 2018-06-09 DIAGNOSIS — K219 Gastro-esophageal reflux disease without esophagitis: Secondary | ICD-10-CM

## 2018-06-11 NOTE — Telephone Encounter (Signed)
Please check with pharmacy - I sent in 90-day supply on 05/24/18 for omeprazole. If they didn't receive, please give verbal for refill

## 2018-06-21 ENCOUNTER — Other Ambulatory Visit: Payer: Self-pay | Admitting: Family Medicine

## 2018-06-21 DIAGNOSIS — M436 Torticollis: Secondary | ICD-10-CM

## 2018-06-23 ENCOUNTER — Encounter: Payer: Self-pay | Admitting: Family Medicine

## 2018-06-23 ENCOUNTER — Ambulatory Visit (INDEPENDENT_AMBULATORY_CARE_PROVIDER_SITE_OTHER): Payer: Managed Care, Other (non HMO) | Admitting: Family Medicine

## 2018-06-23 VITALS — BP 130/82 | HR 80 | Temp 98.3°F | Resp 16 | Ht 75.0 in | Wt 224.0 lb

## 2018-06-23 DIAGNOSIS — J0140 Acute pansinusitis, unspecified: Secondary | ICD-10-CM | POA: Diagnosis not present

## 2018-06-23 DIAGNOSIS — J302 Other seasonal allergic rhinitis: Secondary | ICD-10-CM | POA: Diagnosis not present

## 2018-06-23 MED ORDER — CETIRIZINE HCL 10 MG PO CAPS
10.0000 mg | ORAL_CAPSULE | Freq: Every day | ORAL | 3 refills | Status: DC
Start: 2018-06-23 — End: 2018-06-23

## 2018-06-23 MED ORDER — AMOXICILLIN-POT CLAVULANATE 875-125 MG PO TABS: 1.0000 | ORAL_TABLET | Freq: Two times a day (BID) | ORAL | 0 refills | Status: AC

## 2018-06-23 MED ORDER — CETIRIZINE HCL 10 MG PO CAPS: 10.0000 mg | ORAL_CAPSULE | Freq: Every day | ORAL | 3 refills | Status: DC

## 2018-06-23 NOTE — Patient Instructions (Signed)

## 2018-06-23 NOTE — Progress Notes (Signed)
Name: Curtis Underwood   MRN: [id number]    DOB: 23-Jun-1956   Date:06/23/2018       Progress Note  Subjective  Chief Complaint  Chief Complaint  Patient presents with  . Sinusitis    cough, congested, headache, facial pressure for 1 week    HPI  PT presents with 1.5 weeks of sinus congestion, facial pressure, and a slight pain in the right mid back with deep inspiration.  Denies chest pain, shortness of breath, fevers/chills, malaise. Taking dayquil, flonase, and zyrtec with minimal relief.  Patient Active Problem List   Diagnosis Date Noted  . History of transient ischemic attack (TIA) 04/14/2018  . Acid reflux 05/26/2015  . Allergic rhinitis 05/26/2015  . Dyslipidemia 05/26/2015  . Smokes cigars 05/26/2015  . Obstructive sleep apnea 05/26/2015  . PND (post-nasal drip) 05/26/2015  . Neck stiffness 05/26/2015    Social History   Tobacco Use  . Smoking status: Current Some Day Smoker    Types: Cigars  . Smokeless tobacco: Never Used  Substance Use Topics  . Alcohol use: Yes    Alcohol/week: 0.0 standard drinks    Comment: occasionally     Current Outpatient Medications:  .  atorvastatin (LIPITOR) 40 MG tablet, Take 1 tablet (40 mg total) by mouth daily at 6 PM., Disp: 90 tablet, Rfl: 1 .  Cetirizine HCl (ZYRTEC ALLERGY) 10 MG CAPS, Take 1 capsule (10 mg total) by mouth daily., Disp: 90 capsule, Rfl: 3 .  famotidine (PEPCID) 20 MG tablet, Take by mouth as needed. , Disp: , Rfl:  .  fluticasone (FLONASE) 50 MCG/ACT nasal spray, Place 2 sprays into both nostrils daily., Disp: 16 g, Rfl: 2 .  omeprazole (PRILOSEC) 20 MG capsule, Take 1 capsule (20 mg total) by mouth daily., Disp: 90 capsule, Rfl: 0 .  tiZANidine (ZANAFLEX) 4 MG capsule, Take 1 capsule (4 mg total) by mouth 2 (two) times daily., Disp: 30 capsule, Rfl: 0  No Known Allergies  I personally reviewed active problem list, medication list, allergies, lab results with the patient/caregiver today.  ROS  Ten  systems reviewed and is negative except as mentioned in HPI  Objective  Vitals:   06/23/18 1106  BP: 130/82  Pulse: 80  Resp: 16  Temp: 98.3 F (36.8 C)  TempSrc: Oral  SpO2: 99%  Weight: 224 lb (101.6 kg)  Height: 6\' 3"  (1.905 m)   Body mass index is 28 kg/m.  Nursing Note and Vital Signs reviewed.  Physical Exam  Constitutional: Patient appears well-developed and well-nourished. No distress.  HEENT: head atraumatic, normocephalic, pupils equal and reactive to light, Bilateral TM's without erythema or effusion,  bilateral maxillary and frontal sinuses are tender, neck supple with mild submandibular lymphadenopathy, throat with PND, no erythema or exudate, no tonsillar swelling Cardiovascular: Normal rate, regular rhythm and normal heart sounds.  No murmur heard. No BLE edema. Pulmonary/Chest: Effort normal and breath sounds clear bilaterally except for RLL with very slight rhonchi present. No respiratory distress. Abdominal: Soft, bowel sounds normal, there is no tenderness, no HSM Psychiatric: Patient has a normal mood and affect. behavior is normal. Judgment and thought content normal.  No results found for this or any previous visit (from the past 72 hour(s)).  Assessment & Plan  1. Acute non-recurrent pansinusitis - amoxicillin-clavulanate (AUGMENTIN) 875-125 MG tablet; Take 1 tablet by mouth 2 (two) times daily for 10 days.  Dispense: 20 tablet; Refill: 0  2. Seasonal allergic rhinitis, unspecified trigger - Cetirizine HCl (  ZYRTEC ALLERGY) 10 MG CAPS; Take 1 capsule (10 mg total) by mouth daily.  Dispense: 90 capsule; Refill: 3  -Red flags and when to present for emergency care or RTC including fever >101.54F, chest pain, shortness of breath, new/worsening/un-resolving symptoms, reviewed with patient at time of visit. Follow up and care instructions discussed and provided in AVS.

## 2018-06-29 ENCOUNTER — Other Ambulatory Visit: Payer: Self-pay | Admitting: Family Medicine

## 2018-06-29 DIAGNOSIS — K219 Gastro-esophageal reflux disease without esophagitis: Secondary | ICD-10-CM

## 2018-07-07 NOTE — Telephone Encounter (Signed)
Spoke with pharmacy and they state they never received it per [name] ok to verbalize refill.

## 2018-07-07 NOTE — Telephone Encounter (Signed)
This medication was received by the pharmacy per pharmacist.

## 2018-07-14 ENCOUNTER — Ambulatory Visit: Payer: BLUE CROSS/BLUE SHIELD | Admitting: Family Medicine

## 2018-07-24 ENCOUNTER — Ambulatory Visit (INDEPENDENT_AMBULATORY_CARE_PROVIDER_SITE_OTHER): Payer: Managed Care, Other (non HMO) | Admitting: Family Medicine

## 2018-07-24 ENCOUNTER — Encounter: Payer: Self-pay | Admitting: Family Medicine

## 2018-07-24 VITALS — BP 122/78 | HR 72 | Temp 98.1°F | Ht 74.0 in | Wt 221.2 lb

## 2018-07-24 DIAGNOSIS — Z131 Encounter for screening for diabetes mellitus: Secondary | ICD-10-CM | POA: Insufficient documentation

## 2018-07-24 DIAGNOSIS — F17290 Nicotine dependence, other tobacco product, uncomplicated: Secondary | ICD-10-CM | POA: Diagnosis not present

## 2018-07-24 DIAGNOSIS — G4733 Obstructive sleep apnea (adult) (pediatric): Secondary | ICD-10-CM | POA: Diagnosis not present

## 2018-07-24 DIAGNOSIS — E785 Hyperlipidemia, unspecified: Secondary | ICD-10-CM

## 2018-07-24 DIAGNOSIS — Z0000 Encounter for general adult medical examination without abnormal findings: Secondary | ICD-10-CM | POA: Diagnosis not present

## 2018-07-24 DIAGNOSIS — Z114 Encounter for screening for human immunodeficiency virus [HIV]: Secondary | ICD-10-CM

## 2018-07-24 DIAGNOSIS — M436 Torticollis: Secondary | ICD-10-CM

## 2018-07-24 DIAGNOSIS — Z125 Encounter for screening for malignant neoplasm of prostate: Secondary | ICD-10-CM

## 2018-07-24 DIAGNOSIS — E559 Vitamin D deficiency, unspecified: Secondary | ICD-10-CM

## 2018-07-24 DIAGNOSIS — Z1159 Encounter for screening for other viral diseases: Secondary | ICD-10-CM

## 2018-07-24 DIAGNOSIS — M5412 Radiculopathy, cervical region: Secondary | ICD-10-CM

## 2018-07-24 DIAGNOSIS — E663 Overweight: Secondary | ICD-10-CM | POA: Insufficient documentation

## 2018-07-24 DIAGNOSIS — Z23 Encounter for immunization: Secondary | ICD-10-CM | POA: Diagnosis not present

## 2018-07-24 MED ORDER — PREDNISONE 5 MG (48) PO TBPK: ORAL_TABLET | ORAL | 0 refills | Status: DC

## 2018-07-24 MED ORDER — TIZANIDINE HCL 4 MG PO CAPS: 4.0000 mg | ORAL_CAPSULE | Freq: Two times a day (BID) | ORAL | 1 refills | Status: DC

## 2018-07-24 NOTE — Patient Instructions (Signed)
Preventive Care 40-64 Years, Male Preventive care refers to lifestyle choices and visits with your health care provider that can promote health and wellness. What does preventive care include?   A yearly physical exam. This is also called an annual well check.  Dental exams once or twice a year.  Routine eye exams. Ask your health care provider how often you should have your eyes checked.  Personal lifestyle choices, including: ? Daily care of your teeth and gums. ? Regular physical activity. ? Eating a healthy diet. ? Avoiding tobacco and drug use. ? Limiting alcohol use. ? Practicing safe sex. ? Taking low-dose aspirin every day starting at age 50. What happens during an annual well check? The services and screenings done by your health care provider during your annual well check will depend on your age, overall health, lifestyle risk factors, and family history of disease. Counseling Your health care provider may ask you questions about your:  Alcohol use.  Tobacco use.  Drug use.  Emotional well-being.  Home and relationship well-being.  Sexual activity.  Eating habits.  Work and work environment. Screening You may have the following tests or measurements:  Height, weight, and BMI.  Blood pressure.  Lipid and cholesterol levels. These may be checked every 5 years, or more frequently if you are over 50 years old.  Skin check.  Lung cancer screening. You may have this screening every year starting at age 55 if you have a 30-pack-year history of smoking and currently smoke or have quit within the past 15 years.  Colorectal cancer screening. All adults should have this screening starting at age 50 and continuing until age 75. Your health care provider may recommend screening at age 45. You will have tests every 1-10 years, depending on your results and the type of screening test. People at increased risk should start screening at an earlier age. Screening tests may  include: ? Guaiac-based fecal occult blood testing. ? Fecal immunochemical test (FIT). ? Stool DNA test. ? Virtual colonoscopy. ? Sigmoidoscopy. During this test, a flexible tube with a tiny camera (sigmoidoscope) is used to examine your rectum and lower colon. The sigmoidoscope is inserted through your anus into your rectum and lower colon. ? Colonoscopy. During this test, a long, thin, flexible tube with a tiny camera (colonoscope) is used to examine your entire colon and rectum.  Prostate cancer screening. Recommendations will vary depending on your family history and other risks.  Hepatitis C blood test.  Hepatitis B blood test.  Sexually transmitted disease (STD) testing.  Diabetes screening. This is done by checking your blood sugar (glucose) after you have not eaten for a while (fasting). You may have this done every 1-3 years. Discuss your test results, treatment options, and if necessary, the need for more tests with your health care provider. Vaccines Your health care provider may recommend certain vaccines, such as:  Influenza vaccine. This is recommended every year.  Tetanus, diphtheria, and acellular pertussis (Tdap, Td) vaccine. You may need a Td booster every 10 years.  Varicella vaccine. You may need this if you have not been vaccinated.  Zoster vaccine. You may need this after age 60.  Measles, mumps, and rubella (MMR) vaccine. You may need at least one dose of MMR if you were born in 1956 or later. You may also need a second dose.  Pneumococcal 13-valent conjugate (PCV13) vaccine. You may need this if you have certain conditions and have not been vaccinated.  Pneumococcal polysaccharide (PPSV23) vaccine.   and rubella (MMR) vaccine. You may need at least one dose of MMR if you were born in 1957 or later. You may also need a second dose.   Pneumococcal 13-valent conjugate (PCV13) vaccine. You may need this if you have certain conditions and have not been vaccinated.   Pneumococcal polysaccharide (PPSV23) vaccine. You may need one or two doses if you smoke cigarettes or if you have certain conditions.   Meningococcal vaccine. You may need this if you have certain conditions.   Hepatitis A vaccine. You may need this if you have certain conditions or if you travel or work in  places where you may be exposed to hepatitis A.   Hepatitis B vaccine. You may need this if you have certain conditions or if you travel or work in places where you may be exposed to hepatitis B.   Haemophilus influenzae type b (Hib) vaccine. You may need this if you have certain risk factors.  Talk to your health care provider about which screenings and vaccines you need and how often you need them.  This information is not intended to replace advice given to you by your health care provider. Make sure you discuss any questions you have with your health care provider.  Document Released: 08/15/2015 Document Revised: 09/08/2017 Document Reviewed: 05/20/2015  Elsevier Interactive Patient Education  2019 [employer]

## 2018-07-24 NOTE — Progress Notes (Signed)
Name: Curtis Underwood   MRN: [id number]    DOB: [birth year]   Date:07/24/2018       Progress Note  Subjective  Chief Complaint  Chief Complaint  Patient presents with  . Annual Exam    HPI  Patient presents for annual CPE and fasting labs.  USPSTF grade A and B recommendations:  Diet: Has biscuit and crackers while at work overnight, eats dinner around 5pm before work. Not eating out much, drinking water, avoiding sweet beverages. Exercise: Working 3rd shift, so it's hard to get out to walk.  Working a lot of hours right now., so his down time is rest.  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  Depression:  Depression screen [facility] 2/9 07/24/2018 06/23/2018 04/14/2018 08/09/2017 12/16/2016  Decreased Interest 0 0 0 0 0  Down, Depressed, Hopeless 0 0 0 0 0  PHQ - 2 Score 0 0 0 0 0  Altered sleeping 0 0 0 - -  Tired, decreased energy 0 0 0 - -  Change in appetite 0 0 0 - -  Feeling bad or failure about yourself  0 0 0 - -  Trouble concentrating 0 0 0 - -  Moving slowly or fidgety/restless 0 0 0 - -  Suicidal thoughts 0 0 0 - -  PHQ-9 Score 0 0 0 - -  Difficult doing work/chores Not difficult at all Not difficult at all Not difficult at all - -   Hypertension:  BP Readings from Last 3 Encounters:  07/24/18 122/78  06/23/18 130/82  04/14/18 122/80   Obesity: Wt Readings from Last 3 Encounters:  07/24/18 221 lb 3.2 oz (100.3 kg)  06/23/18 224 lb (101.6 kg)  04/14/18 224 lb 3.2 oz (101.7 kg)   BMI Readings from Last 3 Encounters:  07/24/18 28.40 kg/m  06/23/18 28.00 kg/m  04/14/18 28.02 kg/m    Lipids:  Lab Results  Component Value Date   CHOL 185 11/04/2017   CHOL 249 (H) 11/16/2016   Lab Results  Component Value Date   HDL 45 11/04/2017   HDL 47 11/16/2016   Lab Results  Component Value Date   LDLCALC 118 (H) 11/04/2017   LDLCALC 171 (H) 11/16/2016   Lab Results  Component Value Date   TRIG 113 11/04/2017   TRIG 156 (H) 11/16/2016   Lab Results  Component  Value Date   CHOLHDL 4.1 11/04/2017   CHOLHDL 5.3 (H) 11/16/2016   No results found for: LDLDIRECT Glucose:  Glucose, Bld  Date Value Ref Range Status  12/02/2017 101 (H) 65 - 99 mg/dL Final  11/16/2016 87 65 - 99 mg/dL Final     [facility] Visit from 07/24/2018 in [facility]  AUDIT-C Score  1     Married STD testing and prevention (HIV/chl/gon/syphilis): HIV will check today;  Hep C: We will check today  Skin cancer: He would like referral to dermatology for skin survey - has history of mole removal, but not skin cancer.  Colorectal cancer: Cologuard negative 2018. Denies family or personal history of colorectal cancer, no changes in BM's - no blood in stool, dark and tarry stool, mucus in stool, or constipation/diarrhea.  Prostate cancer: Will check today Lab Results  Component Value Date   PSA 2.3 11/16/2016   IPSS Questionnaire (AUA-7): Over the past month.   1)  How often have you had a sensation of not emptying your bladder completely after you finish urinating?  0 - Not at all  2)  How often have you had to urinate again less than two hours after you finished urinating? 1 - Less than 1 time in 5  3)  How often have you found you stopped and started again several times when you urinated?  0 - Not at all  4) How difficult have you found it to postpone urination?  1 - Less than 1 time in 5  5) How often have you had a weak urinary stream?  0 - Not at all  6) How often have you had to push or strain to begin urination?  0 - Not at all  7) How many times did you most typically get up to urinate from the time you went to bed until the time you got up in the morning?  1 - 1 time  Total score:  0-7 mildly symptomatic   8-19 moderately symptomatic   20-35 severely symptomatic  Score of 3 - will check PSA today.  Lung cancer:  Smoking 1 cigar a week; Low Dose CT Chest recommended if Age 61-80 years, 30 pack-year currently smoking OR have quit w/in 15years.  Patient does not qualify.   AAA: Will qualify at age 70. The USPSTF recommends one-time screening with ultrasonography in men ages 61 to 44 years who have ever smoked ECG:  On file - no chest pain, shortness of breath, or palpitations.  Advanced Care Planning: A voluntary discussion about advance care planning including the explanation and discussion of advance directives.  Discussed health care proxy and Living will, and the patient was able to identify a health care proxy as Wife - Ahan Eisenberger.  Patient does not have a living will at present time. If patient does have living will, I have requested they bring this to the clinic to be scanned in to their chart.  Patient Active Problem List   Diagnosis Date Noted  . History of transient ischemic attack (TIA) 04/14/2018  . Acid reflux 05/26/2015  . Allergic rhinitis 05/26/2015  . Dyslipidemia 05/26/2015  . Smokes cigars 05/26/2015  . Obstructive sleep apnea 05/26/2015  . PND (post-nasal drip) 05/26/2015  . Neck stiffness 05/26/2015    Past Surgical History:  Procedure Laterality Date  . APPENDECTOMY      Family History  Adopted: Yes  Family history unknown: Yes    Social History   Socioeconomic History  . Marital status: Married    Spouse name: MaryBeth  . Number of children: 2  . Years of education: 34  . Highest education level: Some college, no degree  Occupational History  . Not on file  Social Needs  . Financial resource strain: Not hard at all  . Food insecurity:    Worry: Never true    Inability: Never true  . Transportation needs:    Medical: No    Non-medical: No  Tobacco Use  . Smoking status: Current Some Day Smoker    Types: Cigars  . Smokeless tobacco: Never Used  Substance and Sexual Activity  . Alcohol use: Yes    Alcohol/week: 0.0 standard drinks    Comment: occasionally  . Drug use: No  . Sexual activity: Yes    Partners: Female  Lifestyle  . Physical activity:    Days per week: 5 days     Minutes per session: 30 min  . Stress: Not at all  Relationships  . Social connections:    Talks on phone: More than three times a week    Gets together: Once  a week    Attends religious service: Never    Active member of club or organization: No    Attends meetings of clubs or organizations: Never    Relationship status: Married  . Intimate partner violence:    Fear of current or ex partner: No    Emotionally abused: No    Physically abused: No    Forced sexual activity: No  Other Topics Concern  . Not on file  Social History Narrative  . Not on file     Current Outpatient Medications:  .  atorvastatin (LIPITOR) 40 MG tablet, Take 1 tablet (40 mg total) by mouth daily at 6 PM., Disp: 90 tablet, Rfl: 1 .  Cetirizine HCl (ZYRTEC ALLERGY) 10 MG CAPS, Take 1 capsule (10 mg total) by mouth daily., Disp: 90 capsule, Rfl: 3 .  fluticasone (FLONASE) 50 MCG/ACT nasal spray, Place 2 sprays into both nostrils daily., Disp: 16 g, Rfl: 2 .  omeprazole (PRILOSEC) 20 MG capsule, Take 1 capsule (20 mg total) by mouth daily., Disp: 90 capsule, Rfl: 0 .  tiZANidine (ZANAFLEX) 4 MG capsule, Take 1 capsule (4 mg total) by mouth 2 (two) times daily., Disp: 30 capsule, Rfl: 0 .  famotidine (PEPCID) 20 MG tablet, Take by mouth as needed. , Disp: , Rfl:   No Known Allergies   ROS  Constitutional: Negative for fever or weight change.  Respiratory: Negative for cough and shortness of breath.   Cardiovascular: Negative for chest pain or palpitations.  Gastrointestinal: Negative for abdominal pain, no bowel changes.  Musculoskeletal: Negative for gait problem or joint swelling.  Skin: Negative for rash.  Neurological: Negative for dizziness or headache.  No other specific complaints in a complete review of systems (except as listed in HPI above).   Objective  Vitals:   07/24/18 0739  BP: 122/78  Pulse: 72  Temp: 98.1 F (36.7 C)  SpO2: 99%  Weight: 221 lb 3.2 oz (100.3 kg)  Height: 6\' 2"   (1.88 m)    Body mass index is 28.4 kg/m.  Physical Exam Constitutional: Patient appears well-developed and well-nourished. No distress.  HENT: Head: Normocephalic and atraumatic. Ears: B TMs ok, no erythema or effusion; Nose: Nose normal. Mouth/Throat: Oropharynx is clear and moist. No oropharyngeal exudate.  Eyes: Conjunctivae and EOM are normal. Pupils are equal, round, and reactive to light. No scleral icterus.  Neck: Normal range of motion. Neck supple. No JVD present. No thyromegaly present.  Cardiovascular: Normal rate, regular rhythm and normal heart sounds.  No murmur heard. No BLE edema. Pulmonary/Chest: Effort normal and breath sounds normal. No respiratory distress. Abdominal: Soft. Bowel sounds are normal, no distension. There is no tenderness. no masses MALE GENITALIA: Deferred RECTAL: Deferred Musculoskeletal: Normal range of motion, no joint effusions. No gross deformities Neurological: he is alert and oriented to person, place, and time. No cranial nerve deficit. Coordination, balance, strength, speech and gait are normal.  Skin: Skin is warm and dry. No rash noted. No erythema.  Psychiatric: Patient has a normal mood and affect. behavior is normal. Judgment and thought content normal.  No results found for this or any previous visit (from the past 2160 hour(s)).  PHQ2/9: Depression screen [facility] 2/9 07/24/2018 06/23/2018 04/14/2018 08/09/2017 12/16/2016  Decreased Interest 0 0 0 0 0  Down, Depressed, Hopeless 0 0 0 0 0  PHQ - 2 Score 0 0 0 0 0  Altered sleeping 0 0 0 - -  Tired, decreased energy 0 0 0 - -  Change in appetite 0 0 0 - -  Feeling bad or failure about yourself  0 0 0 - -  Trouble concentrating 0 0 0 - -  Moving slowly or fidgety/restless 0 0 0 - -  Suicidal thoughts 0 0 0 - -  PHQ-9 Score 0 0 0 - -  Difficult doing work/chores Not difficult at all Not difficult at all Not difficult at all - -   Fall Risk: Fall Risk  07/24/2018 06/23/2018 04/14/2018  08/09/2017 12/16/2016  Falls in the past year? 0 0 No No No  Number falls in past yr: - 0 - - -  Injury with Fall? - 0 - - -   Functional Status Survey: Is the patient deaf or have difficulty hearing?: No Does the patient have difficulty seeing, even when wearing glasses/contacts?: No Does the patient have difficulty concentrating, remembering, or making decisions?: No Does the patient have difficulty walking or climbing stairs?: No Does the patient have difficulty dressing or bathing?: No Does the patient have difficulty doing errands alone such as visiting a doctor's office or shopping?: No  Assessment & Plan  1. Annual physical exam -Prostate cancer screening and PSA options (with potential risks and benefits of testing vs not testing) were discussed along with recent recs/guidelines. -USPSTF grade A and B recommendations reviewed with patient; age-appropriate recommendations, preventive care, screening tests, etc discussed and encouraged; healthy living encouraged; see AVS for patient education given to patient -Discussed importance of 150 minutes of physical activity weekly, eat two servings of fish weekly, eat one serving of tree nuts ( cashews, pistachios, pecans, almonds.Marland Kitchen) every other day, eat 6 servings of fruit/vegetables daily and drink plenty of water and avoid sweet beverages. - COMPLETE METABOLIC PANEL WITH GFR - Lipid panel - VITAMIN D 25 Hydroxy (Vit-D Deficiency, Fractures) - Ambulatory referral to Dermatology - Hepatitis C antibody - Ambulatory referral to Pulmonology - PSA - Tdap vaccine greater than or equal to 7yo IM  2. Obstructive sleep apnea - Ambulatory referral to Pulmonology  3. Smokes cigars  4. Dyslipidemia - Lipid panel  5. Overweight (BMI 25.0-29.9) - COMPLETE METABOLIC PANEL WITH GFR - Lipid panel  6. Diabetes mellitus screening - COMPLETE METABOLIC PANEL WITH GFR  7. Need for Tdap vaccination - Tdap vaccine greater than or equal to 7yo  IM  8. Need for hepatitis C screening test - Hepatitis C antibody  9. Encounter for screening for HIV - HIV Antibody (routine testing w rflx)  10. Prostate cancer screening - PSA  11. Vitamin D deficiency - VITAMIN D 25 Hydroxy (Vit-D Deficiency, Fractures)  12. Neck stiffness - tiZANidine (ZANAFLEX) 4 MG capsule; Take 1 capsule (4 mg total) by mouth 2 (two) times daily.  Dispense: 30 capsule; Refill: 1

## 2018-07-28 LAB — ADD ON CMP
AG RATIO: 2 (calc) (ref 1.0–2.5)
ALT: 28 U/L (ref 9–46)
AST: 19 U/L (ref 10–35)
Albumin: 4.2 g/dL (ref 3.6–5.1)
Alkaline phosphatase (APISO): 89 U/L (ref 40–115)
BUN: 12 mg/dL (ref 7–25)
CO2: 26 mmol/L (ref 20–32)
Calcium: 9.1 mg/dL (ref 8.6–10.3)
Chloride: 106 mmol/L (ref 98–110)
Creat: 1.17 mg/dL (ref 0.70–1.25)
GFR, EST NON AFRICAN AMERICAN: 67 mL/min/{1.73_m2} (ref >=60)
GFR, Est African American: 78 mL/min/{1.73_m2} (ref >=60)
Globulin: 2.1 g/dL (calc) (ref 1.9–3.7)
Glucose, Bld: 92 mg/dL (ref 65–99)
POTASSIUM: 4.2 mmol/L (ref 3.5–5.3)
Sodium: 142 mmol/L (ref 135–146)
Total Bilirubin: 0.5 mg/dL (ref 0.2–1.2)
Total Protein: 6.3 g/dL (ref 6.1–8.1)

## 2018-07-28 LAB — TEST AUTHORIZATION

## 2018-07-28 LAB — LIPID PANEL
Cholesterol: 159 mg/dL (ref <200)
HDL: 43 mg/dL (ref >40)
LDL Cholesterol (Calc): 101 mg/dL (calc) — ABNORMAL HIGH
NON-HDL CHOLESTEROL (CALC): 116 mg/dL (ref <130)
Total CHOL/HDL Ratio: 3.7 (calc) (ref <5.0)
Triglycerides: 66 mg/dL (ref <150)

## 2018-07-28 LAB — PSA: PSA: 1.3 ng/mL (ref <=4.0)

## 2018-07-28 LAB — HEPATITIS C ANTIBODY
Hepatitis C Ab: NONREACTIVE
SIGNAL TO CUT-OFF: 0.02 (ref <1.00)

## 2018-07-28 LAB — HIV ANTIBODY (ROUTINE TESTING W REFLEX): HIV: NONREACTIVE

## 2018-07-28 LAB — VITAMIN D 25 HYDROXY (VIT D DEFICIENCY, FRACTURES): Vit D, 25-Hydroxy: 19 ng/mL — ABNORMAL LOW (ref 30–100)

## 2018-08-15 ENCOUNTER — Ambulatory Visit: Payer: Self-pay | Admitting: *Deleted

## 2018-08-15 NOTE — Telephone Encounter (Signed)
Returned call to patient who states he was see a few weeks ago for physical and  stiff neck and shoulder issues and is scheduled for rehabilitation work.  He states that since being seen he has hurt his back  He rates the pain at 5-6 waste level lower back.  He is unsure of any injury.  He states he has moved Christmas in and out. He states that when he first gets up from a prolonged sitting he has to move slowly. He states after being up for a while he moves fine.  The stiffness is gone. He denies that the pain radiates to his legs. He say some times it hurts more on the left of his spine. He has no bowel or bladder issues. He is taking Ibuprofen and muscle relaxer's prescribed for his neck and shoulder. Pt refused office visit. He is requesting home care advice until seen by Rehab. Care advice read to patient. Pt verbalized understanding of all instructions. Pt encouraged to call back if symptoms worsen. Pt agrees to plan. Reason for Disposition . Back pain  Answer Assessment - Initial Assessment Questions 1. ONSET: "When did the pain begin?"      1week ago 2. LOCATION: "Where does it hurt?" (upper, mid or lower back)     Lower back around waste 3. SEVERITY: "How bad is the pain?"  (e.g., Scale 1-10; mild, moderate, or severe)   - MILD (1-3): doesn't interfere with normal activities    - MODERATE (4-7): interferes with normal activities or awakens from sleep    - SEVERE (8-10): excruciating pain, unable to do any normal activities      5-6 interferes with movement 4. PATTERN: "Is the pain constant?" (e.g., yes, no; constant, intermittent)      Tender but stiffness goes away 5. RADIATION: "Does the pain shoot into your legs or elsewhere?"     no 6. CAUSE:  "What do you think is causing the back pain?"       Sometimes to left of spine 7. BACK OVERUSE:  "Any recent lifting of heavy objects, strenuous work or exercise?"     Christmas stuff 8. MEDICATIONS: "What have you taken so far for the  pain?" (e.g., nothing, acetaminophen, NSAIDS)     Ibuprofen muscle relaxer 9. NEUROLOGIC SYMPTOMS: "Do you have any weakness, numbness, or problems with bowel/bladder control?"     no 10. OTHER SYMPTOMS: "Do you have any other symptoms?" (e.g., fever, abdominal pain, burning with urination, blood in urine)       no 11. PREGNANCY: "Is there any chance you are pregnant?" (e.g., yes, no; LMP)       N/A  Protocols used: BACK PAIN-A-AH

## 2018-08-15 NOTE — Telephone Encounter (Signed)
Summary: Call back    Patient has a PT appt but not until 1/27, he is having lower back pain please advise on what he can do to help with the pain. He works 3rd shift, PLEASE DO NOT CALL until 3:30, he is sleeping.   Best call back is [phone]

## 2018-08-17 NOTE — Telephone Encounter (Addendum)
Called to early, patient want a call after 3;30.  LVM on today informing patient to call office back in regards to his concerns.

## 2018-08-28 ENCOUNTER — Ambulatory Visit: Payer: Managed Care, Other (non HMO) | Attending: Family Medicine | Admitting: Physical Therapy

## 2018-08-28 ENCOUNTER — Encounter: Payer: Self-pay | Admitting: Physical Therapy

## 2018-08-28 ENCOUNTER — Other Ambulatory Visit: Payer: Self-pay

## 2018-08-28 DIAGNOSIS — G8929 Other chronic pain: Secondary | ICD-10-CM | POA: Diagnosis present

## 2018-08-28 DIAGNOSIS — M546 Pain in thoracic spine: Secondary | ICD-10-CM | POA: Diagnosis present

## 2018-08-28 DIAGNOSIS — R293 Abnormal posture: Secondary | ICD-10-CM | POA: Diagnosis present

## 2018-08-28 DIAGNOSIS — M6281 Muscle weakness (generalized): Secondary | ICD-10-CM

## 2018-08-28 DIAGNOSIS — M62838 Other muscle spasm: Secondary | ICD-10-CM | POA: Diagnosis present

## 2018-08-28 DIAGNOSIS — M542 Cervicalgia: Secondary | ICD-10-CM | POA: Insufficient documentation

## 2018-08-28 NOTE — Therapy (Signed)
[facility] [address] [place], [place], [address] Phone: [phone]   Fax:  [phone]  Physical Therapy Evaluation  Patient Details  Name: Curtis Underwood MRN: [id number] Date of Birth: Feb 22, 1956 Referring Provider (PT): [name] FNP   Encounter Date: 08/28/2018  PT End of Session - 08/29/18 1944    Visit Number  1    Number of Visits  9    Date for PT Re-Evaluation  10/24/18    Authorization Type  1/10 (eval 08/28/2018)    PT Start Time  1512    PT Stop Time  1605    PT Time Calculation (min)  53 min    Activity Tolerance  Patient limited by pain;Patient tolerated treatment well    Behavior During Therapy  [facility] for tasks assessed/performed       Past Medical History:  Diagnosis Date  . Hyperlipidemia     Past Surgical History:  Procedure Laterality Date  . APPENDECTOMY      There were no vitals filed for this visit.   Subjective Assessment - 08/29/18 2053    Limitations  Reading;House hold activities;Lifting;Writing    Currently in Pain?  Yes    Pain Location  Neck    Pain Descriptors / Indicators  Burning;Pins and needles;Numbness;Tightness;Aching    Pain Type  Chronic pain    Pain Radiating Towards  upper arm and hands; headaches    Pain Onset  More than a month ago    Pain Frequency  Constant       SUBJECTIVE Chief complaint:  Patient states pain originates between shoulder blades and then travels up the neck to the base of skull. Neck area feels like a rock and the tension goes up in the head causing bad headaches. Headaches respond to pain relievers and laying down. Patient does not report any changes to bowel/bladder, vision, dizziness, swallowing, appetite, nor light sensitivity. Neck tension is worse when sitting in front of the computer at his job. Sleeping at night on the stomach with his arm out, he wakes up with numbness down the arm. Additionally, in sleeping with arm overhead, he has pins and  needles. Onset: 1990 or earlier MD: [name], NP Pain: 3/10 Present, 1/10 Best, 9/10 Worst: Aggravating factors: Easing factors: Recent neck trauma: No Prior history of neck injury or pain: Yes Radiating pain: Yes  Numbness/Tingling: Yes Follow-up appointment with MD: Yes Dominant hand: right Imaging: No   OBJECTIVE  Mental Status Patient is oriented to person, place and time.  Recent memory is intact.  Remote memory is intact.  Attention span and concentration are intact.  Expressive speech is intact.  Patient's fund of knowledge is within normal limits for educational level.  SENSATION: Grossly intact to light touch bilateral UE as determined by testing dermatomes C2-T2 Proprioception and hot/cold testing deferred on this date   MUSCULOSKELETAL: Tremor: None Bulk: Normal Tone: Normal  Posture Patient sits with a slumped posture, forward head, rounded shoulders.   Gait Gait assessment deferred on this date   Palpation  Palpation to B medial scapular borders and thoracic paraspinals produced tenderness and concordant pain, L>R along the medial inferior aspect of the scapula. Palpation of the subocciptals produces concordant pain and increased tension is present B.    Strength R/L 5/5 Shoulder flexion (anterior deltoid/pec major/coracobrachialis, axillary n. (C5/6) and musculocutaneous n. (C5-7)) 5/5 Shoulder abduction (deltoid/supraspinatus, axillary/suprascapular n, C5) 3/3 Lower trapezius  4-/3+ Middle trapezius 5/5 Upper trapezius 4/4 Shoulder  extension (posterior deltoid, lats, teres major, axillary/thoracodorsal n.) 5/5 Elbow flexion (biceps brachii, brachialis, brachioradialis, musculoskeletal n, C5/6) 5/5 Elbow extension (triceps, radial n, C7) 5/5 Wrist Extension (C6/7) 5/5 Wrist Flexion (C6/7) 5/5 Finger adduction (interossei, ulnar n, T1) Cervical isometrics are strong in all directions.   AROM R/L ~45 Cervical Flexion* ~60 Cervical  Extension* WFL Cervical Lateral Flexion* 48/50 Cervical Rotation* WFL for all planes of shoulder motion and non painful. *Indicates pain  PROM=AROM  Repeated Movements No centralization or peripheralization of symptoms with repeated cervical protraction and retraction. Repeated cervical retraction produced concordant pain at the suboccipital region.    Passive Accessory Intervertebral Motion (PAIVM) Pt denies reproduction of neck pain with CPA C2-T7 and UPA bilaterally C1-T7. Generally hypomobile throughout. Repeated mobilizations reduces pain at levels C5-T1.  Passive Physiological Intervertebral Motion (PPIVM) Normal flexion and extension with PPIVM testing  SPECIAL TESTS Distraction Test: Positive  Hoffman Sign (cervical cord compression): R: Negative L: Negative ULTT Median: R: Positive L: Positive ULTT Ulnar: R: Positive L: Negative ULTT Radial: R: Positive L: Positive  Cervical Radiculopathy 1. ULTTa: Any one of the following: A) symptom reproduction; B) side-to-side difference >10 degrees in elbow extension; or C) with regard to involved/painful side: ipsilateral neck lateral flexion decreases symptoms and/or contralateral neck lateral flexion increases symptoms.  2. Distraction test: symptom reduction.  3. Spurling's A: symptom reproduction.    ASSESSMENT Clinical Impression: Patient is a pleasant 63 year-old male referred for neck and thoracic pain with radiating symptoms in BUE and headaches. PT examination reveals deficits in shoulder extension and lower trapezius strength (3/5 BUE) and middle trapezius (4-/5 RUE, 3+/5 LUE), cervical rotation ROM (R 48 degrees, L 50 degrees), positive ULTT (median, ulnar, and radial nerves on RUE, median and radial nerves on LUE), positive distraction test . Patient presents with deficits in strength, mobility, range of motion, and pain (worst 9/10). Patient's progress may be limited 2/2 to the chronicity of the complaints, repetitive  positioning he sustains at work, and his current work schedule impacting his sleep and availability for therapy; however, patient is motivated and reports high confidence in his ability to commit to a HEP. Patient will benefit from skilled therapeutic intervention to address the aforementioned deficits and return to pain-free function at home and work.    Objective measurements completed on examination: See above findings.   TREATMENT  Manual Therapy: Supine Suboccipital release, 1 min: 30 sec (on: off) x 2 bouts Manual cervical distraction, 2 min: 1 min (on: off) x 3 bouts Passive Median nerve glides, x10 BUE  Neuromuscular Re-education: Cervical Retractions 5 sec x5; 3 sec x5    Patient Response to interventions: Patient reported decreased symptoms with manual interventions, but was unable to tolerate prolonged 5 sec hold for cervical retractions as symptoms increased for reps 4 and 5. Patient demonstrated tolerance for 3 sec hold with no increase in symptoms and the reported sensation of a "good stretch" in the suboccipital region.    PT Education - 08/29/18 2045    Education Details  POC, prognosis, HEP    Person(s) Educated  Patient    Methods  Explanation    Comprehension  Verbalized understanding;Need further instruction       PT Short Term Goals - 08/29/18 2043      PT SHORT TERM GOAL #1   Title  Patient will be independent with HEP to improve function and gains made from therapeutic intervention.    Baseline  IE: provided    Time  4    Period  Weeks    Status  New    Target Date  09/26/18        PT Long Term Goals - 08/29/18 1949      PT LONG TERM GOAL #1   Title  Patient will decrease worst neck pain as reported on NPRS by at least 2 points in order to demonstrate clinically significant reduction in pain for improved overall QOL.     Baseline  IE: worst 9/10    Time  8    Period  Weeks    Status  New    Target Date  10/24/18      PT LONG TERM GOAL #2    Title  Patient will demonsrate improved pain-free cervical rotation ROM to greater than/ equal to 60 degrees bilaterally for improved function and mobility.    Baseline  IE: R 48 degrees, L 50 degrees    Time  8    Period  Weeks    Status  New    Target Date  10/24/18      PT LONG TERM GOAL #3   Title  Pt will increase strength of lower trapezius and middle trapezius by at least 1/2 MMT grade in order to demonstrate improvement in strength and function.    Baseline  IE: LT 3/5 B, MT R 4-/5, L 3+/5    Time  8    Period  Weeks    Status  New    Target Date  10/24/18      PT LONG TERM GOAL #4   Title  Patient will report decreased numbness and tingling in BUE when sleeping to less than 25% of the time for improved sleep quality and overall QOL.    Baseline  IE: greater than 75% of the time    Time  8    Period  Weeks    Status  New    Target Date  10/24/18      PT LONG TERM GOAL #5   Title  Pt will demonstrate decrease in NDI by at least 19% in order to demonstrate clinically significant reduction in disability related to neck injury/pain.    Baseline  IE: not collected    Time  8    Period  Weeks    Status  New    Target Date  10/24/18             Plan - 08/29/18 2046    Clinical Impression Statement Patient is a pleasant 63 year-old male referred for neck and thoracic pain with radiating symptoms in BUE and headaches. PT examination reveals deficits in shoulder extension and lower trapezius strength (3/5 BUE) and middle trapezius (4-/5 RUE, 3+/5 LUE), cervical rotation ROM (R 48 degrees, L 50 degrees), positive ULTT (median, ulnar, and radial nerves on RUE, median and radial nerves on LUE), positive distraction test . Patient presents with deficits in strength, mobility, range of motion, and pain (worst 9/10). Patient's progress may be limited 2/2 to the chronicity of the complaints, repetitive positioning he sustains at work, and his current work schedule impacting his sleep  and availability for therapy; however, patient is motivated and reports high confidence in his ability to commit to a HEP. Patient will benefit from skilled therapeutic intervention to address the aforementioned deficits and return to pain-free function at home and work.      History and Personal Factors relevant to plan of care:  (+) motivated, social support, rates health  as good overall; (-) chronicity, radiating symptoms, repetitive positioning at work, high pain intensity    Clinical Presentation  Stable    Clinical Presentation due to:  Low (stable): no personal factors/comorbidities, 1-2 body systems/activity limitations/participation restrictions     Clinical Decision Making  Low    Rehab Potential  Good    PT Frequency  1x / week    PT Duration  8 weeks    PT Treatment/Interventions  ADLs/Self Care Home Management;Cryotherapy;Electrical Stimulation;Moist Heat;Traction;Ultrasound;Gait training;Stair training;Functional mobility training;Patient/family education;Therapeutic activities;Therapeutic exercise;Balance training;Neuromuscular re-education;Manual techniques;Dry needling;Taping;Spinal Manipulations;Joint Manipulations;Iontophoresis 4mg /ml Dexamethasone    PT Next Visit Plan  NDI outcome measure; manual interventions, gentle strengthening as tolerated    Consulted and Agree with Plan of Care  Patient       Patient will benefit from skilled therapeutic intervention in order to improve the following deficits and impairments:  Impaired sensation, Improper body mechanics, Pain, Postural dysfunction, Increased muscle spasms, Decreased endurance, Decreased strength, Hypomobility, Impaired flexibility, Decreased range of motion  Visit Diagnosis: Chronic neck pain  Pain in thoracic spine  Other muscle spasm  Abnormal posture  Muscle weakness (generalized)     Problem List Patient Active Problem List   Diagnosis Date Noted  . Overweight (BMI 25.0-29.9) 07/24/2018  . Diabetes  mellitus screening 07/24/2018  . History of transient ischemic attack (TIA) 04/14/2018  . Acid reflux 05/26/2015  . Allergic rhinitis 05/26/2015  . Dyslipidemia 05/26/2015  . Smokes cigars 05/26/2015  . Obstructive sleep apnea 05/26/2015  . PND (post-nasal drip) 05/26/2015  . Neck stiffness 05/26/2015   Myles Gip PT, DPT [phone] 08/30/2018, 8:34 AM  [facility] [address] [place], [place], [address] Phone: [phone]   Fax:  [phone]  Name: HARRY SHUCK MRN: [id number] Date of Birth: 1956/05/10

## 2018-08-30 NOTE — Patient Instructions (Signed)
Home Exercises  Access Code: [facility]  URL: [email or web address]  Date: 08/30/2018  Prepared by: Myles Gip   Exercises  Seated Cervical Retraction - 5 reps - 3 hold - 3x daily - 7x weekly  Hooklying Neck Distraction and Traction - 5 reps - 15 hold - 1x daily - 7x weekly   -------------------------- How to Make the Massage Peanut for Neck Tension Release  We discussed possibly creating a self-massage tool to help relieve the tension at the base of your skull by joining two tennis balls together. You can check out this video for a how to:  Https://youtu.be/WYzcHSyHoZs   Once you have the peanut, you can test your tolerance to the duration of the release (start short, 30-60 sec) and the intensity (performing on a soft mattress will be less intense and less irritating, a good place to start).

## 2018-09-08 ENCOUNTER — Ambulatory Visit: Payer: Managed Care, Other (non HMO) | Attending: Family Medicine | Admitting: Physical Therapy

## 2018-09-08 ENCOUNTER — Encounter: Payer: Self-pay | Admitting: Physical Therapy

## 2018-09-08 DIAGNOSIS — R293 Abnormal posture: Secondary | ICD-10-CM

## 2018-09-08 DIAGNOSIS — M542 Cervicalgia: Secondary | ICD-10-CM | POA: Insufficient documentation

## 2018-09-08 DIAGNOSIS — G8929 Other chronic pain: Secondary | ICD-10-CM

## 2018-09-08 DIAGNOSIS — M546 Pain in thoracic spine: Secondary | ICD-10-CM

## 2018-09-08 DIAGNOSIS — M6281 Muscle weakness (generalized): Secondary | ICD-10-CM | POA: Diagnosis present

## 2018-09-08 DIAGNOSIS — M62838 Other muscle spasm: Secondary | ICD-10-CM

## 2018-09-08 NOTE — Patient Instructions (Signed)
Access Code: [facility]  URL: [email or web address]  Date: 09/08/2018  Prepared by: Dorcas Carrow   Exercises  Seated Upper Trapezius Stretch - 3 reps - 2 sets - 30 hold - 1x daily - 7x weekly  Seated Levator Scapulae Stretch - 3 reps - 2 sets - 30 hold - 1x daily - 7x weekly  Standing Cervical Flexion AROM - 15 reps - 2 sets - 1x daily - 7x weekly  Standing Cervical Extension AROM - 15 reps - 2 sets - 1x daily - 7x weekly  Seated Cervical Sidebending AROM - 15 reps - 2 sets - 1x daily - 7x weekly  Standing Cervical Rotation AROM - 15 reps - 2 sets - 1x daily - 7x weekly  Seated Scapular Retraction - 15 reps - 2 sets - 1x daily - 7x weekly

## 2018-09-08 NOTE — Therapy (Signed)
[facility] [address] [place], [place], [address] Phone: [phone]   Fax:  [phone]  Physical Therapy Treatment  Patient Details  Name: DAT DERKSEN MRN: [id number] Date of Birth: 1956/04/17 Referring Provider (PT): [name] FNP   Encounter Date: 09/08/2018  PT End of Session - 09/08/18 1026    Visit Number  2    Number of Visits  9    Date for PT Re-Evaluation  10/24/18    Authorization Type  1/10 (eval 08/28/2018)    PT Start Time  0728    PT Stop Time  0817    PT Time Calculation (min)  49 min    Activity Tolerance  Patient limited by pain;Patient tolerated treatment well    Behavior During Therapy  [facility] for tasks assessed/performed       Past Medical History:  Diagnosis Date  . Hyperlipidemia     Past Surgical History:  Procedure Laterality Date  . APPENDECTOMY      There were no vitals filed for this visit.  Subjective Assessment - 09/08/18 1025    Subjective  Pt. reports feeling better since eval on 1/27. Pt. still feels tightness in neck but has not noticed numbness/ tingling since eval.    Limitations  Reading;House hold activities;Lifting;Writing    Currently in Pain?  Yes    Pain Score  3     Pain Location  Neck    Pain Orientation  Mid    Pain Onset  More than a month ago        Treatment:  Manual:  Supine cervical PROM in all directions Supine cervical distraction 3 x 30 sec (relief noted) Supine suboccipital release 2 x 60 sec Supine B upper trap and levator stretches 2 x 30 sec Prone CPA/ UPA grade II-III mobilizations C3-T7 1 x 20 sec STM to suboccipitals and upper trap 5 minutes  Therex:  Seated cervical AROM all directions Seated upper trap and levator stretches Seated scap. Retractions 1x15 Reviewed HEP and tennis ball neck massage      PT Education - 09/08/18 1025    Education Details  see new HEP     Person(s) Educated  Patient    Methods  Explanation     Comprehension  Verbalized understanding;Returned demonstration       PT Short Term Goals - 08/29/18 2043      PT SHORT TERM GOAL #1   Title  Patient will be independent with HEP to improve function and gains made from therapeutic intervention.    Baseline  IE: provided    Time  4    Period  Weeks    Status  New    Target Date  09/26/18        PT Long Term Goals - 08/29/18 1949      PT LONG TERM GOAL #1   Title  Patient will decrease worst neck pain as reported on NPRS by at least 2 points in order to demonstrate clinically significant reduction in pain for improved overall QOL.     Baseline  IE: worst 9/10    Time  8    Period  Weeks    Status  New    Target Date  10/24/18      PT LONG TERM GOAL #2   Title  Patient will demonsrate improved pain-free cervical rotation ROM to greater than/ equal to 60 degrees bilaterally for improved function and mobility.    Baseline  IE: R 48 degrees, L 50 degrees    Time  8    Period  Weeks    Status  New    Target Date  10/24/18      PT LONG TERM GOAL #3   Title  Pt will increase strength of lower trapezius and middle trapezius by at least 1/2 MMT grade in order to demonstrate improvement in strength and function.    Baseline  IE: LT 3/5 B, MT R 4-/5, L 3+/5    Time  8    Period  Weeks    Status  New    Target Date  10/24/18      PT LONG TERM GOAL #4   Title  Patient will report decreased numbness and tingling in BUE when sleeping to less than 25% of the time for improved sleep quality and overall QOL.    Baseline  IE: greater than 75% of the time    Time  8    Period  Weeks    Status  New    Target Date  10/24/18      PT LONG TERM GOAL #5   Title  Pt will demonstrate decrease in NDI by at least 19% in order to demonstrate clinically significant reduction in disability related to neck injury/pain.    Baseline  IE: not collected    Time  8    Period  Weeks    Status  New    Target Date  10/24/18         Plan - 09/08/18  1026    Clinical Impression Statement  Pt. comes into clinic following night shift with stiff cervical and thoracic posture. Pt. stated relief with all supine stretching and PROM of neck, including distraction. PT noted hypomobility in cervical and thoracic spine during mobilizations and pt. was able to tolerate grade II-III without incr. sx/ numbness or tingling. Pt. needed min verbal cueing during seated scap retractions to maintain upright posture. PT educated pt. on new HEP and the importance of AROM of neck especially during his job when sitting at the computer for an extended perioud of time. Pt. reported soreness at the end of session but "felt better." Pt. will continue to benefit from skilled PT services in incr. cervical ROM/ strength to promote upright posture and decr. pain during ADLs and job requirements.    Clinical Presentation  Stable    Clinical Decision Making  Low    Rehab Potential  Good    PT Frequency  1x / week    PT Duration  8 weeks    PT Treatment/Interventions  ADLs/Self Care Home Management;Cryotherapy;Electrical Stimulation;Moist Heat;Traction;Ultrasound;Gait training;Stair training;Functional mobility training;Patient/family education;Therapeutic activities;Therapeutic exercise;Balance training;Neuromuscular re-education;Manual techniques;Dry needling;Taping;Spinal Manipulations;Joint Manipulations;Iontophoresis 4mg /ml Dexamethasone    PT Next Visit Plan  continue with manual therapy, review HEP, progress strengthening     PT Home Exercise Plan  see handouts    Consulted and Agree with Plan of Care  Patient       Patient will benefit from skilled therapeutic intervention in order to improve the following deficits and impairments:  Impaired sensation, Improper body mechanics, Pain, Postural dysfunction, Increased muscle spasms, Decreased endurance, Decreased strength, Hypomobility, Impaired flexibility, Decreased range of motion  Visit Diagnosis: Chronic neck  pain  Pain in thoracic spine  Other muscle spasm  Abnormal posture  Muscle weakness (generalized)     Problem List Patient Active Problem List   Diagnosis Date Noted  . Overweight (BMI 25.0-29.9) 07/24/2018  .  Diabetes mellitus screening 07/24/2018  . History of transient ischemic attack (TIA) 04/14/2018  . Acid reflux 05/26/2015  . Allergic rhinitis 05/26/2015  . Dyslipidemia 05/26/2015  . Smokes cigars 05/26/2015  . Obstructive sleep apnea 05/26/2015  . PND (post-nasal drip) 05/26/2015  . Neck stiffness 05/26/2015   Pura Spice, PT, DPT # [id number] [facility], [place] 09/08/2018, 10:33 AM  [facility] [address] [place], [place], [address] Phone: [phone]   Fax:  [phone]  Name: ESTHER BROYLES MRN: [id number] Date of Birth: 02/04/1956

## 2018-09-15 ENCOUNTER — Encounter: Payer: Self-pay | Admitting: Physical Therapy

## 2018-09-15 ENCOUNTER — Ambulatory Visit: Payer: Managed Care, Other (non HMO) | Admitting: Physical Therapy

## 2018-09-15 DIAGNOSIS — M542 Cervicalgia: Secondary | ICD-10-CM | POA: Diagnosis not present

## 2018-09-15 DIAGNOSIS — M62838 Other muscle spasm: Secondary | ICD-10-CM

## 2018-09-15 DIAGNOSIS — M6281 Muscle weakness (generalized): Secondary | ICD-10-CM

## 2018-09-15 DIAGNOSIS — G8929 Other chronic pain: Secondary | ICD-10-CM

## 2018-09-15 DIAGNOSIS — M546 Pain in thoracic spine: Secondary | ICD-10-CM

## 2018-09-15 DIAGNOSIS — R293 Abnormal posture: Secondary | ICD-10-CM

## 2018-09-15 NOTE — Therapy (Signed)
[facility] [address]. [place], [place], [address] Phone: [phone]   Fax:  [phone]  Physical Therapy Treatment  Patient Details  Name: Curtis Underwood MRN: [id number] Date of Birth: 1955-09-14 Referring Provider (PT): [name] FNP   Encounter Date: 09/15/2018  PT End of Session - 09/15/18 0824    Visit Number  3    Number of Visits  9    Date for PT Re-Evaluation  10/24/18    Authorization Type  3/10 (eval 08/28/2018)    PT Start Time  0729    PT Stop Time  0818    PT Time Calculation (min)  49 min    Activity Tolerance  Patient limited by pain;Patient tolerated treatment well    Behavior During Therapy  [facility] for tasks assessed/performed       Past Medical History:  Diagnosis Date  . Hyperlipidemia     Past Surgical History:  Procedure Laterality Date  . APPENDECTOMY      There were no vitals filed for this visit.  Subjective Assessment - 09/15/18 0821    Subjective  Pt. reports soreness in posterior neck today that has been the same all week. Pt. reports some N/T down posterior L arm while doing L levator stretch but no N/T otherwise. Pt. states that he is sleeping better and has not had any HAs.    Limitations  Reading;House hold activities;Lifting;Writing    Currently in Pain?  Yes    Pain Score  2     Pain Location  Neck    Pain Orientation  Mid    Pain Descriptors / Indicators  Aching;Sore    Pain Onset  More than a month ago        Treatment:  Manual:  Supine cervical PROM in all directions Supine cervical distraction 3 x 30 sec (relief noted) Supine suboccipital release 4 x 30 sec Supine B upper trap and levator stretches 2 x 30 sec Prone CPA/ UPA grade II-III mobilizations C2-T7 1 x 20 sec  Therex:  Doorway stretch 2x30 sec Scap. Retractions 1x15 RTB RTB B ER 1x15  Reviewed HEP    PT Education - 09/15/18 0814    Education Details  See HEP    Person(s) Educated  Patient    Methods  Explanation;Demonstration;Handout    Comprehension  Verbalized understanding;Returned demonstration       PT Short Term Goals - 08/29/18 2043      PT SHORT TERM GOAL #1   Title  Patient will be independent with HEP to improve function and gains made from therapeutic intervention.    Baseline  IE: provided    Time  4    Period  Weeks    Status  New    Target Date  09/26/18        PT Long Term Goals - 08/29/18 1949      PT LONG TERM GOAL #1   Title  Patient will decrease worst neck pain as reported on NPRS by at least 2 points in order to demonstrate clinically significant reduction in pain for improved overall QOL.     Baseline  IE: worst 9/10    Time  8    Period  Weeks    Status  New    Target Date  10/24/18      PT LONG TERM GOAL #2   Title  Patient will demonsrate improved pain-free cervical rotation ROM to greater than/ equal to 60 degrees  bilaterally for improved function and mobility.    Baseline  IE: R 48 degrees, L 50 degrees    Time  8    Period  Weeks    Status  New    Target Date  10/24/18      PT LONG TERM GOAL #3   Title  Pt will increase strength of lower trapezius and middle trapezius by at least 1/2 MMT grade in order to demonstrate improvement in strength and function.    Baseline  IE: LT 3/5 B, MT R 4-/5, L 3+/5    Time  8    Period  Weeks    Status  New    Target Date  10/24/18      PT LONG TERM GOAL #4   Title  Patient will report decreased numbness and tingling in BUE when sleeping to less than 25% of the time for improved sleep quality and overall QOL.    Baseline  IE: greater than 75% of the time    Time  8    Period  Weeks    Status  New    Target Date  10/24/18      PT LONG TERM GOAL #5   Title  Pt will demonstrate decrease in NDI by at least 19% in order to demonstrate clinically significant reduction in disability related to neck injury/pain.    Baseline  IE: not collected    Time  8    Period  Weeks    Status  New     Target Date  10/24/18         Plan - 09/15/18 9417    Clinical Impression Statement  Pt. presents to clinic with overall decr. neck pain but soreness from working night shift. Pt. reports relief with all manual treatment, especially suboccipital release and states that his HAs initiate from his occiput. Pt. tolerated grade III mobs to C2-T7 with no incr. pain or N/T. Pt. was given resisted scap retraction therex and doorway stretch to HEP and demonstrated good technique throughout. Pt. will continue to benefit from skilled PT services in order to improve neck ROM/ muscular tightness and incr. postural strength to promote pain-free upright posture and functional mobility.    Clinical Presentation  Stable    Clinical Decision Making  Low    Rehab Potential  Good    PT Frequency  1x / week    PT Duration  8 weeks    PT Treatment/Interventions  ADLs/Self Care Home Management;Cryotherapy;Electrical Stimulation;Moist Heat;Traction;Ultrasound;Gait training;Stair training;Functional mobility training;Patient/family education;Therapeutic activities;Therapeutic exercise;Balance training;Neuromuscular re-education;Manual techniques;Dry needling;Taping;Spinal Manipulations;Joint Manipulations;Iontophoresis 4mg /ml Dexamethasone    PT Next Visit Plan  continue with manual therapy, progress strengthening     PT Home Exercise Plan  see handouts    Consulted and Agree with Plan of Care  Patient       Patient will benefit from skilled therapeutic intervention in order to improve the following deficits and impairments:  Impaired sensation, Improper body mechanics, Pain, Postural dysfunction, Increased muscle spasms, Decreased endurance, Decreased strength, Hypomobility, Impaired flexibility, Decreased range of motion  Visit Diagnosis: Chronic neck pain  Pain in thoracic spine  Other muscle spasm  Abnormal posture  Muscle weakness (generalized)     Problem List Patient Active Problem List    Diagnosis Date Noted  . Overweight (BMI 25.0-29.9) 07/24/2018  . Diabetes mellitus screening 07/24/2018  . History of transient ischemic attack (TIA) 04/14/2018  . Acid reflux 05/26/2015  . Allergic rhinitis 05/26/2015  . Dyslipidemia  05/26/2015  . Smokes cigars 05/26/2015  . Obstructive sleep apnea 05/26/2015  . PND (post-nasal drip) 05/26/2015  . Neck stiffness 05/26/2015   Pura Spice, PT, DPT # [id number] CHJSC, [place] 09/15/2018, 12:39 PM  [facility] [address] [place], [place], [address] Phone: [phone]   Fax:  [phone]  Name: DEAARON FULGHUM MRN: [id number] Date of Birth: 1956-05-17

## 2018-09-15 NOTE — Patient Instructions (Signed)
Access Code: [email or web address]  URL: [email or web address]  Date: 09/15/2018  Prepared by: Dorcas Carrow   Exercises  Doorway Pec Stretch at 90 Degrees Abduction - 3 reps - 1 sets - 30 hold - 1x daily - 7x weekly  Shoulder External Rotation and Scapular Retraction with Resistance - 15 reps - 2 sets - 1x daily - 7x weekly  Scapular Retraction with Resistance - 15 reps - 2 sets - 1x daily - 7x weekly

## 2018-09-22 ENCOUNTER — Ambulatory Visit: Payer: Managed Care, Other (non HMO) | Admitting: Physical Therapy

## 2018-09-29 ENCOUNTER — Encounter: Payer: Managed Care, Other (non HMO) | Admitting: Physical Therapy

## 2018-09-29 ENCOUNTER — Other Ambulatory Visit: Payer: Self-pay | Admitting: Family Medicine

## 2018-09-29 DIAGNOSIS — K219 Gastro-esophageal reflux disease without esophagitis: Secondary | ICD-10-CM

## 2018-10-06 ENCOUNTER — Institutional Professional Consult (permissible substitution): Payer: BLUE CROSS/BLUE SHIELD | Admitting: Internal Medicine

## 2018-10-06 ENCOUNTER — Encounter: Payer: Managed Care, Other (non HMO) | Admitting: Physical Therapy

## 2018-10-13 ENCOUNTER — Encounter: Payer: Managed Care, Other (non HMO) | Admitting: Physical Therapy

## 2018-10-20 ENCOUNTER — Encounter: Payer: Managed Care, Other (non HMO) | Admitting: Physical Therapy

## 2018-10-24 ENCOUNTER — Telehealth: Payer: Self-pay

## 2018-10-24 NOTE — Telephone Encounter (Signed)
LM on VM for patient to call our office regarding apt for Friday and to screen for Covid.

## 2018-10-24 NOTE — Telephone Encounter (Signed)
Called patient for COVID-19 screening.  Have you recently traveled any where out of the local area in the last 2 weeks? No  Have you been in close contact with a person diagnosed with COVID-19 within the last 2 weeks? No  Do you currently have any fever, cough, or shortness of breath? No    Okay to proceed with visit.

## 2018-10-24 NOTE — Telephone Encounter (Signed)
Noted.  Will close encounter.  

## 2018-10-26 ENCOUNTER — Encounter: Payer: Self-pay | Admitting: Family Medicine

## 2018-10-26 ENCOUNTER — Other Ambulatory Visit: Payer: Self-pay

## 2018-10-26 ENCOUNTER — Ambulatory Visit (INDEPENDENT_AMBULATORY_CARE_PROVIDER_SITE_OTHER): Payer: Managed Care, Other (non HMO) | Admitting: Family Medicine

## 2018-10-26 VITALS — BP 130/80 | HR 62 | Temp 98.0°F | Resp 16 | Ht 74.0 in | Wt 230.5 lb

## 2018-10-26 DIAGNOSIS — E663 Overweight: Secondary | ICD-10-CM

## 2018-10-26 DIAGNOSIS — J302 Other seasonal allergic rhinitis: Secondary | ICD-10-CM

## 2018-10-26 DIAGNOSIS — G4733 Obstructive sleep apnea (adult) (pediatric): Secondary | ICD-10-CM

## 2018-10-26 DIAGNOSIS — E785 Hyperlipidemia, unspecified: Secondary | ICD-10-CM

## 2018-10-26 DIAGNOSIS — Z8673 Personal history of transient ischemic attack (TIA), and cerebral infarction without residual deficits: Secondary | ICD-10-CM

## 2018-10-26 DIAGNOSIS — K219 Gastro-esophageal reflux disease without esophagitis: Secondary | ICD-10-CM

## 2018-10-26 DIAGNOSIS — M436 Torticollis: Secondary | ICD-10-CM

## 2018-10-26 DIAGNOSIS — F17290 Nicotine dependence, other tobacco product, uncomplicated: Secondary | ICD-10-CM

## 2018-10-26 LAB — CBC WITH DIFFERENTIAL/PLATELET
Absolute Monocytes: 607 cells/uL (ref 200–950)
Basophils Absolute: 57 cells/uL (ref 0–200)
Basophils Relative: 0.7 %
Eosinophils Absolute: 131 cells/uL (ref 15–500)
Eosinophils Relative: 1.6 %
HCT: 44.8 % (ref 38.5–50.0)
Hemoglobin: 15.3 g/dL (ref 13.2–17.1)
Lymphs Abs: 2714 cells/uL (ref 850–3900)
MCH: 30.6 pg (ref 27.0–33.0)
MCHC: 34.2 g/dL (ref 32.0–36.0)
MCV: 89.6 fL (ref 80.0–100.0)
MPV: 12.4 fL (ref 7.5–12.5)
Monocytes Relative: 7.4 %
Neutro Abs: 4690 cells/uL (ref 1500–7800)
Neutrophils Relative %: 57.2 %
Platelets: 216 10*3/uL (ref 140–400)
RBC: 5 10*6/uL (ref 4.20–5.80)
RDW: 12.3 % (ref 11.0–15.0)
TOTAL LYMPHOCYTE: 33.1 %
WBC: 8.2 10*3/uL (ref 3.8–10.8)

## 2018-10-26 LAB — LIPID PANEL
Cholesterol: 187 mg/dL (ref <200)
HDL: 42 mg/dL (ref >=40)
LDL Cholesterol (Calc): 120 mg/dL (calc) — ABNORMAL HIGH
Non-HDL Cholesterol (Calc): 145 mg/dL (calc) — ABNORMAL HIGH (ref <130)
Total CHOL/HDL Ratio: 4.5 (calc) (ref <5.0)
Triglycerides: 137 mg/dL (ref <150)

## 2018-10-26 LAB — COMPLETE METABOLIC PANEL WITH GFR
AG Ratio: 2 (calc) (ref 1.0–2.5)
ALT: 41 U/L (ref 9–46)
AST: 25 U/L (ref 10–35)
Albumin: 4.5 g/dL (ref 3.6–5.1)
Alkaline phosphatase (APISO): 98 U/L (ref 35–144)
BUN: 12 mg/dL (ref 7–25)
CO2: 29 mmol/L (ref 20–32)
Calcium: 9.4 mg/dL (ref 8.6–10.3)
Chloride: 103 mmol/L (ref 98–110)
Creat: 1.13 mg/dL (ref 0.70–1.25)
GFR, Est African American: 81 mL/min/{1.73_m2} (ref >=60)
GFR, Est Non African American: 70 mL/min/{1.73_m2} (ref >=60)
Globulin: 2.3 g/dL (calc) (ref 1.9–3.7)
Glucose, Bld: 89 mg/dL (ref 65–99)
Potassium: 4.1 mmol/L (ref 3.5–5.3)
SODIUM: 139 mmol/L (ref 135–146)
Total Bilirubin: 0.4 mg/dL (ref 0.2–1.2)
Total Protein: 6.8 g/dL (ref 6.1–8.1)

## 2018-10-26 MED ORDER — ATORVASTATIN CALCIUM 40 MG PO TABS: 40.0000 mg | ORAL_TABLET | Freq: Every day | ORAL | 1 refills | Status: DC

## 2018-10-26 MED ORDER — ASPIRIN EC 81 MG PO TBEC: 81.0000 mg | DELAYED_RELEASE_TABLET | Freq: Every day | ORAL | 1 refills | Status: DC

## 2018-10-26 MED ORDER — LEVOCETIRIZINE DIHYDROCHLORIDE 5 MG PO TABS: 5.0000 mg | ORAL_TABLET | Freq: Every evening | ORAL | 1 refills | Status: DC

## 2018-10-26 MED ORDER — TIZANIDINE HCL 4 MG PO CAPS: 4.0000 mg | ORAL_CAPSULE | Freq: Two times a day (BID) | ORAL | 0 refills | Status: DC

## 2018-10-26 NOTE — Progress Notes (Signed)
Name: Curtis Underwood   MRN: [id number]    DOB: Oct 30, 1955   Date:10/26/2018       Progress Note  Subjective  Chief Complaint  Chief Complaint  Patient presents with  . Follow-up    3 month recheck    HPI  AR: Using Zyrtec and flonase in the spring due to [place].  Not working as well as it used to - we will switch him to Xyzal.  Neck Pain: Was taking tizanidine PRN, ran out.  He did attend PT for neck pain, but it was very expensive and only able to go to 3 sessions.  Was given exercises to do at home and he has been doing them during his night shift hours at work.  Overweight: He is walking 1.5 miles daily - will try get back to walking once the weather cools off (prefers to walk after work). Does not eat out frequently, eats fairly balanced diet - works night shift for [employer]. He makes a goal weight of #215. Body mass index is 29.59 kg/m.   GERD: Spicy food, red sauce, onions is a trigger; taking omeprazole or famotadine PRN.  Discussed risk of long-term PPI use. No dysphagia, denies chest pain, abdominal pain, nausea, vomiting, blood in stool, or dark and tarry stools. Does not eat late at night. Stable and unchanged at this time.  Bottom Lip lesion: He smokes cigars on the weekends, has never smoked cigarettes or used chewing tobacco.  He noticed a small white lesion on the center/right of his lower lip in September - the area has since healed and he has not concerns.  OSA: Seeing Dr. Ashby Dawes tomorrow, not using his machine because he needs a new mask and new evaluation. He still snores very loudly, sleeps during the day except on the weekends due to being on nightshift.   Smoking: Smokes cigars on the weekends; discussed cessation x2 minutes and he is not ready to quit at this time.  Unchanged.   Hx TIA: He was referred to neurologist but didn't go, he does not want to go now due to being too busy.  Has not had any more episodes.  He is taking lipitor, but stopped taking 81mg   ASA daily - advised to restart ASA 81mg .  He works a lot, but stress has increased lately due to COVID-19 pandemic (He works for [employer] in [place])  Hyperlipidemia: Taking lipitor 40mg  (has history of myalgias with other statins), no myalgias today, denies chest pains, shortness of breath. ECG is on file.  Patient Active Problem List   Diagnosis Date Noted  . Overweight (BMI 25.0-29.9) 07/24/2018  . Diabetes mellitus screening 07/24/2018  . History of transient ischemic attack (TIA) 04/14/2018  . Acid reflux 05/26/2015  . Allergic rhinitis 05/26/2015  . Dyslipidemia 05/26/2015  . Smokes cigars 05/26/2015  . Obstructive sleep apnea 05/26/2015  . PND (post-nasal drip) 05/26/2015  . Neck stiffness 05/26/2015    Past Surgical History:  Procedure Laterality Date  . APPENDECTOMY      Family History  Adopted: Yes  Family history unknown: Yes    Social History   Socioeconomic History  . Marital status: Married    Spouse name: MaryBeth  . Number of children: 2  . Years of education: 54  . Highest education level: Some college, no degree  Occupational History  . Not on file  Social Needs  . Financial resource strain: Not hard at all  . Food insecurity:    Worry: Never true  Inability: Never true  . Transportation needs:    Medical: No    Non-medical: No  Tobacco Use  . Smoking status: Current Some Day Smoker    Types: Cigars  . Smokeless tobacco: Never Used  Substance and Sexual Activity  . Alcohol use: Yes    Alcohol/week: 0.0 standard drinks    Comment: occasionally  . Drug use: No  . Sexual activity: Yes    Partners: Female  Lifestyle  . Physical activity:    Days per week: 5 days    Minutes per session: 30 min  . Stress: Not at all  Relationships  . Social connections:    Talks on phone: More than three times a week    Gets together: Once a week    Attends religious service: Never    Active member of club or organization: No    Attends meetings of  clubs or organizations: Never    Relationship status: Married  . Intimate partner violence:    Fear of current or ex partner: No    Emotionally abused: No    Physically abused: No    Forced sexual activity: No  Other Topics Concern  . Not on file  Social History Narrative  . Not on file     Current Outpatient Medications:  .  atorvastatin (LIPITOR) 40 MG tablet, Take 1 tablet (40 mg total) by mouth daily at 6 PM., Disp: 90 tablet, Rfl: 1 .  Cetirizine HCl (ZYRTEC ALLERGY) 10 MG CAPS, Take 1 capsule (10 mg total) by mouth daily., Disp: 90 capsule, Rfl: 3 .  famotidine (PEPCID) 20 MG tablet, Take by mouth as needed. , Disp: , Rfl:  .  fluticasone (FLONASE) 50 MCG/ACT nasal spray, Place 2 sprays into both nostrils daily., Disp: 16 g, Rfl: 2 .  omeprazole (PRILOSEC) 20 MG capsule, TAKE 1 BY MOUTH DAILY, Disp: 90 capsule, Rfl: 0 .  tiZANidine (ZANAFLEX) 4 MG capsule, Take 1 capsule (4 mg total) by mouth 2 (two) times daily., Disp: 30 capsule, Rfl: 1 .  predniSONE (STERAPRED UNI-PAK 48 TAB) 5 MG (48) TBPK tablet, Take as directed (Patient not taking: Reported on 08/28/2018), Disp: 48 tablet, Rfl: 0  No Known Allergies  I personally reviewed active problem list, medication list, allergies, health maintenance, notes from last encounter, lab results with the patient/caregiver today.   ROS  Constitutional: Negative for fever or weight change.  Respiratory: Negative for cough and shortness of breath.   Cardiovascular: Negative for chest pain or palpitations.  Gastrointestinal: Negative for abdominal pain, no bowel changes.  Musculoskeletal: Negative for gait problem or joint swelling.  Skin: Negative for rash.  Neurological: Negative for dizziness or headache.  No other specific complaints in a complete review of systems (except as listed in HPI above).  Objective  Vitals:   10/26/18 0735  BP: 130/80  Pulse: 62  Resp: 16  Temp: 98 F (36.7 C)  TempSrc: Oral  SpO2: 97%  Weight:  230 lb 8 oz (104.6 kg)  Height: 6\' 2"  (1.88 m)   Body mass index is 29.59 kg/m.  Physical Exam  Constitutional: Patient appears well-developed and well-nourished. No distress.  HENT: Head: Normocephalic and atraumatic.Eyes: Conjunctivae and EOM are normal. No scleral icterus. Neck: Normal range of motion. Neck supple. No JVD present. No thyromegaly present.  Cardiovascular: Normal rate, regular rhythm and normal heart sounds.  No murmur heard. No BLE edema. Pulmonary/Chest: Effort normal and breath sounds normal. No respiratory distress. Musculoskeletal: Normal range of motion,  no joint effusions. No gross deformities Neurological: Pt is alert and oriented to person, place, and time. No cranial nerve deficit. Coordination, balance, strength, speech and gait are normal.  Skin: Skin is warm and dry. No rash noted. No erythema.  Psychiatric: Patient has a normal mood and affect. behavior is normal. Judgment and thought content normal.  No results found for this or any previous visit (from the past 72 hour(s)).  PHQ2/9: Depression screen [facility] 2/9 10/26/2018 07/24/2018 06/23/2018 04/14/2018 08/09/2017  Decreased Interest 0 0 0 0 0  Down, Depressed, Hopeless 0 0 0 0 0  PHQ - 2 Score 0 0 0 0 0  Altered sleeping 0 0 0 0 -  Tired, decreased energy 0 0 0 0 -  Change in appetite 0 0 0 0 -  Feeling bad or failure about yourself  0 0 0 0 -  Trouble concentrating 0 0 0 0 -  Moving slowly or fidgety/restless 0 0 0 0 -  Suicidal thoughts 0 0 0 0 -  PHQ-9 Score 0 0 0 0 -  Difficult doing work/chores Not difficult at all Not difficult at all Not difficult at all Not difficult at all -   PHQ-2/9 Result is negative.    Fall Risk: Fall Risk  10/26/2018 07/24/2018 06/23/2018 04/14/2018 08/09/2017  Falls in the past year? 0 0 0 No No  Number falls in past yr: 0 - 0 - -  Injury with Fall? 0 - 0 - -  Follow up Falls evaluation completed - - - -   Assessment & Plan  1. Seasonal allergic rhinitis,  unspecified trigger - levocetirizine (XYZAL) 5 MG tablet; Take 1 tablet (5 mg total) by mouth every evening.  Dispense: 90 tablet; Refill: 1 - Continue flonase  2. Neck stiffness - tiZANidine (ZANAFLEX) 4 MG capsule; Take 1 capsule (4 mg total) by mouth 2 (two) times daily.  Dispense: 90 capsule; Refill: 0 - Home exercises as PT was too expensive  3. Overweight (BMI 25.0-29.9) - Lipid panel - COMPLETE METABOLIC PANEL WITH GFR - CBC w/Diff/Platelet  4. Gastroesophageal reflux disease without esophagitis - COMPLETE METABOLIC PANEL WITH GFR  5. Dyslipidemia - atorvastatin (LIPITOR) 40 MG tablet; Take 1 tablet (40 mg total) by mouth daily at 6 PM.  Dispense: 90 tablet; Refill: 1 - aspirin EC 81 MG tablet; Take 1 tablet (81 mg total) by mouth daily.  Dispense: 90 tablet; Refill: 1 - Lipid panel - COMPLETE METABOLIC PANEL WITH GFR  6. History of transient ischemic attack (TIA) - aspirin EC 81 MG tablet; Take 1 tablet (81 mg total) by mouth daily.  Dispense: 90 tablet; Refill: 1 - Lipid panel  7. Smokes cigars - Advised cessation, not ready.  8. Obstructive sleep apnea - CBC w/Diff/Platelet

## 2018-10-27 ENCOUNTER — Encounter: Payer: Self-pay | Admitting: Internal Medicine

## 2018-10-27 ENCOUNTER — Other Ambulatory Visit: Payer: Self-pay | Admitting: Family Medicine

## 2018-10-27 ENCOUNTER — Encounter: Payer: Managed Care, Other (non HMO) | Admitting: Physical Therapy

## 2018-10-27 ENCOUNTER — Ambulatory Visit (INDEPENDENT_AMBULATORY_CARE_PROVIDER_SITE_OTHER): Payer: Managed Care, Other (non HMO) | Admitting: Internal Medicine

## 2018-10-27 VITALS — BP 124/82 | HR 60 | Ht 74.0 in | Wt 231.0 lb

## 2018-10-27 DIAGNOSIS — G4733 Obstructive sleep apnea (adult) (pediatric): Secondary | ICD-10-CM

## 2018-10-27 MED ORDER — ATORVASTATIN CALCIUM 80 MG PO TABS: 80.0000 mg | ORAL_TABLET | Freq: Every day | ORAL | 1 refills | Status: DC

## 2018-10-27 NOTE — Patient Instructions (Signed)
Will refer to mask fitting clinic to try a new "hybrid" mask.  If you would like to start a new machine let us know and we can refer you for a new home sleep study.

## 2018-10-27 NOTE — Addendum Note (Signed)
Addended by: Maryanna Shape A on: 10/27/2018 09:13 AM   Modules accepted: Orders

## 2018-10-27 NOTE — Progress Notes (Signed)
[facility] Consultation      Assessment and Plan:  Obstructive sleep apnea. - History of OSA diagnosed remotely.  Now with continued symptoms of daytime sleepiness, snoring, witnessed apneas.  Patient has a machine and mask however they do not fit well currently with excessive leaks due to mouth breathing. - Discussed that there we can refer him to the mask fitting clinic to try new mask such as a "hybrid mask". -Additionally we can consider sending him for a new home sleep test in the future if we need to keep better track of his download data.  Return in about 3 months (around 01/27/2019).    Date: 10/27/2018  MRN# [id number] Curtis Underwood 1956/01/02    Curtis Underwood is a 63 y.o. old male seen in consultation for chief complaint of:    Chief Complaint  Patient presents with  . sleep consult    per [name]- prior sleep study 7-8 years ago. not currently wearing cpap due to mask trouble. c/o loud snoring.    HPI:   The patient is a 63 year old male with complaints of daytime sleepiness.  He goes to bed at 8 AM, falls asleep quickly, gets up out of bed at 3:30 PM. He was diagnosed with OSA about 7 or 8 years ago. At that time he was having loud snoring and he went for a sleep study in the sleep lab. He got a CPAP machine and started using it with a full face mask, which he struggled with. He switched to a nasal mask but he was woken up because his mouth would open and he would wake up. He stopped using it after about 6 to 7 months.   He started trying to use it again due to snoring but could not sleep with it. His wife notes that he snores loudly and stops breathing in his sleep.  He works 3rd shift at [employer].   His weight has increased about 10 pounds since the original sleep study. Denies sleep paralysis, sleep walking, no cataplexy, no TMJ. He denies jaw pain.    PMHX:   Past Medical History:  Diagnosis Date  . Hyperlipidemia   . Sleep apnea     Surgical Hx:  Past Surgical History:  Procedure Laterality Date  . APPENDECTOMY     Family Hx:  Family History  Adopted: Yes  Family history unknown: Yes   Social Hx:   Social History   Tobacco Use  . Smoking status: Current Some Day Smoker    Types: Cigars  . Smokeless tobacco: Never Used  Substance Use Topics  . Alcohol use: Yes    Alcohol/week: 0.0 standard drinks    Comment: occasionally  . Drug use: No   Medication:    Current Outpatient Medications:  .  aspirin EC 81 MG tablet, Take 1 tablet (81 mg total) by mouth daily., Disp: 90 tablet, Rfl: 1 .  atorvastatin (LIPITOR) 40 MG tablet, Take 1 tablet (40 mg total) by mouth daily at 6 PM., Disp: 90 tablet, Rfl: 1 .  famotidine (PEPCID) 20 MG tablet, Take by mouth as needed. , Disp: , Rfl:  .  fluticasone (FLONASE) 50 MCG/ACT nasal spray, Place 2 sprays into both nostrils daily., Disp: 16 g, Rfl: 2 .  levocetirizine (XYZAL) 5 MG tablet, Take 1 tablet (5 mg total) by mouth every evening., Disp: 90 tablet, Rfl: 1 .  omeprazole (PRILOSEC) 20 MG capsule, TAKE 1 BY MOUTH DAILY, Disp: 90 capsule, Rfl: 0 .  tiZANidine (ZANAFLEX) 4 MG capsule, Take 1 capsule (4 mg total) by mouth 2 (two) times daily., Disp: 90 capsule, Rfl: 0   Allergies:  Patient has no known allergies.  Review of Systems: Gen:  Denies  fever, sweats, chills HEENT: Denies blurred vision, double vision. bleeds, sore throat Cvc:  No dizziness, chest pain. Resp:   Denies cough or sputum production, shortness of breath Gi: Denies swallowing difficulty, stomach pain. Gu:  Denies bladder incontinence, burning urine Ext:   No Joint pain, stiffness. Skin: No skin rash,  hives  Endoc:  No polyuria, polydipsia. Psych: No depression, insomnia. Other:  All other systems were reviewed with the patient and were negative other that what is mentioned in the HPI.   Physical Examination:   VS: BP 124/82 (BP Location: Left Arm, Cuff Size: Normal)   Pulse 60   Ht 6\' 2"   (1.88 m)   Wt 231 lb (104.8 kg)   SpO2 96%   BMI 29.66 kg/m   General Appearance: No distress  Neuro:without focal findings,  speech normal,  HEENT: PERRLA, EOM intact.   Pulmonary: normal breath sounds, No wheezing.  CardiovascularNormal S1,S2.  No m/r/g.   Abdomen: Benign, Soft, non-tender. Renal:  No costovertebral tenderness  GU:  No performed at this time. Endoc: No evident thyromegaly, no signs of acromegaly. Skin:   warm, no rashes, no ecchymosis  Extremities: normal, no cyanosis, clubbing.  Other findings:    LABORATORY PANEL:   CBC Recent Labs  Lab 10/26/18 0811  WBC 8.2  HGB 15.3  HCT 44.8  PLT 216   ------------------------------------------------------------------------------------------------------------------  Chemistries  Recent Labs  Lab 10/26/18 0811  NA 139  K 4.1  CL 103  CO2 29  GLUCOSE 89  BUN 12  CREATININE 1.13  CALCIUM 9.4  AST 25  ALT 41  BILITOT 0.4   ------------------------------------------------------------------------------------------------------------------  Cardiac Enzymes No results for input(s): TROPONINI in the last 168 hours. ------------------------------------------------------------  RADIOLOGY:  No results found.     Thank  you for the consultation and for allowing [facility] to assist in the care of your patient. Our recommendations are noted above.  Please contact us if we can be of further service.   Marda Stalker, M.D., F.C.C.P.  Board Certified in Internal Medicine, Pulmonary Medicine, [place], and Sleep Medicine.  [facility] Office Number: [phone]   10/27/2018

## 2018-10-28 ENCOUNTER — Other Ambulatory Visit: Payer: Self-pay | Admitting: Family Medicine

## 2018-10-28 DIAGNOSIS — M436 Torticollis: Secondary | ICD-10-CM

## 2018-10-30 ENCOUNTER — Other Ambulatory Visit: Payer: Self-pay | Admitting: Emergency Medicine

## 2018-10-30 DIAGNOSIS — M436 Torticollis: Secondary | ICD-10-CM

## 2018-10-30 MED ORDER — TIZANIDINE HCL 4 MG PO CAPS: 4.0000 mg | ORAL_CAPSULE | Freq: Two times a day (BID) | ORAL | 0 refills | Status: DC | PRN

## 2018-10-30 NOTE — Telephone Encounter (Signed)
Patient want 90 day supply

## 2018-10-31 ENCOUNTER — Other Ambulatory Visit: Payer: Self-pay | Admitting: Family Medicine

## 2018-10-31 DIAGNOSIS — J302 Other seasonal allergic rhinitis: Secondary | ICD-10-CM

## 2018-12-15 ENCOUNTER — Other Ambulatory Visit (HOSPITAL_BASED_OUTPATIENT_CLINIC_OR_DEPARTMENT_OTHER): Payer: Managed Care, Other (non HMO)

## 2018-12-16 ENCOUNTER — Telehealth: Payer: Managed Care, Other (non HMO) | Admitting: Nurse Practitioner

## 2018-12-16 DIAGNOSIS — J302 Other seasonal allergic rhinitis: Secondary | ICD-10-CM

## 2018-12-16 DIAGNOSIS — J00 Acute nasopharyngitis [common cold]: Secondary | ICD-10-CM

## 2018-12-16 MED ORDER — BENZONATATE 100 MG PO CAPS: 100.0000 mg | ORAL_CAPSULE | Freq: Three times a day (TID) | ORAL | 0 refills | Status: DC | PRN

## 2018-12-16 MED ORDER — FLUTICASONE PROPIONATE 50 MCG/ACT NA SUSP: NASAL | 6 refills | Status: DC

## 2018-12-16 NOTE — Progress Notes (Signed)
We are sorry you are not feeling well.  Here is how we plan to help!  Based on what you have shared with me, it looks like you may have a viral upper respiratory infection.  Upper respiratory infections are caused by a large number of viruses; however, rhinovirus is the most common cause.   Symptoms vary from person to person, with common symptoms including sore throat, cough, and fatigue or lack of energy.  A low-grade fever of up to 100.4 may present, but is often uncommon.  Symptoms vary however, and are closely related to a person's age or underlying illnesses.  The most common symptoms associated with an upper respiratory infection are nasal discharge or congestion, cough, sneezing, headache and pressure in the ears and face.  These symptoms usually persist for about 3 to 10 days, but can last up to 2 weeks.  It is important to know that upper respiratory infections do not cause serious illness or complications in most cases.    Upper respiratory infections can be transmitted from person to person, with the most common method of transmission being a person's hands.  The virus is able to live on the skin and can infect other persons for up to 2 hours after direct contact.  Also, these can be transmitted when someone coughs or sneezes; thus, it is important to cover the mouth to reduce this risk.  To keep the spread of the illness at bay, good hand hygiene is very important.  This is an infection that is most likely caused by a virus. There are no specific treatments other than to help you with the symptoms until the infection runs its course.  We are sorry you are not feeling well.  Here is how we plan to help!   For nasal congestion, you may use an oral decongestants such as Mucinex D or if you have glaucoma or high blood pressure use plain Mucinex.  Saline nasal spray or nasal drops can help and can safely be used as often as needed for congestion.  For your congestion, I have prescribed Fluticasone  nasal spray one spray in each nostril twice a day  If you do not have a history of heart disease, hypertension, diabetes or thyroid disease, prostate/bladder issues or glaucoma, you may also use Sudafed to treat nasal congestion.  It is highly recommended that you consult with a pharmacist or your primary care physician to ensure this medication is safe for you to take.     If you have a cough, you may use cough suppressants such as Delsym and Robitussin.  If you have glaucoma or high blood pressure, you can also use Coricidin HBP.   For cough I have prescribed for you A prescription cough medication called Tessalon Perles 100 mg. You may take 1-2 capsules every 8 hours as needed for cough  If you have a sore or scratchy throat, use a saltwater gargle-  to  teaspoon of salt dissolved in a 4-ounce to 8-ounce glass of warm water.  Gargle the solution for approximately 15-30 seconds and then spit.  It is important not to swallow the solution.  You can also use throat lozenges/cough drops and Chloraseptic spray to help with throat pain or discomfort.  Warm or cold liquids can also be helpful in relieving throat pain.  For headache, pain or general discomfort, you can use Ibuprofen or Tylenol as directed.   Some authorities believe that zinc sprays or the use of Echinacea may shorten the   course of your symptoms.   HOME CARE . Only take medications as instructed by your medical team. . Be sure to drink plenty of fluids. Water is fine as well as fruit juices, sodas and electrolyte beverages. You may want to stay away from caffeine or alcohol. If you are nauseated, try taking small sips of liquids. How do you know if you are getting enough fluid? Your urine should be a pale yellow or almost colorless. . Get rest. . Taking a steamy shower or using a humidifier may help nasal congestion and ease sore throat pain. You can place a towel over your head and breathe in the steam from hot water coming from a  faucet. . Using a saline nasal spray works much the same way. . Cough drops, hard candies and sore throat lozenges may ease your cough. . Avoid close contacts especially the very young and the elderly . Cover your mouth if you cough or sneeze . Always remember to wash your hands.   GET HELP RIGHT AWAY IF: . You develop worsening fever. . If your symptoms do not improve within 10 days . You develop yellow or green discharge from your nose over 3 days. . You have coughing fits . You develop a severe head ache or visual changes. . You develop shortness of breath, difficulty breathing or start having chest pain . Your symptoms persist after you have completed your treatment plan  MAKE SURE YOU   Understand these instructions.  Will watch your condition.  Will get help right away if you are not doing well or get worse.  Your e-visit answers were reviewed by a board certified advanced clinical practitioner to complete your personal care plan. Depending upon the condition, your plan could have included both over the counter or prescription medications. Please review your pharmacy choice. If there is a problem, you may call our nursing hot line at and have the prescription routed to another pharmacy. Your safety is important to [place]. If you have drug allergies check your prescription carefully.   You can use MyChart to ask questions about today's visit, request a non-urgent call back, or ask for a work or school excuse for 24 hours related to this e-Visit. If it has been greater than 24 hours you will need to follow up with your provider, or enter a new e-Visit to address those concerns. You will get an e-mail in the next two days asking about your experience.  I hope that your e-visit has been valuable and will speed your recovery. Thank you for using e-visits.  5-10 minutes spent reviewing and documenting in chart.

## 2018-12-24 ENCOUNTER — Other Ambulatory Visit: Payer: Self-pay | Admitting: Family Medicine

## 2018-12-24 DIAGNOSIS — K219 Gastro-esophageal reflux disease without esophagitis: Secondary | ICD-10-CM

## 2019-01-24 ENCOUNTER — Telehealth: Payer: Self-pay | Admitting: Internal Medicine

## 2019-01-24 NOTE — Telephone Encounter (Signed)
Pt wanted to let us know that he still haven't gotten fitted for his mask, so he wanted to cancel his office appt. He spoke with company and they had to cancel his fitting due to [place]. No idea of when they will be able to fit him, wondered if we knew of when. Pt stated that he will reschedule when he gets his mask. Nothing further needed

## 2019-01-26 ENCOUNTER — Ambulatory Visit: Payer: Managed Care, Other (non HMO) | Admitting: Internal Medicine

## 2019-03-07 IMAGING — CT CT HEAD W/O CM
3 series · 15 of 47 positions shown, 18 images · non-contrast
Comparison: None.

CLINICAL DATA: Pt arrives POV with c/o left sided numbness within
the last fifteen minutes. Pt is experiencing light headedness at
this time with no neurologic defects

EXAM:
CT HEAD WITHOUT CONTRAST
TECHNIQUE: Contiguous axial images were obtained from the base of the skull
through the vertex without intravenous contrast.

[Series 3: head wo · axial · 0.45mm/px · z∈[+346,+481]mm · 9 of 33 slices shown, 12 images]
[im 3/33  brain]
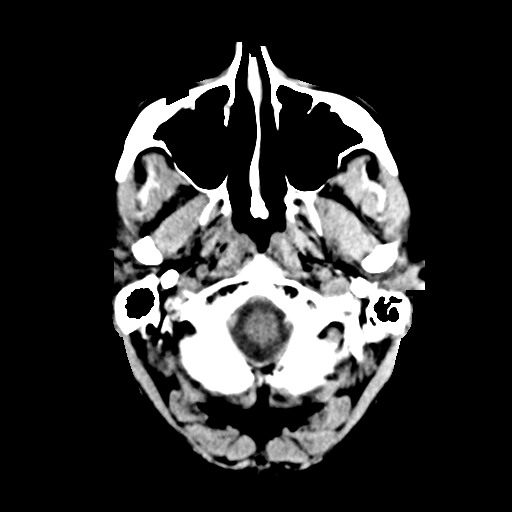
[im 3/33  bone]
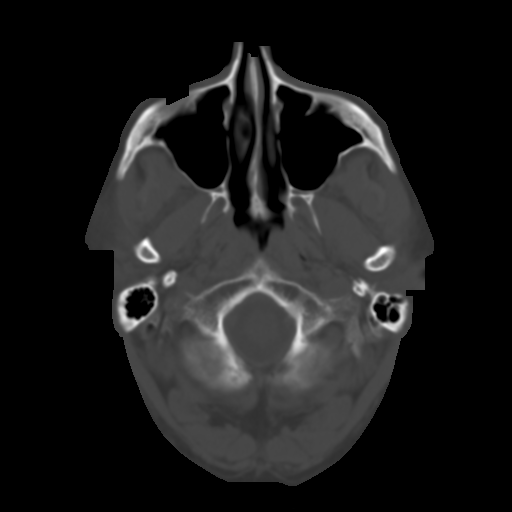
[im 6/33  brain]
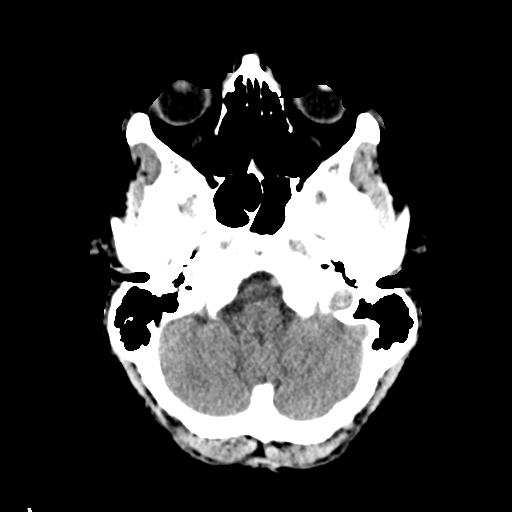
[im 9/33  brain]
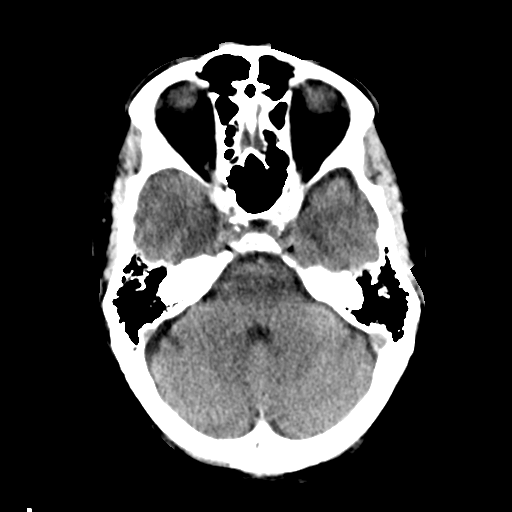
[im 13/33  brain]
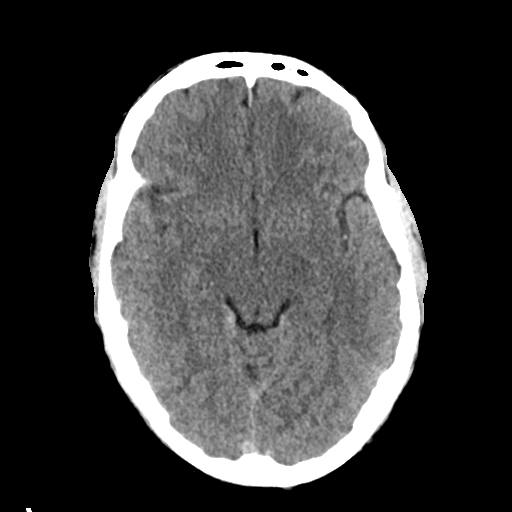
[im 17/33  brain]
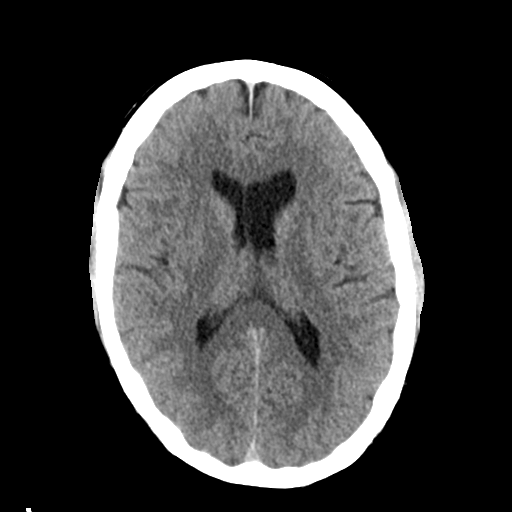
[im 17/33  bone]
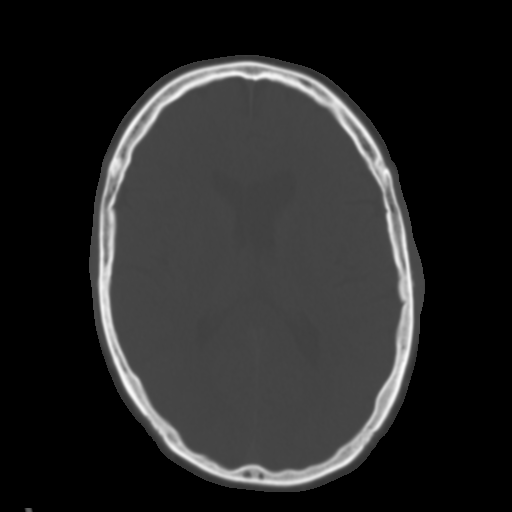
[im 20/33  brain]
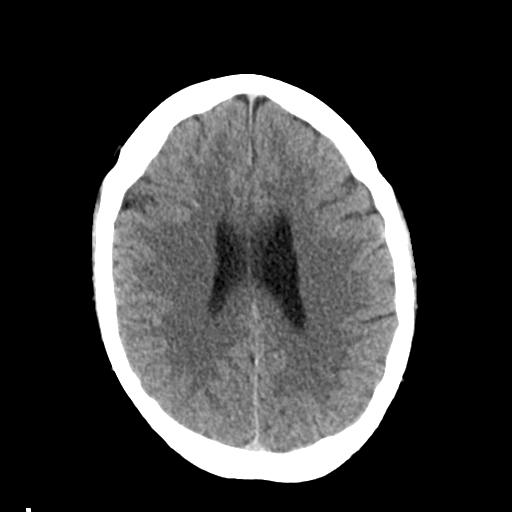
[im 24/33  brain]
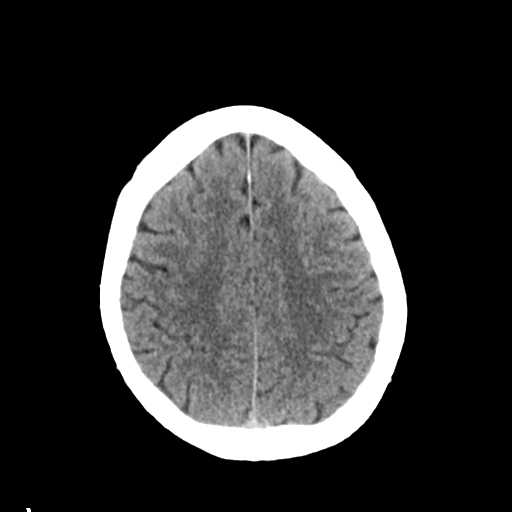
[im 27/33  brain]
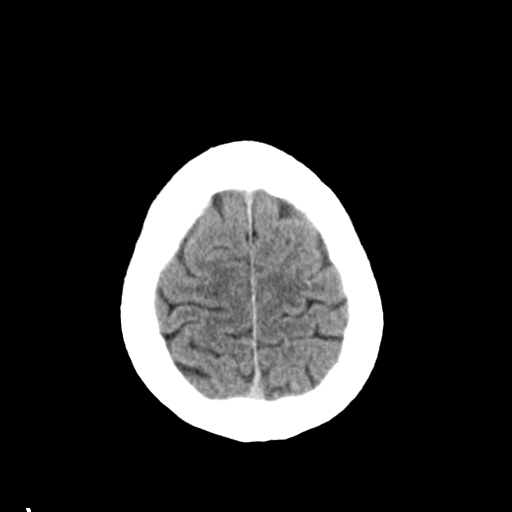
[im 30/33  brain]
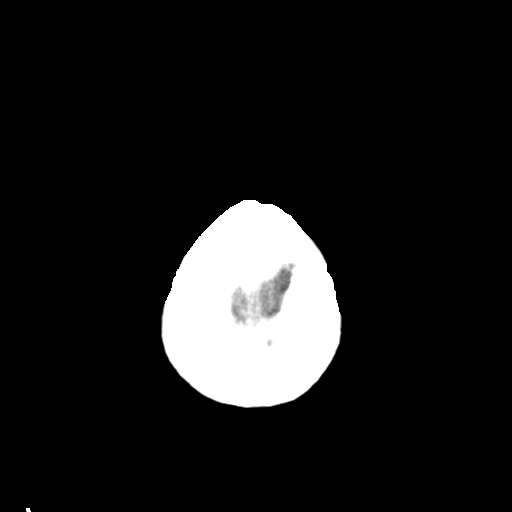
[im 30/33  bone]
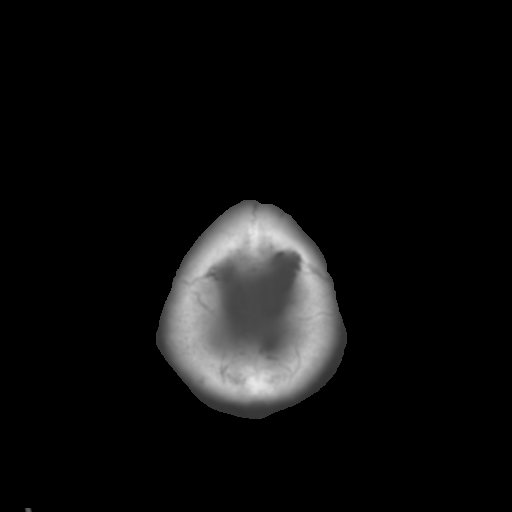

[Series 4: coronal soft tissue · coronal · 0.35mm/px · 3 of 73 slices shown]
[im 25/73  brain]
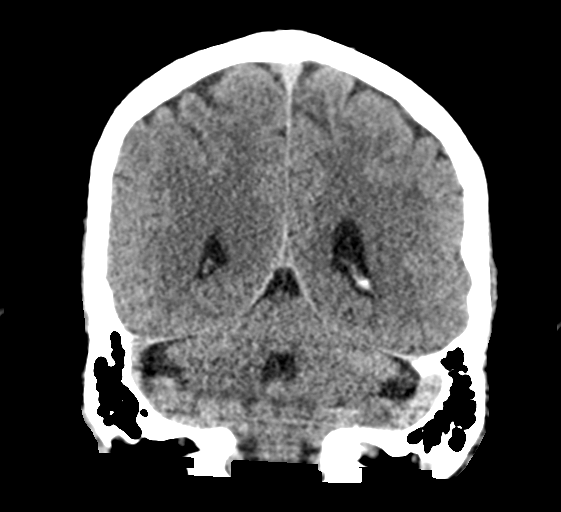
[im 33/73  brain]
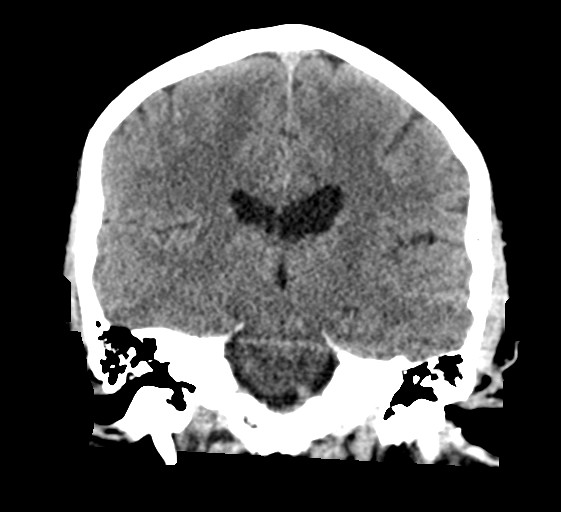
[im 41/73  brain]
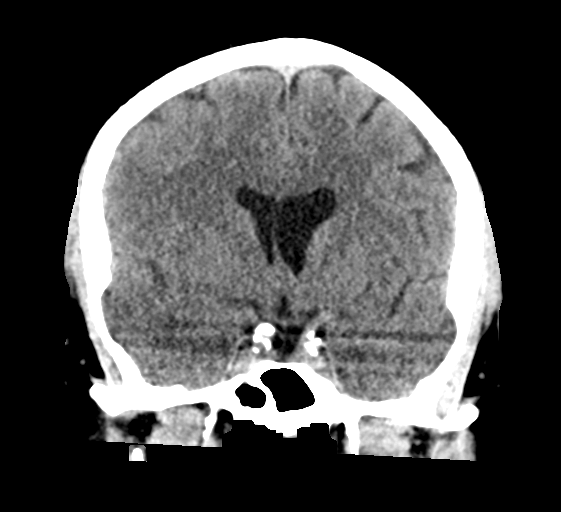

[Series 5: sagittal soft tissue · sagittal · 0.36mm/px · 3 of 55 slices shown]
[im 19/55  brain]
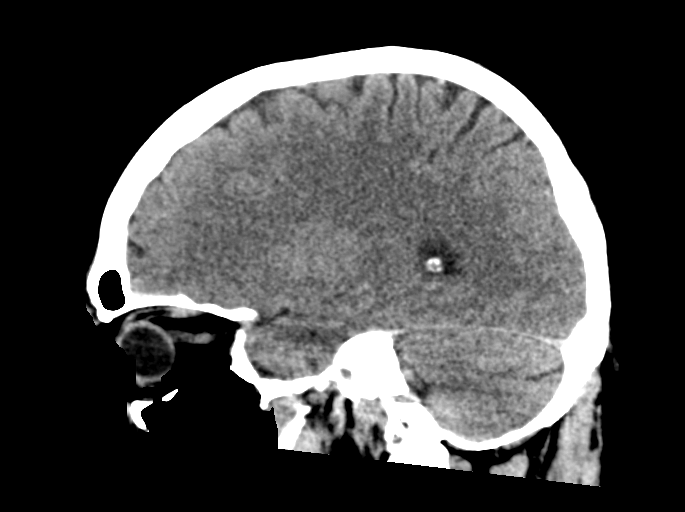
[im 28/55  brain]
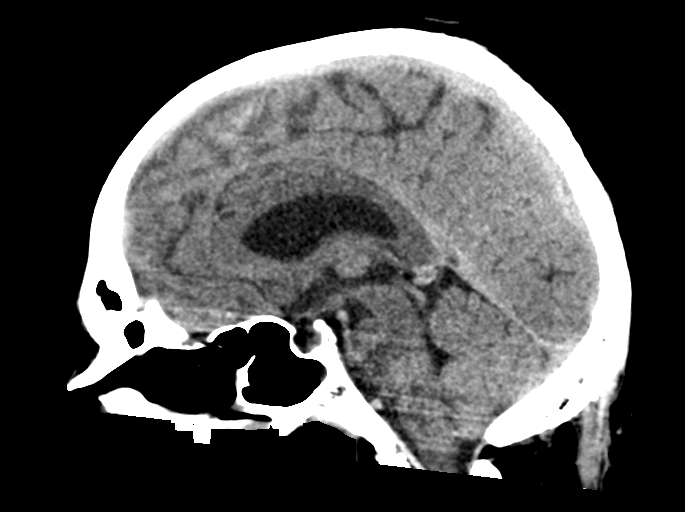
[im 37/55  brain]
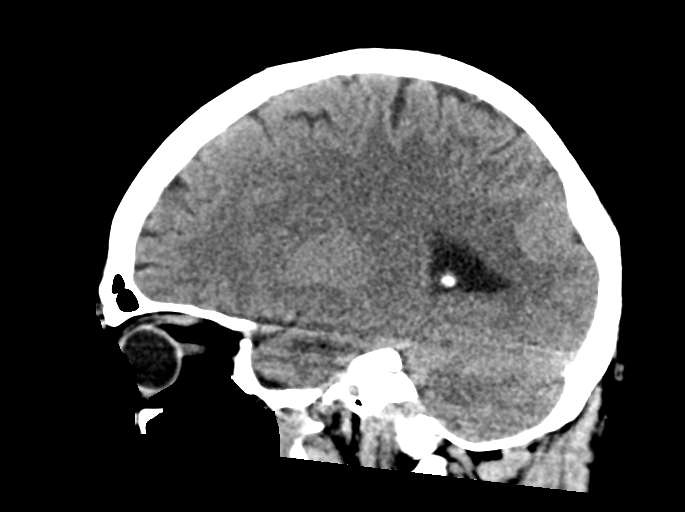

[15 of 47 positions shown; findings below may reference images not displayed]

FINDINGS: Brain: No acute intracranial hemorrhage. No focal mass lesion. No CT
evidence of acute infarction. No midline shift or mass effect. No
hydrocephalus. Basilar cisterns are patent.

Vascular: No hyperdense vessel or unexpected calcification.

Skull: Normal. Negative for fracture or focal lesion.

Sinuses/Orbits: Paranasal sinuses and mastoid air cells are clear.
Orbits are clear.

Other: None.
IMPRESSION: 1. No CT evidence of cortical infarction.
2. No acute intracranial findings.

## 2019-03-28 ENCOUNTER — Other Ambulatory Visit: Payer: Self-pay | Admitting: Family Medicine

## 2019-03-28 DIAGNOSIS — K219 Gastro-esophageal reflux disease without esophagitis: Secondary | ICD-10-CM

## 2019-04-20 ENCOUNTER — Other Ambulatory Visit: Payer: Self-pay | Admitting: Family Medicine

## 2019-04-20 DIAGNOSIS — Z8673 Personal history of transient ischemic attack (TIA), and cerebral infarction without residual deficits: Secondary | ICD-10-CM

## 2019-04-20 DIAGNOSIS — E785 Hyperlipidemia, unspecified: Secondary | ICD-10-CM

## 2019-05-03 ENCOUNTER — Encounter: Payer: Self-pay | Admitting: Family Medicine

## 2019-05-03 ENCOUNTER — Other Ambulatory Visit: Payer: Self-pay

## 2019-05-03 ENCOUNTER — Ambulatory Visit (INDEPENDENT_AMBULATORY_CARE_PROVIDER_SITE_OTHER): Payer: Managed Care, Other (non HMO) | Admitting: Family Medicine

## 2019-05-03 VITALS — BP 138/90 | HR 71 | Temp 97.9°F | Resp 16 | Ht 74.0 in | Wt 237.3 lb

## 2019-05-03 DIAGNOSIS — G5761 Lesion of plantar nerve, right lower limb: Secondary | ICD-10-CM

## 2019-05-03 DIAGNOSIS — K130 Diseases of lips: Secondary | ICD-10-CM

## 2019-05-03 DIAGNOSIS — J302 Other seasonal allergic rhinitis: Secondary | ICD-10-CM

## 2019-05-03 DIAGNOSIS — G4733 Obstructive sleep apnea (adult) (pediatric): Secondary | ICD-10-CM

## 2019-05-03 DIAGNOSIS — E663 Overweight: Secondary | ICD-10-CM

## 2019-05-03 DIAGNOSIS — M436 Torticollis: Secondary | ICD-10-CM | POA: Diagnosis not present

## 2019-05-03 DIAGNOSIS — E785 Hyperlipidemia, unspecified: Secondary | ICD-10-CM

## 2019-05-03 DIAGNOSIS — K219 Gastro-esophageal reflux disease without esophagitis: Secondary | ICD-10-CM

## 2019-05-03 DIAGNOSIS — F17290 Nicotine dependence, other tobacco product, uncomplicated: Secondary | ICD-10-CM

## 2019-05-03 DIAGNOSIS — Z8673 Personal history of transient ischemic attack (TIA), and cerebral infarction without residual deficits: Secondary | ICD-10-CM

## 2019-05-03 DIAGNOSIS — R0982 Postnasal drip: Secondary | ICD-10-CM

## 2019-05-03 HISTORY — DX: Diseases of lips: K13.0

## 2019-05-03 MED ORDER — TIZANIDINE HCL 4 MG PO CAPS: 4.0000 mg | ORAL_CAPSULE | Freq: Two times a day (BID) | ORAL | 0 refills | Status: DC | PRN

## 2019-05-03 MED ORDER — AZELASTINE HCL 0.1 % NA SOLN: 2.0000 | Freq: Two times a day (BID) | NASAL | 12 refills | Status: DC

## 2019-05-03 NOTE — Progress Notes (Addendum)
Name: Curtis Underwood   MRN: [id number]    DOB: 27-Aug-1955   Date:05/03/2019       Progress Note  Subjective  Chief Complaint  Chief Complaint  Patient presents with  . Follow-up    6 Month  . Medication Refill    Tizanidine    HPI  AR: Using Zyrtec and flonase in the spring due to [place].  Not working as well as it used to [employer] was too expensive so he has still been using Zyretec - recommend switching to allegra or Claritin for a period of time; will also add azelastine.   Neck Pain: He did attend PT for neck pain, but it was very expensive and only able to go to 3 sessions.  Was given exercises to do at home and he has been doing them during his night shift hours at work. Tizanidine is really helping him.  Overweight: He is walking 1.5 miles daily; was at the beach and golfed and walked. Does not eat out frequently, eats fairly balanced diet - works weekend first shift for [employer]. Body mass index is 30.47 kg/m.   GERD: Spicy food, red sauce, onions is a trigger; taking omeprazole or famotadine PRN (once every 2-3 weeks). Discussed risk of long-term PPI use. No dysphagia, denies chest pain, abdominal pain, nausea, vomiting, blood in stool, or dark and tarry stools. Does not eat late at night. Stable and unchanged.   OSA: Seeing Dr. Ashby Dawes tomorrow, not using his machine because he needs a new mask and new evaluation. He still snores very loudly, sleeps during the day except on the weekends due to being on nightshift. Needs to have his mask fitted prior to starting CPAP use - this was canceled initially due to COVID-19 pandemic.  Smoking: Smokes cigars on the weekends; discussed cessation x2 minutes and he is not ready to quit at this time.  No changes.   Hx TIA: He was referred to neurologist but didn't go, he does not want to go now due to being too busy. Has not had any more episodes. He is taking lipitor, taking ASA 81mg .   Hyperlipidemia: Taking lipitor 40mg  (has  history of myalgias with other statins), no myalgias today, denies chest pains, shortness of breath. ECG is on file.  Morton's Neuroma: RIGHT pad of foot feels like a rock is under it on occasion in the last 6-7 months.  Would like to see podiatry.  Patient Active Problem List   Diagnosis Date Noted  . Overweight (BMI 25.0-29.9) 07/24/2018  . Diabetes mellitus screening 07/24/2018  . History of transient ischemic attack (TIA) 04/14/2018  . Acid reflux 05/26/2015  . Allergic rhinitis 05/26/2015  . Dyslipidemia 05/26/2015  . Smokes cigars 05/26/2015  . Obstructive sleep apnea 05/26/2015  . PND (post-nasal drip) 05/26/2015  . Neck stiffness 05/26/2015    Past Surgical History:  Procedure Laterality Date  . APPENDECTOMY      Family History  Adopted: Yes  Family history unknown: Yes    Social History   Socioeconomic History  . Marital status: Married    Spouse name: MaryBeth  . Number of children: 2  . Years of education: 75  . Highest education level: Some college, no degree  Occupational History  . Not on file  Social Needs  . Financial resource strain: Not hard at all  . Food insecurity    Worry: Never true    Inability: Never true  . Transportation needs    Medical: No  Non-medical: No  Tobacco Use  . Smoking status: Current Some Day Smoker    Types: Cigars  . Smokeless tobacco: Never Used  Substance and Sexual Activity  . Alcohol use: Yes    Alcohol/week: 0.0 standard drinks    Comment: occasionally  . Drug use: No  . Sexual activity: Yes    Partners: Female  Lifestyle  . Physical activity    Days per week: 5 days    Minutes per session: 30 min  . Stress: Not at all  Relationships  . Social connections    Talks on phone: More than three times a week    Gets together: Once a week    Attends religious service: Never    Active member of club or organization: No    Attends meetings of clubs or organizations: Never    Relationship status: Married  .  Intimate partner violence    Fear of current or ex partner: No    Emotionally abused: No    Physically abused: No    Forced sexual activity: No  Other Topics Concern  . Not on file  Social History Narrative  . Not on file     Current Outpatient Medications:  .  aspirin 81 MG EC tablet, TAKE 1 TABLET BY MOUTH EVERY DAY, Disp: 90 tablet, Rfl: 1 .  atorvastatin (LIPITOR) 80 MG tablet, TAKE 1 TABLET BY MOUTH EVERY DAY, Disp: 90 tablet, Rfl: 1 .  cetirizine (ZYRTEC) 10 MG tablet, Take 10 mg by mouth as needed for allergies., Disp: , Rfl:  .  fluticasone (FLONASE) 50 MCG/ACT nasal spray, PLACE 2 SPRAYS INTO EACH NOSTRIL EVERY DAY, Disp: 48 g, Rfl: 6 .  omeprazole (PRILOSEC) 20 MG capsule, TAKE 1 CAPSULE BY MOUTH EVERY DAY, Disp: 90 capsule, Rfl: 0 .  tiZANidine (ZANAFLEX) 4 MG capsule, Take 1 capsule (4 mg total) by mouth 2 (two) times daily as needed for muscle spasms., Disp: 90 capsule, Rfl: 0 .  benzonatate (TESSALON PERLES) 100 MG capsule, Take 1 capsule (100 mg total) by mouth 3 (three) times daily as needed. (Patient not taking: Reported on 05/03/2019), Disp: 20 capsule, Rfl: 0 .  famotidine (PEPCID) 20 MG tablet, Take by mouth as needed. , Disp: , Rfl:  .  levocetirizine (XYZAL) 5 MG tablet, Take 1 tablet (5 mg total) by mouth every evening. (Patient not taking: Reported on 05/03/2019), Disp: 90 tablet, Rfl: 1  No Known Allergies  I personally reviewed active problem list, medication list, allergies, health maintenance, notes from last encounter, lab results with the patient/caregiver today.   ROS  Constitutional: Negative for fever or weight change.  Respiratory: Negative for cough and shortness of breath.   Cardiovascular: Negative for chest pain or palpitations.  Gastrointestinal: Negative for abdominal pain, no bowel changes.  Musculoskeletal: Negative for gait problem or joint swelling.  Skin: Negative for rash.  Neurological: Negative for dizziness or headache.  No other  specific complaints in a complete review of systems (except as listed in HPI above).  Objective  Vitals:   05/03/19 0736  BP: 138/90  Pulse: 71  Resp: 16  Temp: 97.9 F (36.6 C)  SpO2: 98%  Weight: 237 lb 4.8 oz (107.6 kg)  Height: 6\' 2"  (1.88 m)   Body mass index is 30.47 kg/m.  Physical Exam  Constitutional: Patient appears well-developed and well-nourished. No distress.  HENT: Head: Normocephalic and atraumatic.  Eyes: Conjunctivae and EOM are normal. No scleral icterus.  Neck: Normal range of motion. Neck  supple. No JVD present. Cardiovascular: Normal rate, regular rhythm and normal heart sounds.  No murmur heard. No BLE edema. Pulmonary/Chest: Effort normal and breath sounds normal. No respiratory distress. Musculoskeletal: Normal range of motion, no joint effusions. No gross deformities Neurological: Pt is alert and oriented to person, place, and time. No cranial nerve deficit. Coordination, balance, strength, speech and gait are normal.  Skin: Skin is warm and dry. No rash noted. No erythema.  Psychiatric: Patient has a normal mood and affect. behavior is normal. Judgment and thought content normal.  No results found for this or any previous visit (from the past 72 hour(s)).  PHQ2/9: Depression screen [facility] 2/9 05/03/2019 10/26/2018 07/24/2018 06/23/2018 04/14/2018  Decreased Interest 0 0 0 0 0  Down, Depressed, Hopeless 0 0 0 0 0  PHQ - 2 Score 0 0 0 0 0  Altered sleeping 0 0 0 0 0  Tired, decreased energy 0 0 0 0 0  Change in appetite 0 0 0 0 0  Feeling bad or failure about yourself  0 0 0 0 0  Trouble concentrating 0 0 0 0 0  Moving slowly or fidgety/restless 0 0 0 0 0  Suicidal thoughts 0 0 0 0 0  PHQ-9 Score 0 0 0 0 0  Difficult doing work/chores Not difficult at all Not difficult at all Not difficult at all Not difficult at all Not difficult at all   PHQ-2/9 Result is negative.    Fall Risk: Fall Risk  05/03/2019 10/26/2018 07/24/2018 06/23/2018 04/14/2018   Falls in the past year? 0 0 0 0 No  Number falls in past yr: 0 0 - 0 -  Injury with Fall? 0 0 - 0 -  Follow up - Falls evaluation completed - - -   Functional Status Survey: Is the patient deaf or have difficulty hearing?: No Does the patient have difficulty seeing, even when wearing glasses/contacts?: No Does the patient have difficulty concentrating, remembering, or making decisions?: No Does the patient have difficulty walking or climbing stairs?: No Does the patient have difficulty dressing or bathing?: No Does the patient have difficulty doing errands alone such as visiting a doctor's office or shopping?: No   Assessment & Plan  1. Seasonal allergic rhinitis, unspecified trigger - Switch to allegra for a period of time.  - azelastine (ASTELIN) 0.1 % nasal spray; Place 2 sprays into both nostrils 2 (two) times daily. Use in each nostril as directed  Dispense: 30 mL; Refill: 12  2. Neck stiffness - Doing well with PRN Tizanidine - tiZANidine (ZANAFLEX) 4 MG capsule; Take 1 capsule (4 mg total) by mouth 2 (two) times daily as needed for muscle spasms.  Dispense: 90 capsule; Refill: 0  3. Overweight (BMI 25.0-29.9) - Discussed importance of 150 minutes of physical activity weekly, eat two servings of fish weekly, eat one serving of tree nuts ( cashews, pistachios, pecans, almonds.Marland Kitchen) every other day, eat 6 servings of fruit/vegetables daily and drink plenty of water and avoid sweet beverages.  - Lipid panel  4. Gastroesophageal reflux disease without esophagitis - Taking PRN meds; doing well avoiding triggers  5. Lesion of lip - Resolved  6. Obstructive sleep apnea - Need to follow up on who is to be scheduling him for mask fitting.  7. Smokes cigars - Cessation recommended, not ready to quit  8. History of transient ischemic attack (TIA) - Lipid panel - ASA and statin therapy  9. Dyslipidemia - Continue Atorvastatin 80mg  - Lipid panel  10. PND (post-nasal drip) -  azelastine (ASTELIN) 0.1 % nasal spray; Place 2 sprays into both nostrils 2 (two) times daily. Use in each nostril as directed  Dispense: 30 mL; Refill: 12  11. Morton's neuroma of right foot - Ambulatory referral to Podiatry

## 2019-05-03 NOTE — Addendum Note (Signed)
Addended by: Hubbard Hartshorn on: 05/03/2019 08:22 AM   Modules accepted: Orders

## 2019-05-04 ENCOUNTER — Encounter: Payer: Self-pay | Admitting: Family Medicine

## 2019-05-04 DIAGNOSIS — E785 Hyperlipidemia, unspecified: Secondary | ICD-10-CM

## 2019-05-04 LAB — LIPID PANEL
Cholesterol: 183 mg/dL (ref <200)
HDL: 47 mg/dL (ref >=40)
LDL Cholesterol (Calc): 114 mg/dL (calc) — ABNORMAL HIGH
Non-HDL Cholesterol (Calc): 136 mg/dL (calc) — ABNORMAL HIGH (ref <130)
Total CHOL/HDL Ratio: 3.9 (calc) (ref <5.0)
Triglycerides: 109 mg/dL (ref <150)

## 2019-05-04 MED ORDER — EZETIMIBE 10 MG PO TABS: 10.0000 mg | ORAL_TABLET | Freq: Every day | ORAL | 3 refills | Status: DC

## 2019-05-10 ENCOUNTER — Encounter: Payer: Self-pay | Admitting: Family Medicine

## 2019-05-11 ENCOUNTER — Other Ambulatory Visit: Payer: Self-pay

## 2019-05-11 ENCOUNTER — Encounter: Payer: Self-pay | Admitting: Family Medicine

## 2019-05-11 DIAGNOSIS — Z20822 Contact with and (suspected) exposure to covid-19: Secondary | ICD-10-CM

## 2019-05-13 LAB — NOVEL CORONAVIRUS, NAA: SARS-CoV-2, NAA: NOT DETECTED

## 2019-05-15 ENCOUNTER — Ambulatory Visit: Payer: Self-pay | Admitting: Podiatry

## 2019-06-11 ENCOUNTER — Other Ambulatory Visit: Payer: Self-pay | Admitting: Podiatry

## 2019-06-11 ENCOUNTER — Ambulatory Visit (INDEPENDENT_AMBULATORY_CARE_PROVIDER_SITE_OTHER): Payer: Managed Care, Other (non HMO)

## 2019-06-11 ENCOUNTER — Encounter: Payer: Self-pay | Admitting: Podiatry

## 2019-06-11 ENCOUNTER — Ambulatory Visit (INDEPENDENT_AMBULATORY_CARE_PROVIDER_SITE_OTHER): Payer: Managed Care, Other (non HMO) | Admitting: Podiatry

## 2019-06-11 ENCOUNTER — Other Ambulatory Visit: Payer: Self-pay

## 2019-06-11 VITALS — BP 167/88 | HR 63 | Resp 16

## 2019-06-11 DIAGNOSIS — D3613 Benign neoplasm of peripheral nerves and autonomic nervous system of lower limb, including hip: Secondary | ICD-10-CM | POA: Diagnosis not present

## 2019-06-11 DIAGNOSIS — M778 Other enthesopathies, not elsewhere classified: Secondary | ICD-10-CM | POA: Diagnosis not present

## 2019-06-11 MED ORDER — METHYLPREDNISOLONE 4 MG PO TBPK: ORAL_TABLET | ORAL | 0 refills | Status: DC

## 2019-06-11 MED ORDER — MELOXICAM 15 MG PO TABS: 15.0000 mg | ORAL_TABLET | Freq: Every day | ORAL | 3 refills | Status: DC

## 2019-06-11 NOTE — Progress Notes (Signed)
Subjective:  Patient ID: Curtis Underwood, male    DOB: May 01, 1956,  MRN: [id number] HPI Chief Complaint  Patient presents with  . Foot Pain    Plantar forefoot and 2nd toe right - aching, sharp shooting into 2nd toe x months, "feels like a rock", tried Aleve  . New Patient (Initial Visit)    63 y.o. male presents with the above complaint.   ROS: Denies fever chills nausea vomiting muscle aches pains calf pain back pain chest pain shortness of breath.  Past Medical History:  Diagnosis Date  . Hyperlipidemia   . Sleep apnea    Past Surgical History:  Procedure Laterality Date  . APPENDECTOMY      Current Outpatient Medications:  .  aspirin 81 MG EC tablet, TAKE 1 TABLET BY MOUTH EVERY DAY, Disp: 90 tablet, Rfl: 1 .  atorvastatin (LIPITOR) 80 MG tablet, TAKE 1 TABLET BY MOUTH EVERY DAY, Disp: 90 tablet, Rfl: 1 .  azelastine (ASTELIN) 0.1 % nasal spray, Place 2 sprays into both nostrils 2 (two) times daily. Use in each nostril as directed, Disp: 30 mL, Rfl: 12 .  cetirizine (ZYRTEC) 10 MG tablet, Take 10 mg by mouth as needed for allergies., Disp: , Rfl:  .  ezetimibe (ZETIA) 10 MG tablet, Take 1 tablet (10 mg total) by mouth daily., Disp: 90 tablet, Rfl: 3 .  famotidine (PEPCID) 20 MG tablet, Take by mouth as needed. , Disp: , Rfl:  .  fluticasone (FLONASE) 50 MCG/ACT nasal spray, PLACE 2 SPRAYS INTO EACH NOSTRIL EVERY DAY, Disp: 48 g, Rfl: 6 .  meloxicam (MOBIC) 15 MG tablet, Take 1 tablet (15 mg total) by mouth daily., Disp: 30 tablet, Rfl: 3 .  methylPREDNISolone (MEDROL DOSEPAK) 4 MG TBPK tablet, 6 day dose pack - take as directed, Disp: 21 tablet, Rfl: 0 .  omeprazole (PRILOSEC) 20 MG capsule, TAKE 1 CAPSULE BY MOUTH EVERY DAY, Disp: 90 capsule, Rfl: 0 .  tiZANidine (ZANAFLEX) 4 MG capsule, Take 1 capsule (4 mg total) by mouth 2 (two) times daily as needed for muscle spasms., Disp: 90 capsule, Rfl: 0  No Known Allergies Review of Systems Objective:   Vitals:   06/11/19  1027  BP: (!) 167/88  Pulse: 63  Resp: 16    General: Well developed, nourished, in no acute distress, alert and oriented x3   Dermatological: Skin is warm, dry and supple bilateral. Nails x 10 are well maintained; remaining integument appears unremarkable at this time. There are no open sores, no preulcerative lesions, no rash or signs of infection present.  Vascular: Dorsalis Pedis artery and Posterior Tibial artery pedal pulses are 2/4 bilateral with immedate capillary fill time. Pedal hair growth present. No varicosities and no lower extremity edema present bilateral.   Neruologic: Grossly intact via light touch bilateral. Vibratory intact via tuning fork bilateral. Protective threshold with Semmes Wienstein monofilament intact to all pedal sites bilateral. Patellar and Achilles deep tendon reflexes 2+ bilateral. No Babinski or clonus noted bilateral.   Musculoskeletal: No gross boney pedal deformities bilateral. No pain, crepitus, or limitation noted with foot and ankle range of motion bilateral. Muscular strength 5/5 in all groups tested bilateral.  Pain on palpation and end range of motion of the second metatarsophalangeal joint of the right foot mild edema in this area.  No erythema cellulitis drainage or odor.  Gait: Unassisted, Nonantalgic.    Radiographs:  Radiographs demonstrate osseously mature individual mild valgus deformity.  Elongated second metatarsal resulting in mild hammertoe  deformity.    Assessment & Plan:   Assessment: Capsulitis of the second metatarsophalangeal joint of the right foot.  Plan: Discussed appropriate shoe gear stretching exercises ice therapy shoe gear modifications.  Start him on a Medrol Dosepak to be followed by meloxicam.  I injected around the second metatarsophalangeal joint with 10 mg of Kenalog 5 mg Marcaine.  Follow-up with him in 6 weeks     Taleeya Blondin T. [place], [place]

## 2019-07-04 ENCOUNTER — Encounter: Payer: Self-pay | Admitting: Family Medicine

## 2019-07-04 DIAGNOSIS — G4733 Obstructive sleep apnea (adult) (pediatric): Secondary | ICD-10-CM

## 2019-07-10 ENCOUNTER — Encounter: Payer: Self-pay | Admitting: Internal Medicine

## 2019-07-10 ENCOUNTER — Ambulatory Visit (INDEPENDENT_AMBULATORY_CARE_PROVIDER_SITE_OTHER): Payer: Managed Care, Other (non HMO) | Admitting: Internal Medicine

## 2019-07-10 ENCOUNTER — Telehealth: Payer: Self-pay | Admitting: Internal Medicine

## 2019-07-10 ENCOUNTER — Other Ambulatory Visit: Payer: Self-pay

## 2019-07-10 DIAGNOSIS — G4733 Obstructive sleep apnea (adult) (pediatric): Secondary | ICD-10-CM | POA: Diagnosis not present

## 2019-07-10 NOTE — Telephone Encounter (Signed)
Noted.  Pt has been added to list to contact prior to 07/31/2019 to arrange covid test.  Order has been placed to [facility] to provide pt with cpap supplies.

## 2019-07-10 NOTE — Telephone Encounter (Signed)
Spoke with patient and arranged mask fit appointment for 07/31/2019 at 11:00 at [address] 3rd floor in [place]. Pt is aware of this appointment date, time location and to bring any mask patient has used with him to this appointment.  Pt will need COVID test 3-4 days prior to [place] appointment on 07/31/2019 at [facility] in [place].   Pt requested to be set up with DME company.  In network DME is [employer].  Need Order placed for DME Referral for CPAP Support and any supplies patient may need. Pt is aware of referral. Catha Gosselin

## 2019-07-10 NOTE — Progress Notes (Signed)
[facility] Consultation     I connected with the patient by telephone enabled telemedicine visit and verified that I am speaking with the correct person using two identifiers.    I discussed the limitations, risks, security and privacy concerns of performing an evaluation and management service by telemedicine and the availability of in-person appointments. I also discussed with the patient that there may be a patient responsible charge related to this service. The patient expressed understanding and agreed to proceed.  PATIENT AGREES AND CONFIRMS -YES   Other persons participating in the visit and their role in the encounter: Patient, nursing  This visit type was conducted due to national recommendations for restrictions regarding the COVID-19 Pandemic (e.g. social distancing).  This format is felt to be most appropriate for this patient at this time.  All issues noted in this document were discussed and addressed.       Date: 07/10/2019  MRN# [id number] Curtis Underwood Apr 16, 1956    Curtis Underwood is a 63 y.o. old male seen in consultation for chief complaint of:  excessive daytime sleepiness Follow up OSA  HPI:    No evidence of heart failure at this time No evidence or signs of infection at this time No respiratory distress No fevers, chills, nausea, vomiting, diarrhea No evidence of lower extremity edema No evidence hemoptysis  Weighs 232 Having problem with face mask and snoring He works 3rd shift at [employer].   Denies sleep paralysis, sleep walking, no cataplexy, no TMJ. He denies jaw pain.      PMHX:   Past Medical History:  Diagnosis Date  . Hyperlipidemia   . Sleep apnea    Surgical Hx:  Past Surgical History:  Procedure Laterality Date  . APPENDECTOMY     Family Hx:  Family History  Adopted: Yes  Family history unknown: Yes   Social Hx:   Social History   Tobacco Use  . Smoking status: Current Some Day Smoker    Types: Cigars   . Smokeless tobacco: Never Used  Substance Use Topics  . Alcohol use: Yes    Alcohol/week: 0.0 standard drinks    Comment: occasionally  . Drug use: No   Medication:    Current Outpatient Medications:  .  aspirin 81 MG EC tablet, TAKE 1 TABLET BY MOUTH EVERY DAY, Disp: 90 tablet, Rfl: 1 .  atorvastatin (LIPITOR) 80 MG tablet, TAKE 1 TABLET BY MOUTH EVERY DAY, Disp: 90 tablet, Rfl: 1 .  azelastine (ASTELIN) 0.1 % nasal spray, Place 2 sprays into both nostrils 2 (two) times daily. Use in each nostril as directed, Disp: 30 mL, Rfl: 12 .  cetirizine (ZYRTEC) 10 MG tablet, Take 10 mg by mouth as needed for allergies., Disp: , Rfl:  .  ezetimibe (ZETIA) 10 MG tablet, Take 1 tablet (10 mg total) by mouth daily., Disp: 90 tablet, Rfl: 3 .  famotidine (PEPCID) 20 MG tablet, Take by mouth as needed. , Disp: , Rfl:  .  fluticasone (FLONASE) 50 MCG/ACT nasal spray, PLACE 2 SPRAYS INTO EACH NOSTRIL EVERY DAY, Disp: 48 g, Rfl: 6 .  meloxicam (MOBIC) 15 MG tablet, Take 1 tablet (15 mg total) by mouth daily., Disp: 30 tablet, Rfl: 3 .  methylPREDNISolone (MEDROL DOSEPAK) 4 MG TBPK tablet, 6 day dose pack - take as directed, Disp: 21 tablet, Rfl: 0 .  omeprazole (PRILOSEC) 20 MG capsule, TAKE 1 CAPSULE BY MOUTH EVERY DAY, Disp: 90 capsule, Rfl: 0 .  tiZANidine (ZANAFLEX) 4  MG capsule, Take 1 capsule (4 mg total) by mouth 2 (two) times daily as needed for muscle spasms., Disp: 90 capsule, Rfl: 0   Allergies:  Patient has no known allergies.     Review of Systems:  Gen:  Denies  fever, sweats, chills weight loss  HEENT: Denies blurred vision, double vision, ear pain, eye pain, hearing loss, nose bleeds, sore throat Cardiac:  No dizziness, chest pain or heaviness, chest tightness,edema, No JVD Resp:   No cough, -sputum production, -shortness of breath,-wheezing, -hemoptysis,  Gi: Denies swallowing difficulty, stomach pain, nausea or vomiting, diarrhea, constipation, bowel incontinence Gu:  Denies  bladder incontinence, burning urine Ext:   Denies Joint pain, stiffness or swelling Skin: Denies  skin rash, easy bruising or bleeding or hives Endoc:  Denies polyuria, polydipsia , polyphagia or weight change Psych:   Denies depression, insomnia or hallucinations  Other:  All other systems negative     Assessment and Plan:  Obstructive sleep apnea Patient needs mask fitting referral  COVID-19 EDUCATION: The signs and symptoms of COVID-19 were discussed with the patient and how to seek care for testing.  The importance of social distancing was discussed today. Hand Washing Techniques and avoid touching face was advised.     MEDICATION ADJUSTMENTS/LABS AND TESTS ORDERED: Mask fitting referral    CURRENT MEDICATIONS REVIEWED AT LENGTH WITH PATIENT TODAY   Patient satisfied with Plan of action and management. All questions answered  Follow up in 6 months   Zadia Uhde Patricia Pesa, M.D.  [facility]  Medical Director [place] Director [facility] Cardio-P[facility] Department       - History of OSA diagnosed remotely.  Now with continued symptoms of daytime sleepiness, snoring, witnessed apneas.  Patient has a machine and mask however they do not fit well currently with excessive leaks due to mouth breathing. - Discussed that there we can refer him to the mask fitting clinic to try new mask such as a "hybrid mask". -Additionally we can consider sending him for a new home sleep test in the future if we need to keep better track of his download data.

## 2019-07-10 NOTE — Patient Instructions (Addendum)
  MEDICATION ADJUSTMENTS/LABS AND TESTS ORDERED: Mask fitting referral ASAP Patient requests appointment this year

## 2019-07-10 NOTE — Addendum Note (Signed)
Addended by: Maryanna Shape A on: 07/10/2019 09:03 AM   Modules accepted: Orders

## 2019-07-21 ENCOUNTER — Other Ambulatory Visit: Payer: Self-pay | Admitting: Family Medicine

## 2019-07-21 DIAGNOSIS — M436 Torticollis: Secondary | ICD-10-CM

## 2019-07-21 NOTE — Telephone Encounter (Signed)
Requested medication (s) are due for refill today: yes  Requested medication (s) are on the active medication list:yes  Last refill:  05/29/2019  Future visit scheduled: yes  Notes to clinic:  refill cannot be delegated   Requested Prescriptions  Pending Prescriptions Disp Refills   tiZANidine (ZANAFLEX) 4 MG capsule [Pharmacy Med Name: TIZANIDINE HCL 4 MG CAPSULE] 60 capsule 1    Sig: Take 1 capsule (4 mg total) by mouth 2 (two) times daily as needed for muscle spasms.      Not Delegated - Cardiovascular:  Alpha-2 Agonists - tizanidine Failed - 07/21/2019 10:44 AM      Failed - This refill cannot be delegated      Passed - Valid encounter within last 6 months    Recent Outpatient Visits           2 months ago Seasonal allergic rhinitis, unspecified trigger   [place], FNP   8 months ago Seasonal allergic rhinitis, unspecified trigger   [place], [place], FNP   12 months ago Annual physical exam   [place], FNP   1 year ago Acute non-recurrent pansinusitis   [place], FNP   1 year ago Gastroesophageal reflux disease without esophagitis   [place], [place], FNP       Future Appointments             In 1 week Hubbard Hartshorn, [facility], [facility]

## 2019-07-23 ENCOUNTER — Ambulatory Visit: Payer: Managed Care, Other (non HMO) | Admitting: Podiatry

## 2019-07-24 ENCOUNTER — Other Ambulatory Visit: Payer: Self-pay | Admitting: Family Medicine

## 2019-07-24 ENCOUNTER — Other Ambulatory Visit: Payer: Self-pay

## 2019-07-24 ENCOUNTER — Telehealth: Payer: Self-pay

## 2019-07-24 DIAGNOSIS — K219 Gastro-esophageal reflux disease without esophagitis: Secondary | ICD-10-CM

## 2019-07-24 NOTE — Telephone Encounter (Signed)
Pt is aware of date/time of covid test for mask fitting.

## 2019-07-26 ENCOUNTER — Other Ambulatory Visit
Admission: RE | Admit: 2019-07-26 | Discharge: 2019-07-26 | Disposition: A | Discharge status: Home / Self Care | Payer: Managed Care, Other (non HMO) | Source: Ambulatory Visit | Attending: Internal Medicine | Admitting: Internal Medicine

## 2019-07-26 DIAGNOSIS — Z20828 Contact with and (suspected) exposure to other viral communicable diseases: Secondary | ICD-10-CM | POA: Insufficient documentation

## 2019-07-26 DIAGNOSIS — Z01812 Encounter for preprocedural laboratory examination: Secondary | ICD-10-CM | POA: Diagnosis present

## 2019-07-26 LAB — SARS CORONAVIRUS 2 (TAT 6-24 HRS): SARS Coronavirus 2: NEGATIVE

## 2019-07-31 ENCOUNTER — Ambulatory Visit (HOSPITAL_BASED_OUTPATIENT_CLINIC_OR_DEPARTMENT_OTHER): Payer: Managed Care, Other (non HMO) | Attending: Internal Medicine | Admitting: Radiology

## 2019-07-31 ENCOUNTER — Other Ambulatory Visit: Payer: Self-pay

## 2019-07-31 DIAGNOSIS — G4733 Obstructive sleep apnea (adult) (pediatric): Secondary | ICD-10-CM

## 2019-08-02 ENCOUNTER — Encounter: Payer: Managed Care, Other (non HMO) | Admitting: Family Medicine

## 2019-08-15 ENCOUNTER — Other Ambulatory Visit: Payer: Self-pay | Admitting: Family Medicine

## 2019-08-15 DIAGNOSIS — M436 Torticollis: Secondary | ICD-10-CM

## 2019-08-15 NOTE — Telephone Encounter (Signed)
Requested medication (s) are due for refill today: yes  Requested medication (s) are on the active medication list: yes  Last refill: 07/23/2019  Future visit scheduled: yes  Notes to clinic:  This refill cannot be delegated    Requested Prescriptions  Pending Prescriptions Disp Refills   tiZANidine (ZANAFLEX) 4 MG capsule [Pharmacy Med Name: TIZANIDINE HCL 4 MG CAPSULE] 60 capsule 1    Sig: TAKE 1 CAPSULE (4 MG TOTAL) BY MOUTH 2 (TWO) TIMES DAILY AS NEEDED FOR MUSCLE SPASMS.      Not Delegated - Cardiovascular:  Alpha-2 Agonists - tizanidine Failed - 08/15/2019  8:30 AM      Failed - This refill cannot be delegated      Passed - Valid encounter within last 6 months    Recent Outpatient Visits           3 months ago Seasonal allergic rhinitis, unspecified trigger   [place], FNP   9 months ago Seasonal allergic rhinitis, unspecified trigger   [place], [place], [place]   1 year ago Annual physical exam   [place], FNP   1 year ago Acute non-recurrent pansinusitis   [place], FNP   1 year ago Gastroesophageal reflux disease without esophagitis   [place], [place], FNP       Future Appointments             In 4 months Hubbard Hartshorn, [facility], [facility]

## 2019-08-29 ENCOUNTER — Other Ambulatory Visit: Payer: Self-pay | Admitting: Family Medicine

## 2019-08-29 DIAGNOSIS — M436 Torticollis: Secondary | ICD-10-CM

## 2019-08-29 NOTE — Telephone Encounter (Signed)
Requested medication (s) are due for refill today: yes  Requested medication (s) are on the active medication list: yes   Future visit scheduled: yes  Notes to clinic:  REQUEST FOR 90 DAYS PRESCRIPTION   Requested Prescriptions  Pending Prescriptions Disp Refills   tiZANidine (ZANAFLEX) 4 MG capsule [Pharmacy Med Name: TIZANIDINE HCL 4 MG CAPSULE] 180 capsule 1    Sig: TAKE 1 CAPSULE (4 MG TOTAL) BY MOUTH 2 (TWO) TIMES DAILY AS NEEDED FOR MUSCLE SPASMS.      Not Delegated - Cardiovascular:  Alpha-2 Agonists - tizanidine Failed - 08/29/2019  8:43 AM      Failed - This refill cannot be delegated      Passed - Valid encounter within last 6 months    Recent Outpatient Visits           3 months ago Seasonal allergic rhinitis, unspecified trigger   [place], FNP   10 months ago Seasonal allergic rhinitis, unspecified trigger   [place], [place], [place]   1 year ago Annual physical exam   [place], FNP   1 year ago Acute non-recurrent pansinusitis   [place], FNP   1 year ago Gastroesophageal reflux disease without esophagitis   [place], [place], FNP       Future Appointments             In 4 months Hubbard Hartshorn, [facility], [facility]

## 2019-10-15 ENCOUNTER — Other Ambulatory Visit: Payer: Self-pay | Admitting: Family Medicine

## 2019-10-15 DIAGNOSIS — K219 Gastro-esophageal reflux disease without esophagitis: Secondary | ICD-10-CM

## 2019-10-30 ENCOUNTER — Other Ambulatory Visit: Payer: Self-pay | Admitting: Family Medicine

## 2019-10-30 DIAGNOSIS — E785 Hyperlipidemia, unspecified: Secondary | ICD-10-CM

## 2019-10-30 DIAGNOSIS — Z8673 Personal history of transient ischemic attack (TIA), and cerebral infarction without residual deficits: Secondary | ICD-10-CM

## 2019-10-30 NOTE — Telephone Encounter (Signed)
Requested Prescriptions  Pending Prescriptions Disp Refills  . aspirin 81 MG EC tablet [Pharmacy Med Name: [facility] ASPIRIN EC 81 MG TABLET] 90 tablet 1    Sig: TAKE 1 TABLET BY MOUTH EVERY DAY     Analgesics:  NSAIDS - aspirin Passed - 10/30/2019  1:28 AM      Passed - Patient is not pregnant      Passed - Valid encounter within last 12 months    Recent Outpatient Visits          6 months ago Seasonal allergic rhinitis, unspecified trigger   [place], FNP   1 year ago Seasonal allergic rhinitis, unspecified trigger   [place], [place], [place]   1 year ago Annual physical exam   [place], FNP   1 year ago Acute non-recurrent pansinusitis   [place], FNP   1 year ago Gastroesophageal reflux disease without esophagitis   [place], [place], FNP      Future Appointments            In 2 months Hubbard Hartshorn, [facility], [place]           . atorvastatin (LIPITOR) 80 MG tablet [Pharmacy Med Name: ATORVASTATIN 80 MG TABLET] 90 tablet 1    Sig: TAKE 1 TABLET BY MOUTH EVERY DAY     Cardiovascular:  Antilipid - Statins Failed - 10/30/2019  1:28 AM      Failed - LDL in normal range and within 360 days    LDL Cholesterol (Calc)  Date Value Ref Range Status  05/03/2019 114 (H) mg/dL (calc) Final    Comment:    Reference range: <100 . Desirable range <100 mg/dL for primary prevention;   <70 mg/dL for patients with CHD or diabetic patients  with > or = 2 CHD risk factors. Marland Kitchen LDL-C is now calculated using the Martin-Hopkins  calculation, which is a validated novel method providing  better accuracy than the Friedewald equation in the  estimation of LDL-C.  Cresenciano Genre et al. Annamaria Helling. [id number]): 2061-2068  (http://education.[facility].com/faq/FAQ164)          Passed - Total Cholesterol in normal  range and within 360 days    Cholesterol  Date Value Ref Range Status  05/03/2019 183 <200 mg/dL Final         Passed - HDL in normal range and within 360 days    HDL  Date Value Ref Range Status  05/03/2019 47 > OR = 40 mg/dL Final         Passed - Triglycerides in normal range and within 360 days    Triglycerides  Date Value Ref Range Status  05/03/2019 109 <150 mg/dL Final         Passed - Patient is not pregnant      Passed - Valid encounter within last 12 months    Recent Outpatient Visits          6 months ago Seasonal allergic rhinitis, unspecified trigger   [place], FNP   1 year ago Seasonal allergic rhinitis, unspecified trigger   [place], [place], [place]   1 year ago Annual physical exam   [place], FNP   1 year ago Acute non-recurrent pansinusitis   [place],  [place]str[place], FNP   1 year ago Gastroesophageal reflux disease without esophagitis   [place], [place], FNP      Future Appointments            In 2 months Hubbard Hartshorn, [facility], [facility]

## 2019-11-26 ENCOUNTER — Encounter: Payer: Self-pay | Admitting: Family Medicine

## 2020-01-01 ENCOUNTER — Encounter: Payer: Managed Care, Other (non HMO) | Admitting: Family Medicine

## 2020-01-02 ENCOUNTER — Ambulatory Visit: Payer: Managed Care, Other (non HMO) | Admitting: Internal Medicine

## 2020-01-08 NOTE — Progress Notes (Deleted)
Patient is a 64 year old male patient of [name] Last visit with [name] was in October 2020 Follows up today.  Allergic rhinitis: Managed with a nonsedating antihistamine and flonase in the spring.    Chronic neck Pain:He did attend PT for neck pain, but it was very expensive and only able to go to 3 sessions.  Was given exercises to do at home and he has been doing them during his night shift hours at work.  Tizanidine is really helping him.  Overweight: Wt Readings from Last 3 Encounters:  05/03/19 237 lb 4.8 oz (107.6 kg)  10/27/18 231 lb (104.8 kg)  10/26/18 230 lb 8 oz (104.6 kg)   Exercise - He is walking 1.5 miles daily;  Does not eat out frequently, tries to eat a healthy diet   GERD:  Medication regimen-omeprazole or famotadine PRN (once every 2-3 weeks).  Discussed risk of long-term PPI use.  No black or dark stools, bleeding per rectum, vomiting, problems swallowing, chronic abdominal pain  Has been fairly stable in the recent past.   OSA: In December 2020, after seeing pulmonary, had a re-evaluation with a sleep study and mask fitting procedure to be able to use his CPAP again.  Smoking: Still smokes cigars on the weekends  Hx TIA: He was referred to neurologist in the past but didn't go. Has not had any more episodes.  He is taking lipitor, taking ASA 81mg .   Hyperlipidemia: Medication regimen-lipitor 40mg  (has history of myalgias with other statins),  Lab Results  Component Value Date   CHOL 183 05/03/2019   HDL 47 05/03/2019   LDLCALC 114 (H) 05/03/2019   TRIG 109 05/03/2019   CHOLHDL 3.9 05/03/2019   Denies increased myalgias  Morton's Neuroma: RIGHT pad of foot, has seen podiatry in the past, last visit in November 2020.  Colon cancer screening Denies family history of colon cancer Cologuard test in April 2018 was negative. No recent black/dark stools, bleeding per rectum, chronic abdominal pains  Prostate cancer  screening Denies family history of prostate cancer  Recent PSAs have been okay. Denies any increased frequency, urgency, hesitancy, hematuria. Lab Results  Component Value Date   PSA 1.3 07/24/2018   PSA 2.3 11/16/2016

## 2020-01-09 ENCOUNTER — Encounter: Payer: Managed Care, Other (non HMO) | Admitting: Internal Medicine

## 2020-01-30 NOTE — Progress Notes (Signed)
Patient ID: Curtis Underwood, male    DOB: [birth year], 64 y.o.   MRN: [id number]  PCP: Curtis Malkin, MD  Chief Complaint  Patient presents with  . Follow-up    patient is here to establish care with Dr. Roxan Underwood    Subjective:   Curtis Underwood is a 64 y.o. male, presents to clinic with CC of the following:  Chief Complaint  Patient presents with  . Follow-up    patient is here to establish care with Dr. Roxan Underwood    HPI:  Patient is a 64 year old male patient of Curtis Underwood Last visit with Curtis Underwood was 05/03/2019. Follows up today. Notes is feeling fine, no specific complaints  Allergic rhinitis: Using nonsedating antihistamine, astelin,  and flonase in the spring/summer due to [place]. Meds helpful.   Neck Pain:He did attend PT in the past for neck pain, but it was very expensive and only able to go to 3 sessions.  Was given exercises to do at home and he has been doing them during his night shift hours at work.  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  Notes uses one about every 3 weeks.  Obesity: Wt Readings from Last 3 Encounters:  01/31/20 231 lb 1.6 oz (104.8 kg)  05/03/19 237 lb 4.8 oz (107.6 kg)  10/27/18 231 lb (104.8 kg)  Weight has remained very stable over the past year plus, down some from last weight in October Exercise  - He is trying to do more walking, at least 30 minutes when he is able to; now only about once a week as very busy.  Encouraged to do more frequently Diet - Does not eat out frequently, still fast food a bit, tries to eat a healthy diet.   GERD:  taking omeprazole PRN (not daily, and using last few days now.  Notes when he gets symptoms, takes for a few days before trying off again).  Discussed risk of long-term PPI use.  Also the importance of reflux precautions noted Denies any dark or black stools, chronic abdominal pains,  problems swallowing or vomiting. Has been stable in the recent past with intermittent use of the PPI.  OSA:  Using CPAP, an old machine, did a fitting for a new mask end of Dec.   Working with insurance to get a new sleep study in next couple months and working with [facility] to get.  Hopes to then get a new machine. States the CPAP is helpful and is sleeping well  Tobacco use: Smokes cigars occasionally.  Hx TIA: He was referred to neurologist in the past but didn't go Has not had any more episodes.  He is taking lipitor, taking ASA 81mg .   Hyperlipidemia:  Taking lipitor 40mg  (has history of myalgias with other statins), Zetia - not taking every day, still takes on occasions as concern with bothering stomach as the reason not take daily. Lab Results  Component Value Date   CHOL 183 05/03/2019   HDL 47 05/03/2019   LDLCALC 114 (H) 05/03/2019   TRIG 109 05/03/2019   CHOLHDL 3.9 05/03/2019   no myalgias .  Denies any chest pains, palpitations, shortness of breath, increased lower extremity swelling.  No increased headaches or vision changes, and no history of high blood pressure in his past.  Morton's Neuroma: Saw podiatry in the past for management and no recent problems  Colorectal cancer screening: Cologuard negative 2018. Had colonoscopy about 10 years ago and ok. Desires to continue the cologuard presently versus a repeat colonoscopy.  Did note if the Cologuard is positive, will need to proceed with a colonoscopy Denies family or personal history of colorectal cancer,  no changes in BM's  Denies dark or black stools, bleeding per rectum, chronic abdominal pains, or change in bowel habits  Prostate cancer screening  Family history negative  Denies increased symptoms of hesitancy, frequency, urgency, not up at all overnight to urinate Lab Results  Component Value Date   PSA 1.3 07/24/2018   PSA 2.3 11/16/2016   Currently works for [employer], does weekend shifts  of 12+ hours which is often exhausting.  Patient Active Problem List   Diagnosis Date Noted  . Lesion of lip 05/03/2019  . Overweight (BMI 25.0-29.9) 07/24/2018  . History of transient ischemic attack (TIA) 04/14/2018  . Gastroesophageal reflux disease without esophagitis 05/26/2015  . Seasonal allergic rhinitis 05/26/2015  . Dyslipidemia 05/26/2015  . Smokes cigars 05/26/2015  . Obstructive sleep apnea 05/26/2015  . PND (post-nasal drip) 05/26/2015  . Neck stiffness 05/26/2015      Current Outpatient Medications:  .  aspirin 81 MG EC tablet, TAKE 1 TABLET BY MOUTH EVERY DAY, Disp: 90 tablet, Rfl: 1 .  atorvastatin (LIPITOR) 80 MG tablet, TAKE 1 TABLET BY MOUTH EVERY DAY, Disp: 90 tablet, Rfl: 1 .  azelastine (ASTELIN) 0.1 % nasal spray, Place 2 sprays into both nostrils 2 (two) times daily. Use in each nostril as directed, Disp: 30 mL, Rfl: 12 .  cetirizine (ZYRTEC) 10 MG tablet, Take 10 mg by mouth as needed for allergies., Disp: , Rfl:  .  ezetimibe (ZETIA) 10 MG tablet, Take 1 tablet (10 mg total) by mouth daily., Disp: 90 tablet, Rfl: 3 .  fluticasone (FLONASE) 50 MCG/ACT nasal spray, PLACE 2 SPRAYS INTO EACH NOSTRIL EVERY DAY, Disp: 48 g, Rfl: 6 .  omeprazole (PRILOSEC) 20 MG capsule, TAKE 1 CAPSULE BY MOUTH EVERY DAY, Disp: 90 capsule, Rfl: 1 .  tiZANidine (ZANAFLEX) 4 MG tablet, Take 4 mg by mouth every 6 (six) hours as needed for muscle spasms. As needed for muscle spasms, Disp: , Rfl:    No Known Allergies   Past Surgical History:  Procedure Laterality Date  . APPENDECTOMY       Family History  Adopted: Yes  Family history unknown: Yes     Social History   Tobacco Use  . Smoking status: Current Some Day Smoker    Types: Cigars  . Smokeless tobacco: Never Used  Substance Use Topics  . Alcohol use: Yes    Alcohol/week: 0.0 standard drinks    Comment: occasionally    With staff assistance, above reviewed with the patient today.  ROS: As per HPI, noted  an occasional tingling in his left forearm intermittently, with no weakness, not dropping things, and denies any numbness or tingling in the lower extremities, otherwise no specific complaints on a limited and focused system review   No results found for this or any previous visit (from the past 72 hour(s)).   PHQ2/9: Depression screen [facility] 2/9 01/31/2020 05/03/2019 10/26/2018 07/24/2018 06/23/2018  Decreased Interest 0 0 0 0 0  Down, Depressed, Hopeless 0 0 0 0 0  PHQ - 2 Score 0 0 0 0 0  Altered sleeping 0 0 0 0 0  Tired, decreased energy 0 0 0 0 0  Change in appetite 0 0 0 0  0  Feeling bad or failure about yourself  0 0 0 0 0  Trouble concentrating 0 0 0 0 0  Moving slowly or fidgety/restless 0 0 0 0 0  Suicidal thoughts 0 0 0 0 0  PHQ-9 Score 0 0 0 0 0  Difficult doing work/chores Not difficult at all Not difficult at all Not difficult at all Not difficult at all Not difficult at all   PHQ-2/9 Result is neg  Fall Risk: Fall Risk  01/31/2020 05/03/2019 10/26/2018 07/24/2018 06/23/2018  Falls in the past year? 0 0 0 0 0  Number falls in past yr: 0 0 0 - 0  Injury with Fall? 0 0 0 - 0  Follow up - - Falls evaluation completed - -      Objective:   Vitals:   01/31/20 0903  BP: 134/84  Pulse: 69  Resp: 16  Temp: 97.9 F (36.6 C)  TempSrc: Temporal  SpO2: 99%  Weight: 231 lb 1.6 oz (104.8 kg)  Height: 6\' 2"  (1.88 m)    Body mass index is 29.67 kg/m.  Physical Exam   NAD, masked, very pleasant HEENT - [place]/AT, sclera anicteric, positive glasses, PERRL, EOMI, conj - non-inj'ed, pharynx clear Neck - supple, no adenopathy, no TM, carotids 2+ and = without bruits bilat Car - RRR without m/g/r Pulm- RR and effort normal at rest, CTA without wheeze or rales Abd - soft, NT, ND, BS+,  no masses Back - no CVA tenderness Skin- no rash noted, no new lesions of concern noted on exposed areas, patient denied otherwise Ext - no LE edema, a pronounced tibial plateau on the right lower  extremity was noted, not tender to palpate today, (denied any history of [place] growing up, notes it can occasionally get more uncomfortable, especially if kneeling).  Good strength and range of motion of the right knee. GU/rectal -deferred with no concerning symptoms noted and prostate exams no longer recommended for cancer screening purposes.  We will continue to follow PSAs to help with that screening after discussion Neuro/psychiatric - affect was not flat, appropriate with conversation  Alert and oriented  Grossly non-focal - good strength on testing extremities, sensation intact to LT in distal extremities, DTRs 1+ and equal in the patella, Romberg negative, no pronator drift, good finger-to-nose, gait was normal with good tandem walk, good balance on 1 foot  Speech normal   Results for orders placed or performed during the hospital encounter of 07/26/19  SARS CORONAVIRUS 2 (TAT 6-24 HRS) Nasopharyngeal Nasopharyngeal Swab   Specimen: Nasopharyngeal Swab  Result Value Ref Range   SARS Coronavirus 2 NEGATIVE NEGATIVE   Last labs reviewed.    Assessment & Plan:   1. Colon cancer screening Patient prefers to continue with Cologuard screening and ordered.  Last 1 in 2018 neg. He did have a prior colonoscopy about 10 years ago noted. Aware if Cologuard is positive, the need to proceed with another colonoscopy. - Cologuard  2. Encounter to establish care with new doctor   3. Vitamin D deficiency Has a history of vitamin D deficiency, and noted at 1 point did take a increased supplement dose, but has not been on any maintenance vitamin D supplementation in the recent past. Felt best to recheck a vitamin D level with his labs. Last vitamin D Lab Results  Component Value Date   VD25OH 19 (L) 07/24/2018   - VITAMIN D 25 Hydroxy (Vit-D Deficiency, Fractures)  4. Dyslipidemia Last lipid panel reviewed, mild increase  in LDL persists. Importance of dietary modifications and  increased physical activity noted to help, and also continue the statin product presently. Has been taking the Zetia only very intermittently, Will recheck lipid panel today.  Has been fasting. - COMPLETE METABOLIC PANEL WITH GFR - Lipid panel  5. Gastroesophageal reflux disease without esophagitis Takes the omeprazole intermittently, not daily. Discussed concerns with long-term chronic use of PPIs that is emerging, and do feel continuing to take as he is doing is appropriate. Reflux precautions importance also noted. - COMPLETE METABOLIC PANEL WITH GFR - CBC with Differential/Platelet  6. Screening for prostate cancer Discussed the role of PSAs and prostate cancer screening, and will continue to utilize to help in that role. Have been good previously, recheck with current labs - PSA  7. Overweight (BMI 25.0-29.9) Weight has been relatively stable in the recent past. The importance of healthy weight maintenance emphasized today, with increasing physical activity levels strongly encouraged, trying to increase his walking activities back to near daily encouraged, and also continue with dietary modifications to help as well.  He noted he does not like to eat before going to bed, although with his weekend work schedule, is very challenging. Continue to monitor. - COMPLETE METABOLIC PANEL WITH GFR  8. Seasonal allergic rhinitis, unspecified trigger Doing well on his regimen for seasonal rhinitis, which is usually more problematic in the spring and summer months.  To continue.  9. Neck pain Discussed increasing physical activity can help with some of his neck symptoms which seem to be more muscle based. Continue with periodic exercises that were recommended with physical therapy's input previously, and can continue the as needed muscle relaxant at nighttime which is helpful as he is using it very sparingly.  10. Smokes cigars Encouraged smoking cessation, although he did note he enjoys  cigars occasionally.  11. History of transient ischemic attack (TIA) Has had no current episodes of concern. He remains on a baby aspirin daily and a statin.  12. Obstructive sleep apnea Agree with proceeding with another sleep study to help get updated equipment first his CPAP, as he notes it is quite helpful. He has no bowel or pulmonary involved and is working towards having the sleep study done in the next couple months, as he is aware he needs another sleep study before he can get updated equipment.   He will follow up again in approximately 6 months time, sooner as needed, and await lab results presently.     Curtis Malkin, MD 01/31/20 9:28 AM

## 2020-01-31 ENCOUNTER — Encounter: Payer: Self-pay | Admitting: Internal Medicine

## 2020-01-31 ENCOUNTER — Ambulatory Visit (INDEPENDENT_AMBULATORY_CARE_PROVIDER_SITE_OTHER): Payer: Managed Care, Other (non HMO) | Admitting: Internal Medicine

## 2020-01-31 ENCOUNTER — Other Ambulatory Visit: Payer: Self-pay

## 2020-01-31 VITALS — BP 134/84 | HR 69 | Temp 97.9°F | Resp 16 | Ht 74.0 in | Wt 231.1 lb

## 2020-01-31 DIAGNOSIS — F17290 Nicotine dependence, other tobacco product, uncomplicated: Secondary | ICD-10-CM

## 2020-01-31 DIAGNOSIS — G4733 Obstructive sleep apnea (adult) (pediatric): Secondary | ICD-10-CM

## 2020-01-31 DIAGNOSIS — K219 Gastro-esophageal reflux disease without esophagitis: Secondary | ICD-10-CM

## 2020-01-31 DIAGNOSIS — E785 Hyperlipidemia, unspecified: Secondary | ICD-10-CM

## 2020-01-31 DIAGNOSIS — E559 Vitamin D deficiency, unspecified: Secondary | ICD-10-CM | POA: Diagnosis not present

## 2020-01-31 DIAGNOSIS — Z1211 Encounter for screening for malignant neoplasm of colon: Secondary | ICD-10-CM

## 2020-01-31 DIAGNOSIS — M542 Cervicalgia: Secondary | ICD-10-CM

## 2020-01-31 DIAGNOSIS — Z8673 Personal history of transient ischemic attack (TIA), and cerebral infarction without residual deficits: Secondary | ICD-10-CM

## 2020-01-31 DIAGNOSIS — Z7689 Persons encountering health services in other specified circumstances: Secondary | ICD-10-CM

## 2020-01-31 DIAGNOSIS — J302 Other seasonal allergic rhinitis: Secondary | ICD-10-CM

## 2020-01-31 DIAGNOSIS — Z125 Encounter for screening for malignant neoplasm of prostate: Secondary | ICD-10-CM

## 2020-01-31 DIAGNOSIS — E663 Overweight: Secondary | ICD-10-CM

## 2020-01-31 NOTE — Patient Instructions (Signed)

## 2020-02-01 ENCOUNTER — Telehealth: Payer: Self-pay

## 2020-02-01 ENCOUNTER — Telehealth: Payer: Self-pay | Admitting: Internal Medicine

## 2020-02-01 LAB — CBC WITH DIFFERENTIAL/PLATELET
Absolute Monocytes: 590 cells/uL (ref 200–950)
Basophils Absolute: 50 cells/uL (ref 0–200)
Basophils Relative: 0.7 %
Eosinophils Absolute: 79 cells/uL (ref 15–500)
Eosinophils Relative: 1.1 %
HCT: 47.1 % (ref 38.5–50.0)
Hemoglobin: 15.8 g/dL (ref 13.2–17.1)
Lymphs Abs: 1642 cells/uL (ref 850–3900)
MCH: 30.7 pg (ref 27.0–33.0)
MCHC: 33.5 g/dL (ref 32.0–36.0)
MCV: 91.5 fL (ref 80.0–100.0)
MPV: 12.5 fL (ref 7.5–12.5)
Monocytes Relative: 8.2 %
Neutro Abs: 4838 cells/uL (ref 1500–7800)
Neutrophils Relative %: 67.2 %
Platelets: 210 10*3/uL (ref 140–400)
RBC: 5.15 10*6/uL (ref 4.20–5.80)
RDW: 12.3 % (ref 11.0–15.0)
Total Lymphocyte: 22.8 %
WBC: 7.2 10*3/uL (ref 3.8–10.8)

## 2020-02-01 LAB — COMPLETE METABOLIC PANEL WITH GFR
AG Ratio: 2.1 (calc) (ref 1.0–2.5)
ALT: 28 U/L (ref 9–46)
AST: 18 U/L (ref 10–35)
Albumin: 4.6 g/dL (ref 3.6–5.1)
Alkaline phosphatase (APISO): 99 U/L (ref 35–144)
BUN: 13 mg/dL (ref 7–25)
CO2: 30 mmol/L (ref 20–32)
Calcium: 9.4 mg/dL (ref 8.6–10.3)
Chloride: 103 mmol/L (ref 98–110)
Creat: 1.12 mg/dL (ref 0.70–1.25)
GFR, Est African American: 81 mL/min/{1.73_m2} (ref >=60)
GFR, Est Non African American: 70 mL/min/{1.73_m2} (ref >=60)
Globulin: 2.2 g/dL (calc) (ref 1.9–3.7)
Glucose, Bld: 101 mg/dL — ABNORMAL HIGH (ref 65–99)
Potassium: 4.8 mmol/L (ref 3.5–5.3)
Sodium: 138 mmol/L (ref 135–146)
Total Bilirubin: 0.6 mg/dL (ref 0.2–1.2)
Total Protein: 6.8 g/dL (ref 6.1–8.1)

## 2020-02-01 LAB — VITAMIN D 25 HYDROXY (VIT D DEFICIENCY, FRACTURES): Vit D, 25-Hydroxy: 27 ng/mL — ABNORMAL LOW (ref 30–100)

## 2020-02-01 LAB — LIPID PANEL
Cholesterol: 170 mg/dL (ref <200)
HDL: 49 mg/dL (ref >=40)
LDL Cholesterol (Calc): 105 mg/dL (calc) — ABNORMAL HIGH
Non-HDL Cholesterol (Calc): 121 mg/dL (calc) (ref <130)
Total CHOL/HDL Ratio: 3.5 (calc) (ref <5.0)
Triglycerides: 75 mg/dL (ref <150)

## 2020-02-01 LAB — PSA: PSA: 1.2 ng/mL (ref <=4.0)

## 2020-02-01 NOTE — Telephone Encounter (Signed)
Lm for pt

## 2020-02-01 NOTE — Telephone Encounter (Signed)
I reviewed labs with patient he called back

## 2020-02-05 NOTE — Telephone Encounter (Signed)
Lm x2 for pt.  

## 2020-02-05 NOTE — Telephone Encounter (Signed)
Lm x3 for pt.

## 2020-02-06 NOTE — Telephone Encounter (Signed)
Lm x4 on both mobile and home number on file.  Will close encounter per office protocol.  Letter has been mailed to address on file.

## 2020-02-22 ENCOUNTER — Telehealth: Payer: Self-pay

## 2020-02-22 ENCOUNTER — Ambulatory Visit: Payer: Managed Care, Other (non HMO) | Admitting: Pulmonary Disease

## 2020-02-22 LAB — COLOGUARD
COLOGUARD: NEGATIVE
Cologuard: NEGATIVE

## 2020-02-22 NOTE — Telephone Encounter (Signed)
Called patient to inform him that his Cologuard screening was negative. No answer, left vm for him to return my call.

## 2020-05-08 ENCOUNTER — Encounter: Payer: Self-pay | Admitting: Internal Medicine

## 2020-05-08 ENCOUNTER — Other Ambulatory Visit: Payer: Self-pay

## 2020-05-08 ENCOUNTER — Ambulatory Visit (INDEPENDENT_AMBULATORY_CARE_PROVIDER_SITE_OTHER): Payer: Managed Care, Other (non HMO) | Admitting: Internal Medicine

## 2020-05-08 VITALS — BP 150/90 | HR 74 | Temp 98.1°F | Resp 16 | Ht 74.0 in | Wt 234.9 lb

## 2020-05-08 DIAGNOSIS — Z23 Encounter for immunization: Secondary | ICD-10-CM | POA: Diagnosis not present

## 2020-05-08 DIAGNOSIS — M549 Dorsalgia, unspecified: Secondary | ICD-10-CM

## 2020-05-08 DIAGNOSIS — E663 Overweight: Secondary | ICD-10-CM | POA: Diagnosis not present

## 2020-05-08 DIAGNOSIS — R030 Elevated blood-pressure reading, without diagnosis of hypertension: Secondary | ICD-10-CM

## 2020-05-08 DIAGNOSIS — R109 Unspecified abdominal pain: Secondary | ICD-10-CM | POA: Diagnosis not present

## 2020-05-08 LAB — POCT URINALYSIS DIPSTICK
Appearance: NORMAL
Bilirubin, UA: NEGATIVE
Blood, UA: NEGATIVE
Glucose, UA: NEGATIVE
Ketones, UA: NEGATIVE
Leukocytes, UA: NEGATIVE
Nitrite, UA: NEGATIVE
Odor: NORMAL
Protein, UA: NEGATIVE
Spec Grav, UA: 1.015 (ref 1.010–1.025)
Urobilinogen, UA: 0.2 E.U./dL
pH, UA: 5 (ref 5.0–8.0)

## 2020-05-08 NOTE — Patient Instructions (Signed)
You will be contacted to help get the ultrasound scheduled that was ordered today  Can use arthritis strength Tylenol as needed in the short-term  Also can use topical measures including warm compresses, followed by range of motion exercises, followed by cold as we discussed

## 2020-05-08 NOTE — Progress Notes (Signed)
Patient ID: Curtis Underwood, male    DOB: Apr 21, 1956, 64 y.o.   MRN: [id number]  PCP: Towanda Malkin, MD  Chief Complaint  Patient presents with  . Back Pain    Subjective:   Curtis Underwood is a 64 y.o. male, presents to clinic with CC of the following:  Chief Complaint  Patient presents with  . Back Pain    HPI:  Patient is a 64 year old male Last visit with me was in July 2021 Follows up today with back pain complaints. Here with his wife.  At that visit it was noted he had a history of neck pain and obesity as follows: Neck Pain:He did attend PT in the past for neck pain, but it was very expensive and only able to go to 3 sessions. Was given exercises to do at home and he has been doing them during his night shift hours at work. 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  Notes uses one about every 3 weeks. Obesity: Wt Readings from Last 3 Encounters:  05/08/20 234 lb 14.4 oz (106.5 kg)  01/31/20 231 lb 1.6 oz (104.8 kg)  05/03/19 237 lb 4.8 oz (107.6 kg)   Weight has remained very stable over the past year plus, down some from last weight in October  Exercise  - He is trying to do more walking, at least 30 minutes when he is able to; now only about once a week as very busy.  Encouraged to do more frequently Diet - Does not eat out frequently, still fast food a bit, tries to eat a healthy diet.  He also has a history of GERD and takes omeprazole as needed (takes for a few days when he starts to the symptoms and then tries off of the medicine again)  Today he notes back pain symptoms started about 3 weeks ago, no trauma prior, and states is more of a soreness, low-grade, feels like a bruise, and feels it in the right flank region.  It is bothersome when he rotates and turns, also when he lays down at night often.  Not limiting during the day, and is still walking, 2  times a week about 30 minutes, and not painful with walking.  He has not tried anything topically to help, as not that problematic.  He has not tried any of the muscle relaxer he has to use for neck pain.  Denies any urinary symptoms including no blood in the urine, no burning with urination, no frequency.  He has no history of kidney stones.  Denies any abdominal pains.  Also no shortness of breath or chest pains.    Patient Active Problem List   Diagnosis Date Noted  . Vitamin D deficiency 01/31/2020  . Lesion of lip 05/03/2019  . Overweight (BMI 25.0-29.9) 07/24/2018  . History of transient ischemic attack (TIA) 04/14/2018  . Gastroesophageal reflux disease without esophagitis 05/26/2015  . Seasonal allergic rhinitis 05/26/2015  . Neck pain 05/26/2015  . Dyslipidemia 05/26/2015  . Smokes cigars 05/26/2015  . Obstructive sleep apnea 05/26/2015  . PND (post-nasal drip) 05/26/2015      Current Outpatient Medications:  .  aspirin 81 MG EC tablet, TAKE 1 TABLET BY MOUTH EVERY DAY, Disp: 90 tablet, Rfl: 1 .  atorvastatin (LIPITOR) 80 MG tablet, TAKE 1 TABLET BY MOUTH EVERY DAY, Disp: 90 tablet, Rfl: 1 .  azelastine (ASTELIN)  0.1 % nasal spray, Place 2 sprays into both nostrils 2 (two) times daily. Use in each nostril as directed, Disp: 30 mL, Rfl: 12 .  cetirizine (ZYRTEC) 10 MG tablet, Take 10 mg by mouth as needed for allergies., Disp: , Rfl:  .  ezetimibe (ZETIA) 10 MG tablet, Take 1 tablet (10 mg total) by mouth daily., Disp: 90 tablet, Rfl: 3 .  fluticasone (FLONASE) 50 MCG/ACT nasal spray, PLACE 2 SPRAYS INTO EACH NOSTRIL EVERY DAY, Disp: 48 g, Rfl: 6 .  omeprazole (PRILOSEC) 20 MG capsule, TAKE 1 CAPSULE BY MOUTH EVERY DAY, Disp: 90 capsule, Rfl: 1 .  tiZANidine (ZANAFLEX) 4 MG tablet, Take 4 mg by mouth every 6 (six) hours as needed for muscle spasms. As needed for muscle spasms, Disp: , Rfl:    No Known Allergies   Past Surgical History:  Procedure Laterality Date  .  APPENDECTOMY       Family History  Adopted: Yes  Family history unknown: Yes     Social History   Tobacco Use  . Smoking status: Current Some Day Smoker    Types: Cigars  . Smokeless tobacco: Never Used  Substance Use Topics  . Alcohol use: Yes    Alcohol/week: 0.0 standard drinks    Comment: occasionally    With staff assistance, above reviewed with the patient today.  ROS: As per HPI, otherwise no specific complaints on a limited and focused system review   No results found for this or any previous visit (from the past 72 hour(s)).   PHQ2/9: Depression screen [facility] 2/9 05/08/2020 01/31/2020 05/03/2019 10/26/2018 07/24/2018  Decreased Interest 0 0 0 0 0  Down, Depressed, Hopeless 0 0 0 0 0  PHQ - 2 Score 0 0 0 0 0  Altered sleeping - 0 0 0 0  Tired, decreased energy - 0 0 0 0  Change in appetite - 0 0 0 0  Feeling bad or failure about yourself  - 0 0 0 0  Trouble concentrating - 0 0 0 0  Moving slowly or fidgety/restless - 0 0 0 0  Suicidal thoughts - 0 0 0 0  PHQ-9 Score - 0 0 0 0  Difficult doing work/chores - Not difficult at all Not difficult at all Not difficult at all Not difficult at all   PHQ-2/9 Result is neg  Fall Risk: Fall Risk  05/08/2020 01/31/2020 05/03/2019 10/26/2018 07/24/2018  Falls in the past year? 0 0 0 0 0  Number falls in past yr: 0 0 0 0 -  Injury with Fall? 0 0 0 0 -  Follow up - - - Falls evaluation completed -      Objective:   Vitals:   05/08/20 0908  BP: (!) 150/90  Pulse: 74  Resp: 16  Temp: 98.1 F (36.7 C)  TempSrc: Oral  SpO2: 99%  Weight: 234 lb 14.4 oz (106.5 kg)  Height: 6\' 2"  (1.88 m)    Body mass index is 30.16 kg/m.  Physical Exam   NAD, masked, pleasant, looks well HEENT - [place]/AT, sclera anicteric, PERRL, conj - non-inj'ed, no focal sinus tenderness, pharynx clear Neck - supple, no adenopathy, no rigidity Car - RRR without m/g/r Pulm- RR and effort normal at rest, CTA without wheeze or rales Abd - soft, obese,  NT, ND,  no obvious masses Back -nontender with palpation over the lumbosacral spine, nontender in the infrascapular region, tender in the right CVA region, nontender left CVA region, nontender inferior to the  CVA region in the lower back muscle area.  No erythema or bruising noted, Straight leg raise was negative, with only minimal discomfort in the back area with straight leg raise right greater than left, no marked discomfort bringing his right knee to his chest when supine. Noted some mild discomfort with turning while seated, Skin- no rash noted on exposed areas, Ext - no LE edema,  Neuro/psychiatric - affect was not flat, appropriate with conversation  Alert and oriented  Grossly non-focal - good strength on testing extremities, including with dorsi and plantarflexion of the feet, sensation intact to LT in distal extremities  Speech normal   Results for orders placed or performed in visit on 01/31/20  Cologuard  Result Value Ref Range   Cologuard Negative Negative  COMPLETE METABOLIC PANEL WITH GFR  Result Value Ref Range   Glucose, Bld 101 (H) 65 - 99 mg/dL   BUN 13 7 - 25 mg/dL   Creat 1.12 0.70 - 1.25 mg/dL   GFR, Est Non African American 70 > OR = 60 mL/min/1.37m2   GFR, Est African American 81 > OR = 60 mL/min/1.25m2   BUN/Creatinine Ratio NOT APPLICABLE 6 - 22 (calc)   Sodium 138 135 - 146 mmol/L   Potassium 4.8 3.5 - 5.3 mmol/L   Chloride 103 98 - 110 mmol/L   CO2 30 20 - 32 mmol/L   Calcium 9.4 8.6 - 10.3 mg/dL   Total Protein 6.8 6.1 - 8.1 g/dL   Albumin 4.6 3.6 - 5.1 g/dL   Globulin 2.2 1.9 - 3.7 g/dL (calc)   AG Ratio 2.1 1.0 - 2.5 (calc)   Total Bilirubin 0.6 0.2 - 1.2 mg/dL   Alkaline phosphatase (APISO) 99 35 - 144 U/L   AST 18 10 - 35 U/L   ALT 28 9 - 46 U/L  CBC with Differential/Platelet  Result Value Ref Range   WBC 7.2 3.8 - 10.8 Thousand/uL   RBC 5.15 4.20 - 5.80 Million/uL   Hemoglobin 15.8 13.2 - 17.1 g/dL   HCT 47.1 38 - 50 %   MCV 91.5 80.0 -  100.0 fL   MCH 30.7 27.0 - 33.0 pg   MCHC 33.5 32.0 - 36.0 g/dL   RDW 12.3 11.0 - 15.0 %   Platelets 210 140 - 400 Thousand/uL   MPV 12.5 7.5 - 12.5 fL   Neutro Abs 4,838 1,500 - 7,800 cells/uL   Lymphs Abs 1,642 850 - 3,900 cells/uL   Absolute Monocytes 590 200 - 950 cells/uL   Eosinophils Absolute 79 15 - 500 cells/uL   Basophils Absolute 50 0 - 200 cells/uL   Neutrophils Relative % 67.2 %   Total Lymphocyte 22.8 %   Monocytes Relative 8.2 %   Eosinophils Relative 1.1 %   Basophils Relative 0.7 %  Lipid panel  Result Value Ref Range   Cholesterol 170 <200 mg/dL   HDL 49 > OR = 40 mg/dL   Triglycerides 75 <150 mg/dL   LDL Cholesterol (Calc) 105 (H) mg/dL (calc)   Total CHOL/HDL Ratio 3.5 <5.0 (calc)   Non-HDL Cholesterol (Calc) 121 <130 mg/dL (calc)  PSA  Result Value Ref Range   PSA 1.2 < OR = 4.0 ng/mL  VITAMIN D 25 Hydroxy (Vit-D Deficiency, Fractures)  Result Value Ref Range   Vit D, 25-Hydroxy 27 (L) 30 - 100 ng/mL   Urine dip in the office today was negative    Assessment & Plan:  1. Need for immunization against influenza - Flu  Vaccine QUAD 36+ mos IM  2. Flank pain/3. Other acute back pain Exact source unclear presently, although did note the potential for a mechanical source, with likely an oblique strain concern, it is uncomfortable with turning, and low-grade by description like a muscular discomfort.  Did note concerns with it at the CVA region on the right and the potential for kidney concerns.  He denies any concerning urinary symptoms in the recent past. Felt best to check a urine dip which was negative We will check a basic metabolic panel today as well and a CBC. Also will check an ultrasound of the kidney to ensure that there are no concerns.  It is a noninvasive test Recommended local measures including warm compresses, followed by range of motion exercises sizes, followed by cold after. Can use arthritis strength Tylenol as needed, and would avoid  ibuprofen or Aleve type products presently until we ensure the kidney tests are okay.  Also, he has a history of reflux and these can exacerbate reflux. Also can use the tizanidine product at bedtime to help overnight with some discomfort.  POCT urinalysis dipstick - CBC with Differential/Platelet - BASIC METABOLIC PANEL WITH GFR - US Renal; Future   4. Overweight (BMI 25.0-29.9)/borderline obesity Can contribute to back pains.  5. Elevated BP without diagnosis of hypertension Noted his blood pressure was borderline high today, likely can be from his symptoms above.  Can see his pain can increase blood pressure) Felt best to have a follow-up to ensure symptoms are better and blood pressure is remaining controlled - BASIC METABOLIC PANEL WITH GFR   Await the work-up results, and his response to the above, and schedule a follow-up for 4 weeks time, should follow-up sooner as needed, especially if symptoms are more problematic or not improving. Okay to give the flu shot today.   Towanda Malkin, MD 05/08/20 9:28 AM

## 2020-05-09 LAB — BASIC METABOLIC PANEL WITH GFR
BUN: 12 mg/dL (ref 7–25)
CO2: 23 mmol/L (ref 20–32)
Calcium: 9.3 mg/dL (ref 8.6–10.3)
Chloride: 105 mmol/L (ref 98–110)
Creat: 1.13 mg/dL (ref 0.70–1.25)
GFR, Est African American: 80 mL/min/{1.73_m2} (ref >=60)
GFR, Est Non African American: 69 mL/min/{1.73_m2} (ref >=60)
Glucose, Bld: 88 mg/dL (ref 65–99)
Potassium: 4.5 mmol/L (ref 3.5–5.3)
Sodium: 141 mmol/L (ref 135–146)

## 2020-05-09 LAB — CBC WITH DIFFERENTIAL/PLATELET
Absolute Monocytes: 637 cells/uL (ref 200–950)
Basophils Absolute: 77 cells/uL (ref 0–200)
Basophils Relative: 1.1 %
Eosinophils Absolute: 140 cells/uL (ref 15–500)
Eosinophils Relative: 2 %
HCT: 47.7 % (ref 38.5–50.0)
Hemoglobin: 16 g/dL (ref 13.2–17.1)
Lymphs Abs: 1491 cells/uL (ref 850–3900)
MCH: 30.5 pg (ref 27.0–33.0)
MCHC: 33.5 g/dL (ref 32.0–36.0)
MCV: 91 fL (ref 80.0–100.0)
MPV: 12.8 fL — ABNORMAL HIGH (ref 7.5–12.5)
Monocytes Relative: 9.1 %
Neutro Abs: 4655 cells/uL (ref 1500–7800)
Neutrophils Relative %: 66.5 %
Platelets: 221 10*3/uL (ref 140–400)
RBC: 5.24 10*6/uL (ref 4.20–5.80)
RDW: 12.2 % (ref 11.0–15.0)
Total Lymphocyte: 21.3 %
WBC: 7 10*3/uL (ref 3.8–10.8)

## 2020-05-15 ENCOUNTER — Other Ambulatory Visit: Payer: Self-pay

## 2020-05-15 ENCOUNTER — Ambulatory Visit
Admission: RE | Admit: 2020-05-15 | Discharge: 2020-05-15 | Disposition: A | Discharge status: Home / Self Care | Payer: Managed Care, Other (non HMO) | Source: Ambulatory Visit | Attending: Internal Medicine | Admitting: Internal Medicine

## 2020-05-15 DIAGNOSIS — R109 Unspecified abdominal pain: Secondary | ICD-10-CM | POA: Diagnosis present

## 2020-05-27 ENCOUNTER — Ambulatory Visit (INDEPENDENT_AMBULATORY_CARE_PROVIDER_SITE_OTHER): Payer: Managed Care, Other (non HMO) | Admitting: Pulmonary Disease

## 2020-05-27 ENCOUNTER — Other Ambulatory Visit: Payer: Self-pay

## 2020-05-27 ENCOUNTER — Encounter: Payer: Self-pay | Admitting: Pulmonary Disease

## 2020-05-27 VITALS — BP 136/88 | HR 64 | Temp 97.0°F | Ht 75.0 in | Wt 236.2 lb

## 2020-05-27 DIAGNOSIS — G4733 Obstructive sleep apnea (adult) (pediatric): Secondary | ICD-10-CM | POA: Diagnosis not present

## 2020-05-27 DIAGNOSIS — Z23 Encounter for immunization: Secondary | ICD-10-CM

## 2020-05-27 DIAGNOSIS — F17200 Nicotine dependence, unspecified, uncomplicated: Secondary | ICD-10-CM

## 2020-05-27 DIAGNOSIS — Z0000 Encounter for general adult medical examination without abnormal findings: Secondary | ICD-10-CM | POA: Insufficient documentation

## 2020-05-27 NOTE — Assessment & Plan Note (Signed)
Establish with CPAP Unknown severity of sleep apnea  Plan: Home sleep study ordered We will plan on ordering new CPAP after obtaining sleep study

## 2020-05-27 NOTE — Assessment & Plan Note (Signed)
Current cigar smoker  Plan: Recommend stopping smoking Pneumovax 23 today

## 2020-05-27 NOTE — Patient Instructions (Addendum)
You were seen today by Lauraine Rinne, NP  for:   1. OSA (obstructive sleep apnea)  - Home sleep test; Future  We will order a new home sleep study to further assess for obstructive sleep apnea so we can have a documented sleep study on file so we can get you established with a DME company  2. Smoker  We recommend that you stop smoking.  >>>You need to set a quit date >>>If you have friends or family who smoke, let them know you are trying to quit and not to smoke around you or in your living environment  Smoking Cessation Resources:  1 800 QUIT NOW  >>> Patient to call this resource and utilize it to help support her quit smoking >>> Keep up your hard work with stopping smoking  You can also contact the [facility] >>>For smoking cessation classes call [phone]  We do not recommend using e-cigarettes as a form of stopping smoking  You can sign up for smoking cessation support texts and information:  >>>https://smokefree.gov/smokefreetxt   3. Healthcare maintenance  Recommend Pneumovax 23 today  Great job already receiving your flu vaccine as well as your COVID-19 vaccinations  I would recommend considering obtaining your COVID-19 booster shot as you are over 6 months out from initial vaccination  We recommend today:  Orders Placed This Encounter  Procedures  . Home sleep test    Standing Status:   Future    Standing Expiration Date:   05/27/2021    Order Specific Question:   Where should this test be performed:    Answer:   [facility]   Orders Placed This Encounter  Procedures  . Home sleep test   No orders of the defined types were placed in this encounter.   Follow Up:    Return in about 3 months (around 08/27/2020), or if symptoms worsen or fail to improve, for [facility], Follow up with Dr. Halford Chessman, Follow up with Wyn Quaker FNP-C.   Notification of test results are managed in the following manner: If there are  any  recommendations or changes to the  plan of care discussed in office today,  we will contact you and let you know what they are. If you do not hear from [place], then your results are normal and you can view them through your  MyChart account , or a letter will be sent to you. Thank you again for trusting [place] with your care  - Thank you, [facility]    It is flu season:   >>> Best ways to protect herself from the flu: Receive the yearly flu vaccine, practice good hand hygiene washing with soap and also using hand sanitizer when available, eat a nutritious meals, get adequate rest, hydrate appropriately       Please contact the office if your symptoms worsen or you have concerns that you are not improving.   Thank you for choosing [facility] for your healthcare, and for allowing [place] to partner with you on your healthcare journey. I am thankful to be able to provide care to you today.   Wyn Quaker FNP-C    Pneumococcal Vaccine, Polyvalent solution for injection What is this medicine? PNEUMOCOCCAL VACCINE, POLYVALENT (NEU mo KOK al vak SEEN, pol ee VEY luhnt) is a vaccine to prevent pneumococcus bacteria infection. These bacteria are a major cause of ear infections, Strep throat infections, and serious pneumonia, meningitis, or blood infections worldwide. These  vaccines help the body to produce antibodies (protective substances) that help your body defend against these bacteria. This vaccine is recommended for people 76 years of age and older with health problems. It is also recommended for all adults over 4 years old. This vaccine will not treat an infection. This medicine may be used for other purposes; ask your health care provider or pharmacist if you have questions. COMMON BRAND NAME(S): Pneumovax 23 What should I tell my health care provider before I take this medicine? They need to know if you have any of these conditions:  bleeding problems  bone marrow or organ  transplant  cancer, Hodgkin's disease  fever  infection  immune system problems  low platelet count in the blood  seizures  an unusual or allergic reaction to pneumococcal vaccine, diphtheria toxoid, other vaccines, latex, other medicines, foods, dyes, or preservatives  pregnant or trying to get pregnant  breast-feeding How should I use this medicine? This vaccine is for injection into a muscle or under the skin. It is given by a health care professional. A copy of Vaccine Information Statements will be given before each vaccination. Read this sheet carefully each time. The sheet may change frequently. Talk to your pediatrician regarding the use of this medicine in children. While this drug may be prescribed for children as young as 79 years of age for selected conditions, precautions do apply. Overdosage: If you think you have taken too much of this medicine contact a poison control center or emergency room at once. NOTE: This medicine is only for you. Do not share this medicine with others. What if I miss a dose? It is important not to miss your dose. Call your doctor or health care professional if you are unable to keep an appointment. What may interact with this medicine?  medicines for cancer chemotherapy  medicines that suppress your immune function  medicines that treat or prevent blood clots like warfarin, enoxaparin, and dalteparin  steroid medicines like prednisone or cortisone This list may not describe all possible interactions. Give your health care provider a list of all the medicines, herbs, non-prescription drugs, or dietary supplements you use. Also tell them if you smoke, drink alcohol, or use illegal drugs. Some items may interact with your medicine. What should I watch for while using this medicine? Mild fever and pain should go away in 3 days or less. Report any unusual symptoms to your doctor or health care professional. What side effects may I notice from  receiving this medicine? Side effects that you should report to your doctor or health care professional as soon as possible:  allergic reactions like skin rash, itching or hives, swelling of the face, lips, or tongue  breathing problems  confused  fever over 102 degrees F  pain, tingling, numbness in the hands or feet  seizures  unusual bleeding or bruising  unusual muscle weakness Side effects that usually do not require medical attention (report to your doctor or health care professional if they continue or are bothersome):  aches and pains  diarrhea  fever of 102 degrees F or less  headache  irritable  loss of appetite  pain, tender at site where injected  trouble sleeping This list may not describe all possible side effects. Call your doctor for medical advice about side effects. You may report side effects to FDA at 1-800-FDA-1088. Where should I keep my medicine? This does not apply. This vaccine is given in a clinic, pharmacy, doctor's office, or other  health care setting and will not be stored at home. NOTE: This sheet is a summary. It may not cover all possible information. If you have questions about this medicine, talk to your doctor, pharmacist, or health care provider.  2020 Elsevier/Gold Standard (2008-02-23 14:32:37)

## 2020-05-27 NOTE — Progress Notes (Signed)
@Patient  ID: Curtis Underwood, male    DOB: 06-13-1956, 64 y.o.   MRN: [id number]  Chief Complaint  Patient presents with   Follow-up    wants to start fresh with a new sleep study and new machine/equipment.    Referring provider: Towanda Malkin*  HPI:  64 year old male current smoker followed in our office for obstructive sleep apnea  PMH: GERD, dyslipidemia, postnasal drip Smoker/ Smoking History: Current smoker. Smokes cigars. Smokes about ever 3 weeks.  Maintenance: None Pt of: DK  05/27/2020  - Visit   64 year old male followed in our office for obstructive sleep apnea.  DME company is [employer]  Patient presenting to office today because he is concerned that his CPAP may be directly affected by the recall.  He had a CPAP for over 10 years.  He would like to have a new CPAP ordered today.  We will discuss this.  After further review and contacting [employer] they reported that the patient does not have a sleep study on file which did not allow him to establish with [employer] overall.  Patient will need new sleep study.  We will discuss this today.  Patient does currently smoke.  Occasional cigar use once every 3 weeks or so.  He is up-to-date with his flu vaccines as well as COVID-19 vaccinations.  He is due for COVID-19 booster.  He has never received the pneumonia vaccine before we will discuss this today.  Questionaires / Pulmonary Flowsheets:   ACT:  No flowsheet data found.  MMRC: No flowsheet data found.  Epworth:  Results of the Epworth flowsheet 10/27/2018  Sitting and reading 0  Watching TV 1  Sitting, inactive in a public place (e.g. a theatre or a meeting) 0  As a passenger in a car for an hour without a break 0  Lying down to rest in the afternoon when circumstances permit 3  Sitting and talking to someone 0  Sitting quietly after a lunch without alcohol 0  In a car, while stopped for a few minutes in traffic 0  Total score 4    Tests:   FENO:  No  results found for: NITRICOXIDE  PFT: No flowsheet data found.  WALK:  No flowsheet data found.  Imaging: US Renal  Result Date: 05/16/2020 CLINICAL DATA:  64 year old male with flank pain x1 month. EXAM: RENAL / URINARY TRACT ULTRASOUND COMPLETE COMPARISON:  None. FINDINGS: Right Kidney: Renal measurements: 10.9 x 5.1 x 4.9 cm = volume: 144 mL. Echogenicity within normal limits. No mass or hydronephrosis visualized. Left Kidney: Renal measurements: 11.9 x 6.5 x 5.5 cm = volume: 224 mL. Echogenicity within normal limits. No mass or hydronephrosis visualized. Bladder: Appears normal for degree of bladder distention. Other: Fatty infiltration of the liver. IMPRESSION: 1. Unremarkable kidneys and urinary bladder. 2. Fatty liver. Electronically Signed   By: Anner Crete M.D.   On: 05/16/2020 23:33    Lab Results:  CBC    Component Value Date/Time   WBC 7.0 05/08/2020 0935   RBC 5.24 05/08/2020 0935   HGB 16.0 05/08/2020 0935   HCT 47.7 05/08/2020 0935   PLT 221 05/08/2020 0935   MCV 91.0 05/08/2020 0935   MCH 30.5 05/08/2020 0935   MCHC 33.5 05/08/2020 0935   RDW 12.2 05/08/2020 0935   LYMPHSABS 1,491 05/08/2020 0935   MONOABS 544 11/16/2016 1235   EOSABS 140 05/08/2020 0935   BASOSABS 77 05/08/2020 0935    BMET    Component Value Date/Time  NA 141 05/08/2020 0935   K 4.5 05/08/2020 0935   CL 105 05/08/2020 0935   CO2 23 05/08/2020 0935   GLUCOSE 88 05/08/2020 0935   BUN 12 05/08/2020 0935   CREATININE 1.13 05/08/2020 0935   CALCIUM 9.3 05/08/2020 0935   GFRNONAA 69 05/08/2020 0935   GFRAA 80 05/08/2020 0935    BNP No results found for: BNP  ProBNP No results found for: PROBNP  Specialty Problems      Pulmonary Problems   OSA (obstructive sleep apnea)   Seasonal allergic rhinitis      No Known Allergies  Immunization History  Administered Date(s) Administered   Influenza,inj,Quad PF,6+ Mos 05/26/2015, 04/14/2018, 04/05/2019, 05/08/2020   PFIZER  SARS-COV-2 Vaccination 10/08/2019, 10/30/2019   Pneumococcal Polysaccharide-23 05/27/2020   Tdap 07/24/2018    Past Medical History:  Diagnosis Date   Hyperlipidemia    Sleep apnea     Tobacco History: Social History   Tobacco Use  Smoking Status Current Some Day Smoker   Types: Cigars  Smokeless Tobacco Never Used   Ready to quit: Yes Counseling given: Yes   Continue to not smoke  Outpatient Encounter Medications as of 05/27/2020  Medication Sig   aspirin 81 MG EC tablet TAKE 1 TABLET BY MOUTH EVERY DAY   atorvastatin (LIPITOR) 80 MG tablet TAKE 1 TABLET BY MOUTH EVERY DAY   azelastine (ASTELIN) 0.1 % nasal spray Place 2 sprays into both nostrils 2 (two) times daily. Use in each nostril as directed   cetirizine (ZYRTEC) 10 MG tablet Take 10 mg by mouth as needed for allergies.   ezetimibe (ZETIA) 10 MG tablet Take 1 tablet (10 mg total) by mouth daily.   fluticasone (FLONASE) 50 MCG/ACT nasal spray PLACE 2 SPRAYS INTO EACH NOSTRIL EVERY DAY   omeprazole (PRILOSEC) 20 MG capsule TAKE 1 CAPSULE BY MOUTH EVERY DAY   tiZANidine (ZANAFLEX) 4 MG tablet Take 4 mg by mouth every 6 (six) hours as needed for muscle spasms. As needed for muscle spasms   No facility-administered encounter medications on file as of 05/27/2020.     Review of Systems  Review of Systems  Constitutional: Negative for activity change, chills, fatigue, fever and unexpected weight change.  HENT: Positive for congestion, postnasal drip and rhinorrhea. Negative for sinus pressure, sinus pain and sore throat.   Eyes: Negative.   Respiratory: Negative for cough, shortness of breath and wheezing.   Cardiovascular: Negative for chest pain and palpitations.  Gastrointestinal: Negative for constipation, diarrhea, nausea and vomiting.  Endocrine: Negative.   Genitourinary: Negative.   Musculoskeletal: Negative.   Skin: Negative.   Neurological: Negative for dizziness and headaches.    Psychiatric/Behavioral: Negative.  Negative for dysphoric mood. The patient is not nervous/anxious.   All other systems reviewed and are negative.    Physical Exam  BP 136/88 (BP Location: Left Arm, Patient Position: Sitting, Cuff Size: Normal)    Pulse 64    Temp (!) 97 F (36.1 C) (Temporal)    Ht 6\' 3"  (1.905 m)    Wt 236 lb 3.2 oz (107.1 kg)    SpO2 98%    BMI 29.52 kg/m   Wt Readings from Last 5 Encounters:  05/27/20 236 lb 3.2 oz (107.1 kg)  05/08/20 234 lb 14.4 oz (106.5 kg)  01/31/20 231 lb 1.6 oz (104.8 kg)  05/03/19 237 lb 4.8 oz (107.6 kg)  10/27/18 231 lb (104.8 kg)    BMI Readings from Last 5 Encounters:  05/27/20 29.52 kg/m  05/08/20 30.16 kg/m  01/31/20 29.67 kg/m  05/03/19 30.47 kg/m  10/27/18 29.66 kg/m     Physical Exam Vitals and nursing note reviewed.  Constitutional:      General: He is not in acute distress.    Appearance: Normal appearance. He is obese.  HENT:     Head: Normocephalic and atraumatic.     Right Ear: Hearing and external ear normal.     Left Ear: Hearing and external ear normal.     Nose: Rhinorrhea present. No mucosal edema.     Right Turbinates: Not enlarged.     Left Turbinates: Not enlarged.     Mouth/Throat:     Mouth: Mucous membranes are dry.     Pharynx: Oropharynx is clear. No oropharyngeal exudate.     Comments: +PND, MP2 Eyes:     Pupils: Pupils are equal, round, and reactive to light.  Cardiovascular:     Rate and Rhythm: Normal rate and regular rhythm.     Pulses: Normal pulses.     Heart sounds: Normal heart sounds. No murmur heard.   Pulmonary:     Effort: Pulmonary effort is normal.     Breath sounds: Normal breath sounds. No decreased breath sounds, wheezing or rales.  Musculoskeletal:     Cervical back: Normal range of motion.     Right lower leg: No edema.     Left lower leg: No edema.  Lymphadenopathy:     Cervical: No cervical adenopathy.  Skin:    General: Skin is warm and dry.     Capillary  Refill: Capillary refill takes less than 2 seconds.     Findings: No erythema or rash.  Neurological:     General: No focal deficit present.     Mental Status: He is alert and oriented to person, place, and time.     Motor: No weakness.     Coordination: Coordination normal.     Gait: Gait is intact. Gait normal.  Psychiatric:        Mood and Affect: Mood normal.        Behavior: Behavior normal. Behavior is cooperative.        Thought Content: Thought content normal.        Judgment: Judgment normal.       Assessment & Plan:   Healthcare maintenance Plan: Pneumovax 23 today Recommend COVID-19 booster  OSA (obstructive sleep apnea) Establish with CPAP Unknown severity of sleep apnea  Plan: Home sleep study ordered We will plan on ordering new CPAP after obtaining sleep study  Smoker Current cigar smoker  Plan: Recommend stopping smoking Pneumovax 23 today    Return in about 3 months (around 08/27/2020), or if symptoms worsen or fail to improve, for [facility], Follow up with Dr. Halford Chessman, Follow up with Wyn Quaker FNP-C.   Lauraine Rinne, NP 05/27/2020   This appointment required 26 minutes of patient care (this includes precharting, chart review, review of results, face-to-face care, etc.).

## 2020-05-27 NOTE — Assessment & Plan Note (Signed)
Plan: Pneumovax 23 today Recommend COVID-19 booster 

## 2020-06-04 ENCOUNTER — Other Ambulatory Visit: Payer: Self-pay

## 2020-06-04 ENCOUNTER — Ambulatory Visit: Payer: Managed Care, Other (non HMO)

## 2020-06-04 DIAGNOSIS — G4733 Obstructive sleep apnea (adult) (pediatric): Secondary | ICD-10-CM

## 2020-06-05 ENCOUNTER — Ambulatory Visit: Payer: Managed Care, Other (non HMO) | Admitting: Internal Medicine

## 2020-06-06 ENCOUNTER — Telehealth: Payer: Self-pay | Admitting: Pulmonary Disease

## 2020-06-06 DIAGNOSIS — G4733 Obstructive sleep apnea (adult) (pediatric): Secondary | ICD-10-CM

## 2020-06-06 NOTE — Telephone Encounter (Signed)
06/06/2020  Patient's home sleep study has been reviewed by Dr. Halford Chessman.  Those results are listed below:  Home sleep study from 06/04/20 showed moderate to severe obstructive sleep apnea with an AHI of 29.7 and SpO2 low of 79%.     Please let the patient know that his home sleep study showed moderate to severe obstructive sleep apnea.  He stop breathing around 29.7 times an hour.  We would recommend CPAP therapy for treatment.  If the patient has additional questions or concerns he can be scheduled for a follow-up visit in the [place] office or [place] office.  If the patient is agreeable to CPAP therapy start  Please go ahead and place an order for:  New CPAP start Mask of choice DME of choice Supplies Patient will need a 65-month follow-up to document least 30-day compliance of CPAP therapy in the [facility] office  Wyn Quaker, FNP

## 2020-06-09 NOTE — Telephone Encounter (Signed)
Lm for patient.  

## 2020-06-10 NOTE — Telephone Encounter (Signed)
APAP setting 5-15   Wyn Quaker, FNP

## 2020-06-10 NOTE — Telephone Encounter (Signed)
Pt returning missed call. Can be reached at [phone]

## 2020-06-10 NOTE — Telephone Encounter (Signed)
Order has been placed for cpap.  Nothing further needed.

## 2020-06-10 NOTE — Telephone Encounter (Signed)
Patient is aware of below results and voiced his understanding.  Patient would like to proceed with cpap.  appt scheduled for 08/29/2019 at 11:30   Brain, what you like settings to be?

## 2020-06-17 ENCOUNTER — Telehealth: Payer: Self-pay | Admitting: Pulmonary Disease

## 2020-06-17 DIAGNOSIS — G4733 Obstructive sleep apnea (adult) (pediatric): Secondary | ICD-10-CM

## 2020-06-17 NOTE — Telephone Encounter (Signed)
Called and spoke to patient, who stated that [employer] is requesting a generic Rx for cpap machine.  Order stated resmed and resmed machines are no back order.  I have corrected Rx and resent to [employer].  Patient is aware and voiced his understanding. Nothing further needed.

## 2020-07-02 NOTE — Progress Notes (Signed)
Patient ID: Curtis Underwood, male    DOB: September 27, 1955, 64 y.o.   MRN: [id number]  PCP: Towanda Malkin, MD  Chief Complaint  Patient presents with  . Follow-up    Subjective:   Curtis Underwood is a 64 y.o. male, presents to clinic with CC of the following:  Chief Complaint  Patient presents with  . Follow-up    HPI:  Patient is a 64 year old male  Follows up today from his January 31, 2020 visit. Communication from the lab results done at that visit included the following:  The complete metabolic panel was all normal with the only exception being the glucose being very slightly above desired range at 101. We can continue to monitor presently. The complete blood count was normal. The lipid panel showed that your cholesterol was good at 170, with the LDL cholesterol (lousy type) slightly above the desired range of 105 (desired is less than 100). Continuing your current medication regimen is reasonable presently, with daily use of the atorvastatin important to continue. The PSA (prostate screening test) was good at 1.2 The vitamin D level was slightly below the desired range at 27. This is consistent with vitamin D insufficiency, not full-blown deficiency. I would recommend an over-the-counter vitamin D supplement that contains 1000 international units of vitamin D daily to help presently  He was seen 05/08/2020 for back/flank pain, treated conservatively, with his blood pressure borderline high possibly due to the pain.  An ultrasound was obtained as part of that evaluation with fatty liver noted, and unremarkable bladder and kidneys.  A CBC, BMP, and urine dip were unremarkable.  All in all, he notes things have been going well. Notes some more sinus congestion in the recent past and frustrations trying to get his CPAP machine, as the 1 he recently obtained was recalled.  He has been working through pulmonary to try to help with this.  Allergic rhinitis: Using  nonsedating antihistamine - zyrtec, astelin,  and flonase presently.  Also uses an occasional Mucinex product.  Has some increased congestion in past month, sometimes discolored.  No fevers, no bad sinus pains.  He notes it has increased since his CPAP is no longer able to be used, and he did have humidification with that CPAP which he thinks was helpful.  I did note the dryness issues that can arise with using heat in the colder weather now.  Neck Pain:He did attend PT in the past for neck pain, but it was very expensive and only able to go to 3 sessions.  Was given exercises to do at home and he has been doing them during his night shift hours at work. 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  Notes he still uses one about every 3 weeks, and is helpful when he does use.  Obesity: Wt Readings from Last 3 Encounters:  07/03/20 235 lb 12.8 oz (107 kg)  05/27/20 236 lb 3.2 oz (107.1 kg)  05/08/20 234 lb 14.4 oz (106.5 kg)   Weight stable Exercise  - He is trying to do more walking, at least 30 minutes when he is able to; now only about once or twice a week as very busy.  Encouraged to do more frequently again today Diet - Does not eat out frequently, still fast food a bit, tries to eat a healthy diet.   GERD:  taking omeprazole  PRN(Notes when he gets symptoms, takes for a couple days before trying off again). About 4-5 times a month Discussed risk of long-term PPI use.  Also the importance of reflux precautions noted Denies any dark or black stools, chronic abdominal pains,  Has been stable in the recent past with intermittent use of the PPI.  OSA:  Saw pulmonary 05/27/2020 and a new home sleep study obtained, had a new machine and then was recalled, not using presently, has a f/u with pulm again in Jan.  Has been frustrated with attempts to get a new machine in the recent past. States the  CPAP is helpful, but not able to use presently due to the recall.  Tobacco use: Still smokes cigars occasionally.  Hx TIA: He was referred to neurologist in the past but didn't go Has not had any more episodes.  He is taking lipitor,takingASA 81mg .   Hyperlipidemia:  Taking lipitor 40mg  (has history of myalgias with other statins), Zetia - not taking every day, still takes on occasions as concern with bothering stomach as the reason not take daily.       Lab Results  Component Value Date   CHOL 170 01/31/2020   HDL 49 01/31/2020   LDLCALC 105 (H) 01/31/2020   TRIG 75 01/31/2020   CHOLHDL 3.5 01/31/2020    no myalgias .    Denies any chest pains, palpitations, shortness of breath, increased lower extremity swelling.  No increased headaches or vision changes, and no history of high blood pressure in his past.   Vitamin D insufficiency Last vitamin D Lab Results  Component Value Date   VD25OH 27 (L) 01/31/2020   A vitamin D supplement of 1000 international units recommended daily previously, not yet started    currently works for [employer], does weekend shifts of 12+ hours which is often exhausting, thinking about retirement next year       Patient Active Problem List   Diagnosis Date Noted  . Healthcare maintenance 05/27/2020  . Elevated BP without diagnosis of hypertension 05/08/2020  . Flank pain 05/08/2020  . Vitamin D insufficiency 01/31/2020  . Lesion of lip 05/03/2019  . Overweight (BMI 25.0-29.9) 07/24/2018  . History of transient ischemic attack (TIA) 04/14/2018  . Gastroesophageal reflux disease without esophagitis 05/26/2015  . Seasonal allergic rhinitis 05/26/2015  . Neck pain 05/26/2015  . Dyslipidemia 05/26/2015  . Smoker 05/26/2015  . OSA (obstructive sleep apnea) 05/26/2015  . PND (post-nasal drip) 05/26/2015      Current Outpatient Medications:  .  aspirin 81 MG EC tablet, TAKE 1 TABLET BY MOUTH EVERY DAY, Disp: 90 tablet, Rfl: 1 .   atorvastatin (LIPITOR) 80 MG tablet, TAKE 1 TABLET BY MOUTH EVERY DAY, Disp: 90 tablet, Rfl: 1 .  azelastine (ASTELIN) 0.1 % nasal spray, Place 2 sprays into both nostrils 2 (two) times daily. Use in each nostril as directed, Disp: 30 mL, Rfl: 12 .  cetirizine (ZYRTEC) 10 MG tablet, Take 10 mg by mouth as needed for allergies., Disp: , Rfl:  .  ezetimibe (ZETIA) 10 MG tablet, Take 1 tablet (10 mg total) by mouth daily., Disp: 90 tablet, Rfl: 3 .  fluticasone (FLONASE) 50 MCG/ACT nasal spray, PLACE 2 SPRAYS INTO EACH NOSTRIL EVERY DAY, Disp: 48 g, Rfl: 6 .  omeprazole (PRILOSEC) 20 MG capsule, TAKE 1 CAPSULE BY MOUTH EVERY DAY, Disp: 90 capsule, Rfl: 1 .  tiZANidine (ZANAFLEX) 4 MG tablet, Take 4 mg by mouth every 6 (six) hours as needed for muscle  spasms. As needed for muscle spasms, Disp: , Rfl:    No Known Allergies   Past Surgical History:  Procedure Laterality Date  . APPENDECTOMY       Family History  Adopted: Yes  Family history unknown: Yes     Social History   Tobacco Use  . Smoking status: Current Some Day Smoker    Types: Cigars  . Smokeless tobacco: Never Used  Substance Use Topics  . Alcohol use: Yes    Alcohol/week: 0.0 standard drinks    Comment: occasionally    With staff assistance, above reviewed with the patient today.  ROS: As per HPI, otherwise no specific complaints on a limited and focused system review   No results found for this or any previous visit (from the past 72 hour(s)).   PHQ2/9: Depression screen [facility] 2/9 07/03/2020 05/08/2020 01/31/2020 05/03/2019 10/26/2018  Decreased Interest 0 0 0 0 0  Down, Depressed, Hopeless 0 0 0 0 0  PHQ - 2 Score 0 0 0 0 0  Altered sleeping - - 0 0 0  Tired, decreased energy - - 0 0 0  Change in appetite - - 0 0 0  Feeling bad or failure about yourself  - - 0 0 0  Trouble concentrating - - 0 0 0  Moving slowly or fidgety/restless - - 0 0 0  Suicidal thoughts - - 0 0 0  PHQ-9 Score - - 0 0 0  Difficult doing  work/chores - - Not difficult at all Not difficult at all Not difficult at all   PHQ-2/9 Result is neg  Fall Risk: Fall Risk  07/03/2020 05/08/2020 01/31/2020 05/03/2019 10/26/2018  Falls in the past year? 0 0 0 0 0  Number falls in past yr: 0 0 0 0 0  Injury with Fall? 0 0 0 0 0  Follow up - - - - Falls evaluation completed      Objective:   Vitals:   07/03/20 0809  BP: 140/86  Pulse: 73  Resp: 16  Temp: 98.4 F (36.9 C)  TempSrc: Oral  SpO2: 98%  Weight: 235 lb 12.8 oz (107 kg)  Height: 6\' 3"  (1.905 m)    Body mass index is 29.47 kg/m. Recheck blood pressure was 128/82 by myself on exam with a large adult cuff on the left. Physical Exam    NAD, masked, very pleasant HEENT - [place]/AT, sclera anicteric, positive glasses, PERRL, EOMI, conj - non-inj'ed, nares patent with no big swollen turbinates, some soreness in the sinuses with palpation, maxillary sinus region, no marked increased pain with palpation, TMs and canals clear, pharynx clear Neck - supple, no adenopathy, carotids 2+ and = without bruits bilat Car - RRR without m/g/r Pulm- RR and effort normal at rest, CTA without wheeze or rales Abd - soft, NT diffusely, ND,  Back - no CVA tenderness Ext - no LE edema, . Neuro/psychiatric - affect was not flat, appropriate with conversation             Alert and oriented             Grossly non-focal - good strength on testing extremities, sensation intact to LT in distal extremities,              Speech normal  Results for orders placed or performed in visit on 05/08/20  CBC with Differential/Platelet  Result Value Ref Range   WBC 7.0 3.8 - 10.8 Thousand/uL   RBC 5.24 4.20 - 5.80 Million/uL  Hemoglobin 16.0 13.2 - 17.1 g/dL   HCT 47.7 38 - 50 %   MCV 91.0 80.0 - 100.0 fL   MCH 30.5 27.0 - 33.0 pg   MCHC 33.5 32.0 - 36.0 g/dL   RDW 12.2 11.0 - 15.0 %   Platelets 221 140 - 400 Thousand/uL   MPV 12.8 (H) 7.5 - 12.5 fL   Neutro Abs 4,655 1,500 - 7,800 cells/uL   Lymphs  Abs 1,491 850 - 3,900 cells/uL   Absolute Monocytes 637 200 - 950 cells/uL   Eosinophils Absolute 140 15.0 - 500.0 cells/uL   Basophils Absolute 77 0.0 - 200.0 cells/uL   Neutrophils Relative % 66.5 %   Total Lymphocyte 21.3 %   Monocytes Relative 9.1 %   Eosinophils Relative 2.0 %   Basophils Relative 1.1 %  BASIC METABOLIC PANEL WITH GFR  Result Value Ref Range   Glucose, Bld 88 65 - 99 mg/dL   BUN 12 7 - 25 mg/dL   Creat 1.13 0.70 - 1.25 mg/dL   GFR, Est Non African American 69 > OR = 60 mL/min/1.16m2   GFR, Est African American 80 > OR = 60 mL/min/1.107m2   BUN/Creatinine Ratio NOT APPLICABLE 6 - 22 (calc)   Sodium 141 135 - 146 mmol/L   Potassium 4.5 3.5 - 5.3 mmol/L   Chloride 105 98 - 110 mmol/L   CO2 23 20 - 32 mmol/L   Calcium 9.3 8.6 - 10.3 mg/dL  POCT urinalysis dipstick  Result Value Ref Range   Color, UA yellow    Clarity, UA clear    Glucose, UA Negative Negative   Bilirubin, UA Negative    Ketones, UA Negative    Spec Grav, UA 1.015 1.010 - 1.025   Blood, UA Negative    pH, UA 5.0 5.0 - 8.0   Protein, UA Negative Negative   Urobilinogen, UA 0.2 0.2 or 1.0 E.U./dL   Nitrite, UA Negative    Leukocytes, UA Negative Negative   Appearance Normal    Odor Normal    Last labs reviewed    Assessment & Plan:    1. Dyslipidemia Last lipid panel reviewed, very mild increase in LDL noted Importance of dietary modifications and increased physical activity noted to help, and also continue the statin product presently. Has been taking the Zetia only very intermittently, and noted the important medicine to take of the 2 is the statin.  2. Gastroesophageal reflux disease without esophagitis Takes the omeprazole intermittently, not daily. Discussed concerns with long-term chronic use of PPIs that exist, and do feel continuing to take as he is doing is appropriate. Reflux precautions importance also noted.  3. Seasonal allergic rhinitis, unspecified trigger Do feel  his symptoms are more rhinitis based, and do not feel an antibiotic is indicated for a bacterial sinusitis as discussed at length with him today Recommended adding a Zyrtec-D product in place of Zyrtec in the short-term to help with congestion symptoms in association. Also continuing the Flonase product once daily. Did note that dryness can be contributing, and recommended use of a humidifier at night, as he is not getting humidification with any CPAP device presently. If more infectious concerns develop, will follow up again.  4. Obstructive sleep apnea Did have another sleep study done, and had been doing well with the CPAP briefly, although the new machine was recalled and he is not using CPAP presently Expressed frustration trying to get a newer machine and working with pulmonary to try to do  so. Has a follow-up with pulmonary planned again in January  5. Overweight (BMI 25.0-29.9) Weight has been relatively stable in the recent past. The importance of healthy weight maintenance emphasized today  6. Elevated BP without diagnosis of hypertension His blood pressure was borderline on first check, much improved on recheck  Do not feel there are concerns for hypotension presently, and continue to monitor  7. Vitamin D insufficiency He has not been taking a vitamin D supplement as recommended previously, and strongly encouraged to do so with an over-the-counter vitamin D supplement, 1000 international units daily  8. Tobacco use Encouraged smoking cessation, although he did note he enjoys cigars occasionally.  9. History of transient ischemic attack (TIA) Has had no recurrent episodes of concern. He remains on a baby aspirin daily and a statin.  10. Neck pain -intermittent Continue with periodic exercises that were recommended with physical therapy's input previously, and can continue the as needed muscle relaxant at nighttime which is helpful as he is using it very  sparingly.    He will follow up again in approximately 6 months time, sooner as needed. Did notify him today that his follow-up will be with a new provider as I will be leaving this practice prior to his planned follow-up.     Towanda Malkin, MD 07/03/20 8:40 AM

## 2020-07-03 ENCOUNTER — Ambulatory Visit (INDEPENDENT_AMBULATORY_CARE_PROVIDER_SITE_OTHER): Payer: Managed Care, Other (non HMO) | Admitting: Internal Medicine

## 2020-07-03 ENCOUNTER — Encounter: Payer: Self-pay | Admitting: Internal Medicine

## 2020-07-03 ENCOUNTER — Other Ambulatory Visit: Payer: Self-pay

## 2020-07-03 VITALS — BP 140/86 | HR 73 | Temp 98.4°F | Resp 16 | Ht 75.0 in | Wt 235.8 lb

## 2020-07-03 DIAGNOSIS — E785 Hyperlipidemia, unspecified: Secondary | ICD-10-CM

## 2020-07-03 DIAGNOSIS — K219 Gastro-esophageal reflux disease without esophagitis: Secondary | ICD-10-CM

## 2020-07-03 DIAGNOSIS — E559 Vitamin D deficiency, unspecified: Secondary | ICD-10-CM

## 2020-07-03 DIAGNOSIS — J302 Other seasonal allergic rhinitis: Secondary | ICD-10-CM | POA: Diagnosis not present

## 2020-07-03 DIAGNOSIS — R030 Elevated blood-pressure reading, without diagnosis of hypertension: Secondary | ICD-10-CM

## 2020-07-03 DIAGNOSIS — G4733 Obstructive sleep apnea (adult) (pediatric): Secondary | ICD-10-CM | POA: Diagnosis not present

## 2020-07-03 DIAGNOSIS — E663 Overweight: Secondary | ICD-10-CM

## 2020-07-03 DIAGNOSIS — M542 Cervicalgia: Secondary | ICD-10-CM

## 2020-07-03 DIAGNOSIS — Z720 Tobacco use: Secondary | ICD-10-CM

## 2020-07-03 DIAGNOSIS — Z8673 Personal history of transient ischemic attack (TIA), and cerebral infarction without residual deficits: Secondary | ICD-10-CM

## 2020-08-02 ENCOUNTER — Encounter: Payer: Self-pay | Admitting: Internal Medicine

## 2020-08-02 ENCOUNTER — Telehealth: Payer: Managed Care, Other (non HMO) | Admitting: Nurse Practitioner

## 2020-08-02 DIAGNOSIS — J0101 Acute recurrent maxillary sinusitis: Secondary | ICD-10-CM | POA: Diagnosis not present

## 2020-08-02 MED ORDER — AMOXICILLIN-POT CLAVULANATE 875-125 MG PO TABS: 1.0000 | ORAL_TABLET | Freq: Two times a day (BID) | ORAL | 0 refills | Status: DC

## 2020-08-02 NOTE — Progress Notes (Signed)

## 2020-08-04 ENCOUNTER — Other Ambulatory Visit: Payer: Self-pay | Admitting: Internal Medicine

## 2020-08-26 ENCOUNTER — Telehealth: Payer: Self-pay | Admitting: Pulmonary Disease

## 2020-08-26 DIAGNOSIS — G4733 Obstructive sleep apnea (adult) (pediatric): Secondary | ICD-10-CM

## 2020-08-26 NOTE — Telephone Encounter (Signed)
Called and spoke to patient.  Patient is questioning if he should come to 08/29/2019 visit, as he has not received new cpap yet.  Order was placed to [facility] on 06/17/2020 for replacement. Patient stated that he is getting the run around with [facility].  Lm for Melissa with [facility] for update.

## 2020-08-26 NOTE — Telephone Encounter (Signed)
Spoke to [place] with [facility], who stated that he would reach to the team who currently has the order.  According to Brad's record, patient has not been contacted since 06/27/2020. Will await update.

## 2020-08-27 NOTE — Telephone Encounter (Signed)
Spoke to patient, who stated that he spoke with [facility] yesterday and they are unsure when they can provide him with a machine.  Patient is requesting to switch to different DME. He is aware that there will be a delay in receiving a cpap machine with all DME companies due to the recall.  Order has been placed for new DME.  Patient stated that he will call back to schedule OV once setup. Nothing further needed at this time.

## 2020-08-27 NOTE — Telephone Encounter (Signed)
Pt returning missed call. Appt for 08/28/20 was cancelled yesterday.

## 2020-08-27 NOTE — Telephone Encounter (Signed)
Lm for patient  to see if he wished to keep schedule visit for 08/28/20 or reschedule.

## 2020-08-28 ENCOUNTER — Ambulatory Visit: Payer: Managed Care, Other (non HMO) | Admitting: Pulmonary Disease

## 2020-10-13 ENCOUNTER — Other Ambulatory Visit: Payer: Self-pay | Admitting: Family Medicine

## 2020-10-13 ENCOUNTER — Other Ambulatory Visit: Payer: Self-pay

## 2020-10-13 DIAGNOSIS — J302 Other seasonal allergic rhinitis: Secondary | ICD-10-CM

## 2020-10-13 MED ORDER — FLUTICASONE PROPIONATE 50 MCG/ACT NA SUSP: NASAL | 6 refills | Status: DC

## 2020-10-13 MED ORDER — TIZANIDINE HCL 4 MG PO TABS: 4.0000 mg | ORAL_TABLET | Freq: Every day | ORAL | 0 refills | Status: DC | PRN

## 2020-10-14 ENCOUNTER — Other Ambulatory Visit: Payer: Self-pay

## 2020-10-14 DIAGNOSIS — K219 Gastro-esophageal reflux disease without esophagitis: Secondary | ICD-10-CM

## 2020-10-14 MED ORDER — OMEPRAZOLE 20 MG PO CPDR: DELAYED_RELEASE_CAPSULE | ORAL | 1 refills | Status: DC

## 2020-11-05 ENCOUNTER — Other Ambulatory Visit: Payer: Self-pay | Admitting: Family Medicine

## 2020-11-05 NOTE — Telephone Encounter (Signed)
Requested medication (s) are due for refill today:  Provider to review  Requested medication (s) are on the active medication list:   Yes  Future visit scheduled:   Yes   Last ordered: 10/13/2020 #30, 0 refills  Non delegated refill   Requested Prescriptions  Pending Prescriptions Disp Refills   tiZANidine (ZANAFLEX) 4 MG tablet [Pharmacy Med Name: TIZANIDINE HCL 4 MG TABLET] 30 tablet 0    Sig: Take 1 tablet (4 mg total) by mouth daily as needed for muscle spasms. As needed for muscle spasms      Not Delegated - Cardiovascular:  Alpha-2 Agonists - tizanidine Failed - 11/05/2020  1:34 PM      Failed - This refill cannot be delegated      Passed - Valid encounter within last 6 months    Recent Outpatient Visits           4 months ago Dyslipidemia   [place], MD   6 months ago Need for immunization against influenza   [place], MD   9 months ago Dyslipidemia   [place], MD   1 year ago Seasonal allergic rhinitis, unspecified trigger   [place], FNP   2 years ago Seasonal allergic rhinitis, unspecified trigger   [place], [place], [place]       Future Appointments             In 3 months Delsa Grana, PA-C [facility], [facility]

## 2020-11-22 ENCOUNTER — Other Ambulatory Visit: Payer: Self-pay | Admitting: Family Medicine

## 2020-11-23 NOTE — Telephone Encounter (Signed)
Requested medication (s) are due for refill today: no  Requested medication (s) are on the active medication list: yes  Last refill:  11/06/20  Future visit scheduled: yes  Notes to clinic:  non delegated med   Requested Prescriptions  Pending Prescriptions Disp Refills   tiZANidine (ZANAFLEX) 4 MG tablet [Pharmacy Med Name: TIZANIDINE HCL 4 MG TABLET] 30 tablet 0    Sig: TAKE 1 TABLET (4 MG TOTAL) BY MOUTH DAILY AS NEEDED FOR MUSCLE SPASMS. AS NEEDED FOR MUSCLE SPASMS      Not Delegated - Cardiovascular:  Alpha-2 Agonists - tizanidine Failed - 11/22/2020  8:45 PM      Failed - This refill cannot be delegated      Passed - Valid encounter within last 6 months    Recent Outpatient Visits           4 months ago Dyslipidemia   [place], MD   6 months ago Need for immunization against influenza   [place], MD   9 months ago Dyslipidemia   [place], MD   1 year ago Seasonal allergic rhinitis, unspecified trigger   [place], FNP   2 years ago Seasonal allergic rhinitis, unspecified trigger   [place], [place], [place]       Future Appointments             In 2 months Delsa Grana, PA-C [facility], [facility]

## 2020-11-24 NOTE — Telephone Encounter (Signed)
Pt has an appt on 02/05/21

## 2020-12-12 ENCOUNTER — Other Ambulatory Visit: Payer: Self-pay | Admitting: Family Medicine

## 2020-12-12 NOTE — Telephone Encounter (Signed)
lvm to inform that prescription has been sent to pharmacy

## 2020-12-12 NOTE — Telephone Encounter (Signed)
Requested medication (s) are due for refill today: yes  Requested medication (s) are on the active medication list: yes  Last refill: 11/24/20  Future visit scheduled: no  Notes to clinic:  not delegated    Requested Prescriptions  Pending Prescriptions Disp Refills   tiZANidine (ZANAFLEX) 4 MG tablet [Pharmacy Med Name: TIZANIDINE HCL 4 MG TABLET] 90 tablet 1    Sig: TAKE 1 TABLET (4 MG TOTAL) BY MOUTH DAILY AS NEEDED FOR MUSCLE SPASMS. AS NEEDED FOR MUSCLE SPASMS      Not Delegated - Cardiovascular:  Alpha-2 Agonists - tizanidine Failed - 12/12/2020  9:35 AM      Failed - This refill cannot be delegated      Passed - Valid encounter within last 6 months    Recent Outpatient Visits           5 months ago Dyslipidemia   [place], MD   7 months ago Need for immunization against influenza   [place], MD   10 months ago Dyslipidemia   [place], MD   1 year ago Seasonal allergic rhinitis, unspecified trigger   [place], FNP   2 years ago Seasonal allergic rhinitis, unspecified trigger   [place], [place]       Future Appointments             In 1 month Delsa Grana, PA-C [facility], [facility]

## 2020-12-12 NOTE — Telephone Encounter (Signed)
Last seen 12.2.2021 next sch'd with Delsa Grana for 7.7.2022

## 2021-02-03 ENCOUNTER — Other Ambulatory Visit: Payer: Self-pay | Admitting: Family Medicine

## 2021-02-04 NOTE — Telephone Encounter (Signed)
Requested medication (s) are due for refill today: no  Requested medication (s) are on the active medication list: yes   Last refill: 12/12/2020  Future visit scheduled: yes  Notes to clinic:  This refill cannot be delegated   Requested Prescriptions  Pending Prescriptions Disp Refills   tiZANidine (ZANAFLEX) 4 MG tablet [Pharmacy Med Name: TIZANIDINE HCL 4 MG TABLET] 90 tablet 0    Sig: TAKE 1 TABLET (4 MG TOTAL) BY MOUTH DAILY AS NEEDED FOR MUSCLE SPASMS. AS NEEDED FOR MUSCLE SPASMS      Not Delegated - Cardiovascular:  Alpha-2 Agonists - tizanidine Failed - 02/03/2021 10:57 PM      Failed - This refill cannot be delegated      Failed - Valid encounter within last 6 months    Recent Outpatient Visits           7 months ago Dyslipidemia   [facility] Towanda Malkin, MD   9 months ago Need for immunization against influenza   [place], MD   1 year ago Dyslipidemia   [place], MD   1 year ago Seasonal allergic rhinitis, unspecified trigger   [place], FNP   2 years ago Seasonal allergic rhinitis, unspecified trigger   [place], [place]       Future Appointments             Tomorrow Delsa Grana, PA-C [facility], [facility]

## 2021-02-04 NOTE — Telephone Encounter (Signed)
Patient has appt tomorrow 02-05-2021

## 2021-02-05 ENCOUNTER — Encounter: Payer: Self-pay | Admitting: Family Medicine

## 2021-02-05 ENCOUNTER — Ambulatory Visit (INDEPENDENT_AMBULATORY_CARE_PROVIDER_SITE_OTHER): Payer: Managed Care, Other (non HMO) | Admitting: Family Medicine

## 2021-02-05 ENCOUNTER — Other Ambulatory Visit: Payer: Self-pay

## 2021-02-05 VITALS — BP 152/86 | HR 72 | Temp 98.2°F | Resp 16 | Ht 75.0 in | Wt 241.2 lb

## 2021-02-05 DIAGNOSIS — Z6830 Body mass index (BMI) 30.0-30.9, adult: Secondary | ICD-10-CM

## 2021-02-05 DIAGNOSIS — E785 Hyperlipidemia, unspecified: Secondary | ICD-10-CM

## 2021-02-05 DIAGNOSIS — E669 Obesity, unspecified: Secondary | ICD-10-CM | POA: Diagnosis not present

## 2021-02-05 DIAGNOSIS — G4733 Obstructive sleep apnea (adult) (pediatric): Secondary | ICD-10-CM

## 2021-02-05 DIAGNOSIS — Z125 Encounter for screening for malignant neoplasm of prostate: Secondary | ICD-10-CM

## 2021-02-05 DIAGNOSIS — J302 Other seasonal allergic rhinitis: Secondary | ICD-10-CM

## 2021-02-05 DIAGNOSIS — E66811 Obesity, class 1: Secondary | ICD-10-CM

## 2021-02-05 DIAGNOSIS — Z0000 Encounter for general adult medical examination without abnormal findings: Secondary | ICD-10-CM

## 2021-02-05 DIAGNOSIS — I10 Essential (primary) hypertension: Secondary | ICD-10-CM

## 2021-02-05 DIAGNOSIS — Z23 Encounter for immunization: Secondary | ICD-10-CM

## 2021-02-05 LAB — LIPID PANEL
Cholesterol: 156 mg/dL (ref <200)
HDL: 54 mg/dL (ref >=40)
LDL Cholesterol (Calc): 85 mg/dL (calc)
Non-HDL Cholesterol (Calc): 102 mg/dL (calc) (ref <130)
Total CHOL/HDL Ratio: 2.9 (calc) (ref <5.0)
Triglycerides: 80 mg/dL (ref <150)

## 2021-02-05 LAB — CBC WITH DIFFERENTIAL/PLATELET
Absolute Monocytes: 748 cells/uL (ref 200–950)
Basophils Absolute: 71 cells/uL (ref 0–200)
Basophils Relative: 0.8 %
Eosinophils Absolute: 329 cells/uL (ref 15–500)
Eosinophils Relative: 3.7 %
HCT: 46.1 % (ref 38.5–50.0)
Hemoglobin: 15.4 g/dL (ref 13.2–17.1)
Lymphs Abs: 1780 cells/uL (ref 850–3900)
MCH: 30.7 pg (ref 27.0–33.0)
MCHC: 33.4 g/dL (ref 32.0–36.0)
MCV: 91.8 fL (ref 80.0–100.0)
MPV: 12.2 fL (ref 7.5–12.5)
Monocytes Relative: 8.4 %
Neutro Abs: 5972 cells/uL (ref 1500–7800)
Neutrophils Relative %: 67.1 %
Platelets: 214 10*3/uL (ref 140–400)
RBC: 5.02 10*6/uL (ref 4.20–5.80)
RDW: 12.9 % (ref 11.0–15.0)
Total Lymphocyte: 20 %
WBC: 8.9 10*3/uL (ref 3.8–10.8)

## 2021-02-05 LAB — COMPLETE METABOLIC PANEL WITH GFR
AG Ratio: 2 (calc) (ref 1.0–2.5)
ALT: 41 U/L (ref 9–46)
AST: 23 U/L (ref 10–35)
Albumin: 4.6 g/dL (ref 3.6–5.1)
Alkaline phosphatase (APISO): 88 U/L (ref 35–144)
BUN: 12 mg/dL (ref 7–25)
CO2: 27 mmol/L (ref 20–32)
Calcium: 9.3 mg/dL (ref 8.6–10.3)
Chloride: 103 mmol/L (ref 98–110)
Creat: 1.15 mg/dL (ref 0.70–1.25)
GFR, Est African American: 78 mL/min/{1.73_m2} (ref >=60)
GFR, Est Non African American: 67 mL/min/{1.73_m2} (ref >=60)
Globulin: 2.3 g/dL (calc) (ref 1.9–3.7)
Glucose, Bld: 91 mg/dL (ref 65–99)
Potassium: 4.6 mmol/L (ref 3.5–5.3)
Sodium: 139 mmol/L (ref 135–146)
Total Bilirubin: 0.4 mg/dL (ref 0.2–1.2)
Total Protein: 6.9 g/dL (ref 6.1–8.1)

## 2021-02-05 LAB — PSA: PSA: 0.81 ng/mL (ref <=4.00)

## 2021-02-05 MED ORDER — ATORVASTATIN CALCIUM 80 MG PO TABS: 80.0000 mg | ORAL_TABLET | Freq: Every day | ORAL | 3 refills | Status: DC

## 2021-02-05 MED ORDER — ZOSTER VAC RECOMB ADJUVANTED 50 MCG/0.5ML IM SUSR: 0.5000 mL | Freq: Once | INTRAMUSCULAR | 1 refills | Status: AC

## 2021-02-05 MED ORDER — LISINOPRIL 10 MG PO TABS: 10.0000 mg | ORAL_TABLET | Freq: Every day | ORAL | 3 refills | Status: DC

## 2021-02-05 MED ORDER — MONTELUKAST SODIUM 10 MG PO TABS: 10.0000 mg | ORAL_TABLET | Freq: Every day | ORAL | 3 refills | Status: DC

## 2021-02-05 NOTE — Patient Instructions (Addendum)
[email or web address]">   DASH Eating Plan DASH stands for Dietary Approaches to Stop Hypertension. The DASH eating plan is a healthy eating plan that has been shown to: Reduce high blood pressure (hypertension). Reduce your risk for type 2 diabetes, heart disease, and stroke. Help with weight loss. What are tips for following this plan? Reading food labels Check food labels for the amount of salt (sodium) per serving. Choose foods with less than 5 percent of the Daily Value of sodium. Generally, foods with less than 300 milligrams (mg) of sodium per serving fit into this eating plan. To find whole grains, look for the word "whole" as the first word in the ingredient list. Shopping Buy products labeled as "low-sodium" or "no salt added." Buy fresh foods. Avoid canned foods and pre-made or frozen meals. Cooking Avoid adding salt when cooking. Use salt-free seasonings or herbs instead of table salt or sea salt. Check with your health care provider or pharmacist before using salt substitutes. Do not fry foods. Cook foods using healthy methods such as baking, boiling, grilling, roasting, and broiling instead. Cook with heart-healthy oils, such as olive, canola, avocado, soybean, or sunflower oil. Meal planning  Eat a balanced diet that includes: 4 or more servings of fruits and 4 or more servings of vegetables each day. Try to fill one-half of your plate with fruits and vegetables. 6-8 servings of whole grains each day. Less than 6 oz (170 g) of lean meat, poultry, or fish each day. A 3-oz (85-g) serving of meat is about the same size as a deck of cards. One egg equals 1 oz (28 g). 2-3 servings of low-fat dairy each day. One serving is 1 cup (237 mL). 1 serving of nuts, seeds, or beans 5 times each week. 2-3 servings of heart-healthy fats. Healthy fats called omega-3 fatty acids are found in foods such as walnuts, flaxseeds, fortified milks, and eggs.  These fats are also found in cold-water fish, such as sardines, salmon, and mackerel. Limit how much you eat of: Canned or prepackaged foods. Food that is high in trans fat, such as some fried foods. Food that is high in saturated fat, such as fatty meat. Desserts and other sweets, sugary drinks, and other foods with added sugar. Full-fat dairy products. Do not salt foods before eating. Do not eat more than 4 egg yolks a week. Try to eat at least 2 vegetarian meals a week. Eat more home-cooked food and less restaurant, buffet, and fast food.  Lifestyle When eating at a restaurant, ask that your food be prepared with less salt or no salt, if possible. If you drink alcohol: Limit how much you use to: 0-1 drink a day for women who are not pregnant. 0-2 drinks a day for men. Be aware of how much alcohol is in your drink. In the [place] one drink equals one 12 oz bottle of beer (355 mL), one 5 oz glass of wine (148 mL), or one 1 oz glass of hard liquor (44 mL). General information Avoid eating more than 2,300 mg of salt a day. If you have hypertension, you may need to reduce your sodium intake to 1,500 mg a day. Work with your health care provider to maintain a healthy body weight or to lose weight. Ask what an ideal weight is for you. Get at least 30 minutes of exercise that causes your heart to beat faster (aerobic exercise) most days of the week. Activities may include walking, swimming, or biking. Work with your health care  provider or dietitian to adjust your eating plan to your individual calorie needs. What foods should I eat? Fruits All fresh, dried, or frozen fruit. Canned fruit in natural juice (without addedsugar). Vegetables Fresh or frozen vegetables (raw, steamed, roasted, or grilled). Low-sodium or reduced-sodium tomato and vegetable juice. Low-sodium or reduced-sodium tomatosauce and tomato paste. Low-sodium or reduced-sodium canned vegetables. Grains Whole-grain or  whole-wheat bread. Whole-grain or whole-wheat pasta. Brown rice. [place]. Bulgur. Whole-grain and low-sodium cereals. Pita bread.Low-fat, low-sodium crackers. Whole-wheat flour tortillas. Meats and other proteins Skinless chicken or [place]. Ground chicken or [place]. Pork with fat trimmed off. Fish and seafood. Egg whites. Dried beans, peas, or lentils. Unsalted nuts, nut butters, and seeds. Unsalted canned beans. Lean cuts of beef with fat trimmed off. Low-sodium, lean precooked or cured meat, such as sausages or meatloaves. Dairy Low-fat (1%) or fat-free (skim) milk. Reduced-fat, low-fat, or fat-free cheeses. Nonfat, low-sodium ricotta or cottage cheese. Low-fat or nonfatyogurt. Low-fat, low-sodium cheese. Fats and oils Soft margarine without trans fats. Vegetable oil. Reduced-fat, low-fat, or light mayonnaise and salad dressings (reduced-sodium). Canola, safflower, olive, avocado, soybean, andsunflower oils. Avocado. Seasonings and condiments Herbs. Spices. Seasoning mixes without salt. Other foods Unsalted popcorn and pretzels. Fat-free sweets. The items listed above may not be a complete list of foods and beverages you can eat. Contact a dietitian for more information. What foods should I avoid? Fruits Canned fruit in a light or heavy syrup. Fried fruit. Fruit in cream or buttersauce. Vegetables Creamed or fried vegetables. Vegetables in a cheese sauce. Regular canned vegetables (not low-sodium or reduced-sodium). Regular canned tomato sauce and paste (not low-sodium or reduced-sodium). Regular tomato and vegetable juice(not low-sodium or reduced-sodium). Angie Fava. Olives. Grains Baked goods made with fat, such as croissants, muffins, or some breads. Drypasta or rice meal packs. Meats and other proteins Fatty cuts of meat. Ribs. Fried meat. Berniece Salines. Bologna, salami, and other precooked or cured meats, such as sausages or meat loaves. Fat from the back of a pig (fatback). Bratwurst.  Salted nuts and seeds. Canned beans with added salt. Canned orsmoked fish. Whole eggs or egg yolks. Chicken or [place] with skin. Dairy Whole or 2% milk, cream, and half-and-half. Whole or full-fat cream cheese. Whole-fat or sweetened yogurt. Full-fat cheese. Nondairy creamers. Whippedtoppings. Processed cheese and cheese spreads. Fats and oils Butter. Stick margarine. Lard. Shortening. Ghee. Bacon fat. Tropical oils, suchas coconut, palm kernel, or palm oil. Seasonings and condiments Onion salt, garlic salt, seasoned salt, table salt, and sea salt. Worcestershire sauce. Tartar sauce. Barbecue sauce. Teriyaki sauce. Soy sauce, including reduced-sodium. Steak sauce. Canned and packaged gravies. Fish sauce. Oyster sauce. Cocktail sauce. Store-bought horseradish. Ketchup. Mustard. Meat flavorings and tenderizers. Bouillon cubes. Hot sauces. Pre-made or packaged marinades. Pre-made or packaged taco seasonings. Relishes. Regular saladdressings. Other foods Salted popcorn and pretzels. The items listed above may not be a complete list of foods and beverages you should avoid. Contact a dietitian for more information. Where to find more information National Heart, Lung, and Blood Institute: https://wilson-eaton.com/ American Heart Association: www.heart.org Academy of Nutrition and Dietetics: www.eatright.[place]: www.kidney.org Summary The DASH eating plan is a healthy eating plan that has been shown to reduce high blood pressure (hypertension). It may also reduce your risk for type 2 diabetes, heart disease, and stroke. When on the DASH eating plan, aim to eat more fresh fruits and vegetables, whole grains, lean proteins, low-fat dairy, and heart-healthy fats. With the DASH eating plan, you should limit salt (sodium) intake to  2,300 mg a day. If you have hypertension, you may need to reduce your sodium intake to 1,500 mg a day. Work with your health care provider or dietitian to adjust  your eating plan to your individual calorie needs. This information is not intended to replace advice given to you by your health care provider. Make sure you discuss any questions you have with your healthcare provider. Document Revised: 06/22/2019 Document Reviewed: 06/22/2019 Elsevier Patient Education  2022 [employer]   Preventive Care 11-8 Years Old, Male Preventive care refers to lifestyle choices and visits with your health care provider that can promote health and wellness. This includes: A yearly physical exam. This is also called an annual wellness visit. Regular dental and eye exams. Immunizations. Screening for certain conditions. Healthy lifestyle choices, such as: Eating a healthy diet. Getting regular exercise. Not using drugs or products that contain nicotine and tobacco. Limiting alcohol use. What can I expect for my preventive care visit? Physical exam Your health care provider will check your: Height and weight. These may be used to calculate your BMI (body mass index). BMI is a measurement that tells if you are at a healthy weight. Heart rate and blood pressure. Body temperature. Skin for abnormal spots. Counseling Your health care provider may ask you questions about your: Past medical problems. Family's medical history. Alcohol, tobacco, and drug use. Emotional well-being. Home life and relationship well-being. Sexual activity. Diet, exercise, and sleep habits. Work and work Statistician. Access to firearms. What immunizations do I need?  Vaccines are usually given at various ages, according to a schedule. Your health care provider will recommend vaccines for you based on your age, medicalhistory, and lifestyle or other factors, such as travel or where you work. What tests do I need? Blood tests Lipid and cholesterol levels. These may be checked every 5 years, or more often if you are over 1 years old. Hepatitis C test. Hepatitis B  test. Screening Lung cancer screening. You may have this screening every year starting at age 5 if you have a 30-pack-year history of smoking and currently smoke or have quit within the past 15 years. Prostate cancer screening. Recommendations will vary depending on your family history and other risks. Genital exam to check for testicular cancer or hernias. Colorectal cancer screening. All adults should have this screening starting at age 38 and continuing until age 71. Your health care provider may recommend screening at age 47 if you are at increased risk. You will have tests every 1-10 years, depending on your results and the type of screening test. Diabetes screening. This is done by checking your blood sugar (glucose) after you have not eaten for a while (fasting). You may have this done every 1-3 years. STD (sexually transmitted disease) testing, if you are at risk. Follow these instructions at home: Eating and drinking  Eat a diet that includes fresh fruits and vegetables, whole grains, lean protein, and low-fat dairy products. Take vitamin and mineral supplements as recommended by your health care provider. Do not drink alcohol if your health care provider tells you not to drink. If you drink alcohol: Limit how much you have to 0-2 drinks a day. Be aware of how much alcohol is in your drink. In the [place] one drink equals one 12 oz bottle of beer (355 mL), one 5 oz glass of wine (148 mL), or one 1 oz glass of hard liquor (44 mL).  Lifestyle Take daily care of your teeth and gums. Brush your  teeth every morning and night with fluoride toothpaste. Floss one time each day. Stay active. Exercise for at least 30 minutes 5 or more days each week. Do not use any products that contain nicotine or tobacco, such as cigarettes, e-cigarettes, and chewing tobacco. If you need help quitting, ask your health care provider. Do not use drugs. If you are sexually active, practice safe sex. Use a  condom or other form of protection to prevent STIs (sexually transmitted infections). If told by your health care provider, take low-dose aspirin daily starting at age 26. Find healthy ways to cope with stress, such as: Meditation, yoga, or listening to music. Journaling. Talking to a trusted person. Spending time with friends and family. Safety Always wear your seat belt while driving or riding in a vehicle. Do not drive: If you have been drinking alcohol. Do not ride with someone who has been drinking. When you are tired or distracted. While texting. Wear a helmet and other protective equipment during sports activities. If you have firearms in your house, make sure you follow all gun safety procedures. What's next? Go to your health care provider once a year for an annual wellness visit. Ask your health care provider how often you should have your eyes and teeth checked. Stay up to date on all vaccines. This information is not intended to replace advice given to you by your health care provider. Make sure you discuss any questions you have with your healthcare provider. Document Revised: 04/17/2019 Document Reviewed: 07/13/2018 Elsevier Patient Education  2022 [employer].

## 2021-02-05 NOTE — Progress Notes (Signed)
Patient: Curtis Underwood, Male    DOB: Feb 12, 1956, 65 y.o.   MRN: [id number] Delsa Grana, PA-C Visit Date: 02/05/2021  Today's Provider: Delsa Grana, PA-C   Chief Complaint  Patient presents with   Annual Exam   Subjective:   Annual physical exam:  Curtis Underwood is a 65 y.o. male who presents today for health maintenance and annual & complete physical exam.   Exercise/Activity:    5 d a week 30 min each time, tracking his exercising - walking miles  Diet/nutrition:   not working too much on diet, increased salads the last couple years Sleep:   CPAP/OSA - doing well now got mask adjusted  Hyperlipidemia: Currently treated with lipitor 80 zetia 10 pt reports good med compliance - added zetia in the past year due to LDL not at goal Last Lipids: Lab Results  Component Value Date   CHOL 170 01/31/2020   HDL 49 01/31/2020   LDLCALC 105 (H) 01/31/2020   TRIG 75 01/31/2020   CHOLHDL 3.5 01/31/2020   - Denies: Chest pain, shortness of breath, myalgias, claudication  Hypertension:  Blood pressure today is uncontrolled - he is monitoring at home and BP readings range 140-160's, mostly over 150 BP Readings from Last 3 Encounters:  02/05/21 (!) 152/86  07/03/20 140/86  05/27/20 136/88  Pt denies CP, SOB, exertional sx, LE edema, palpitation, Ha's, visual disturbances, lightheadedness, hypotension, syncope. Dietary efforts for BP?  none  Never been on BP meds before, wanting to work on loosing weight and work on diet, willing to try meds  USPSTF grade A and B recommendations - reviewed and addressed today  Depression:  Phq 9 completed today by patient, was reviewed by me with patient in the room, score is  negative, pt feels good PHQ 2/9 Scores 02/05/2021 07/03/2020 05/08/2020 01/31/2020  PHQ - 2 Score 0 0 0 0  PHQ- 9 Score 0 - - 0   Depression screen [facility] 2/9 02/05/2021 07/03/2020 05/08/2020 01/31/2020 05/03/2019  Decreased Interest 0 0 0 0 0  Down, Depressed, Hopeless 0 0 0 0 0  PHQ  - 2 Score 0 0 0 0 0  Altered sleeping 0 - - 0 0  Tired, decreased energy 0 - - 0 0  Change in appetite 0 - - 0 0  Feeling bad or failure about yourself  0 - - 0 0  Trouble concentrating 0 - - 0 0  Moving slowly or fidgety/restless 0 - - 0 0  Suicidal thoughts 0 - - 0 0  PHQ-9 Score 0 - - 0 0  Difficult doing work/chores Not difficult at all - - Not difficult at all Not difficult at all  Some recent data might be hidden    Hep C Screening: done STD testing and prevention (HIV/chl/gon/syphilis): done in the past Intimate partner violence:  safe, denies abuse   Prostate cancer:  Prostate cancer screening with PSA: Discussed risks and benefits of PSA testing and provided handout. Pt will to have PSA drawn today.  Lab Results  Component Value Date   PSA 1.2 01/31/2020   PSA 1.3 07/24/2018   PSA 2.3 11/16/2016    IPSS     Row Name 02/05/21 9562         International Prostate Symptom Score   How often have you had the sensation of not emptying your bladder? Not at All     How often have you had to urinate less than every two hours? Not at  All     How often have you found you stopped and started again several times when you urinated? Not at All     How often have you found it difficult to postpone urination? Not at All     How often have you had a weak urinary stream? Not at All     How often have you had to strain to start urination? Not at All     How many times did you typically get up at night to urinate? 1 Time     Total IPSS Score 1           Quality of Life due to urinary symptoms     If you were to spend the rest of your life with your urinary condition just the way it is now how would you feel about that? Delighted             Advanced Care Planning:  A voluntary discussion about advance care planning including the explanation and discussion of advance directives.  Discussed health care proxy and Living will, and the patient was able to identify a health care  proxy as wife MaryBeth.  Patient does not have a living will at present time. If patient does have living will, I have requested they bring this to the clinic to be scanned in to their chart.  Health Maintenance  Topic Date Due   Pneumococcal Vaccine 58-81 Years old (1 - PCV) Never done   Zoster Vaccines- Shingrix (1 of 2) Never done   COVID-19 Vaccine (3 - Booster for [employer] series) 03/31/2020   INFLUENZA VACCINE  03/02/2021   Fecal DNA (Cologuard)  02/18/2023   TETANUS/TDAP  07/24/2028   Hepatitis C Screening  Completed   HIV Screening  Completed   HPV VACCINES  Aged Out     Skin cancer:   Pt reports no hx of skin cancer, suspicious lesions/biopsies in the past. Right cheek bone area - feels like a sunburn - no lesion or patch - he will keep an eye on it  Colorectal cancer:  colonoscopy is UTD with cologuard Pt denies blood in stool, change in bowels, no concerns  Lung cancer:  Low Dose CT Chest recommended if Age 50-80 years, 20 pack-year currently smoking OR have quit w/in 15years. Patient does not qualify.   Social History   Tobacco Use   Smoking status: Some Days    Pack years: 0.00    Types: Cigars   Smokeless tobacco: Never  Substance Use Topics   Alcohol use: Yes    Alcohol/week: 0.0 standard drinks    Comment: occasionally     Alcohol screening: [place] Visit from 01/31/2020 in [facility]  AUDIT-C Score 4      AAA: not due until next year  The USPSTF recommends one-time screening with ultrasonography in men ages 73 to 68 years who have ever smoked  ECG:  reviewed last ECG 2019  Blood pressure/Hypertension: BP Readings from Last 3 Encounters:  02/05/21 (!) 154/84  07/03/20 140/86  05/27/20 136/88   Weight/Obesity: Wt Readings from Last 3 Encounters:  02/05/21 241 lb 3.2 oz (109.4 kg)  07/03/20 235 lb 12.8 oz (107 kg)  05/27/20 236 lb 3.2 oz (107.1 kg)   BMI Readings from Last 3 Encounters:  02/05/21 30.15 kg/m   07/03/20 29.47 kg/m  05/27/20 29.52 kg/m    Lipids:  Lab Results  Component Value Date   CHOL 170 01/31/2020   CHOL  183 05/03/2019   CHOL 187 10/26/2018   Lab Results  Component Value Date   HDL 49 01/31/2020   HDL 47 05/03/2019   HDL 42 10/26/2018   Lab Results  Component Value Date   LDLCALC 105 (H) 01/31/2020   LDLCALC 114 (H) 05/03/2019   LDLCALC 120 (H) 10/26/2018   Lab Results  Component Value Date   TRIG 75 01/31/2020   TRIG 109 05/03/2019   TRIG 137 10/26/2018   Lab Results  Component Value Date   CHOLHDL 3.5 01/31/2020   CHOLHDL 3.9 05/03/2019   CHOLHDL 4.5 10/26/2018   No results found for: LDLDIRECT Based on the results of lipid panel his/her cardiovascular risk factor ( using [facility] )  in the next 10 years is : The 10-year ASCVD risk score Mikey Bussing DC Brooke Bonito., et al., 2013) is: 20.2%   Values used to calculate the score:     Age: 83 years     Sex: Male     Is Non-Hispanic African American: No     Diabetic: No     Tobacco smoker: Yes     Systolic Blood Pressure: 833 mmHg     Is BP treated: No     HDL Cholesterol: 49 mg/dL     Total Cholesterol: 170 mg/dL Glucose:  Glucose, Bld  Date Value Ref Range Status  05/08/2020 88 65 - 99 mg/dL Final    Comment:    .            Fasting reference interval .   01/31/2020 101 (H) 65 - 99 mg/dL Final    Comment:    .            Fasting reference interval . For someone without known diabetes, a glucose value between 100 and 125 mg/dL is consistent with prediabetes and should be confirmed with a follow-up test. .   10/26/2018 89 65 - 99 mg/dL Final    Comment:    .            Fasting reference interval .    Social History      He  reports that he has been smoking cigars. He has never used smokeless tobacco. He reports current alcohol use. He reports that he does not use drugs.       Social History   Socioeconomic History   Marital status: Married    Spouse name: Designer, television/film set   Number of  children: 2   Years of education: 14   Highest education level: Some college, no degree  Occupational History   Not on file  Tobacco Use   Smoking status: Some Days    Pack years: 0.00    Types: Cigars   Smokeless tobacco: Never  Vaping Use   Vaping Use: Never used  Substance and Sexual Activity   Alcohol use: Yes    Alcohol/week: 0.0 standard drinks    Comment: occasionally   Drug use: No   Sexual activity: Yes    Partners: Female  Other Topics Concern   Not on file  Social History Narrative   Not on file   Social Determinants of Health   Financial Resource Strain: Low Risk    Difficulty of Paying Living Expenses: Not hard at all  Food Insecurity: No Food Insecurity   Worried About Charity fundraiser in the Last Year: Never true   [place] in the Last Year: Never true  Transportation Needs: No Transportation Needs   Lack  of Transportation (Medical): No   Lack of Transportation (Non-Medical): No  Physical Activity: Sufficiently Active   Days of Exercise per Week: 5 days   Minutes of Exercise per Session: 30 min  Stress: No Stress Concern Present   Feeling of Stress : Only a little  Social Connections: Unknown   Frequency of Communication with Friends and Family: More than three times a week   Frequency of Social Gatherings with Friends and Family: Three times a week   Attends Religious Services: Patient refused   Active Member of Clubs or Organizations: No   Attends Archivist Meetings: Never   Marital Status: Married    Family History        Family Status  Relation Name Status   Mother  Other   Father  Other        His He was adopted. Family history is unknown by patient.       Family History  Adopted: Yes  Family history unknown: Yes    Patient Active Problem List   Diagnosis Date Noted   Healthcare maintenance 05/27/2020   Elevated BP without diagnosis of hypertension 05/08/2020   Flank pain 05/08/2020   Vitamin D insufficiency  01/31/2020   Lesion of lip 05/03/2019   Overweight (BMI 25.0-29.9) 07/24/2018   History of transient ischemic attack (TIA) 04/14/2018   Gastroesophageal reflux disease without esophagitis 05/26/2015   Seasonal allergic rhinitis 05/26/2015   Neck pain 05/26/2015   Dyslipidemia 05/26/2015   Smoker 05/26/2015   OSA (obstructive sleep apnea) 05/26/2015   PND (post-nasal drip) 05/26/2015    Past Surgical History:  Procedure Laterality Date   APPENDECTOMY      Current Outpatient Medications:    aspirin 81 MG EC tablet, TAKE 1 TABLET BY MOUTH EVERY DAY, Disp: 90 tablet, Rfl: 1   atorvastatin (LIPITOR) 80 MG tablet, TAKE 1 TABLET BY MOUTH EVERY DAY, Disp: 90 tablet, Rfl: 1   azelastine (ASTELIN) 0.1 % nasal spray, Place 2 sprays into both nostrils 2 (two) times daily. Use in each nostril as directed, Disp: 30 mL, Rfl: 12   cetirizine (ZYRTEC) 10 MG tablet, Take 10 mg by mouth as needed for allergies., Disp: , Rfl:    ezetimibe (ZETIA) 10 MG tablet, Take 1 tablet (10 mg total) by mouth daily., Disp: 90 tablet, Rfl: 3   fluticasone (FLONASE) 50 MCG/ACT nasal spray, PLACE 2 SPRAYS INTO EACH NOSTRIL EVERY DAY, Disp: 48 mL, Rfl: 6   omeprazole (PRILOSEC) 20 MG capsule, TAKE 1 CAPSULE BY MOUTH DAILY AS NEEDED, Disp: 90 capsule, Rfl: 1   tiZANidine (ZANAFLEX) 4 MG tablet, TAKE 1 TABLET (4 MG TOTAL) BY MOUTH DAILY AS NEEDED FOR MUSCLE SPASMS. AS NEEDED FOR MUSCLE SPASMS, Disp: 90 tablet, Rfl: 0  No Known Allergies  Patient Care Team: Delsa Grana, PA-C as PCP - General (Family Medicine)   Chart Review: I personally reviewed active problem list, medication list, allergies, family history, social history, health maintenance, notes from last encounter, lab results, imaging with the patient/caregiver today.   Review of Systems  Constitutional: Negative.  Negative for activity change, appetite change, fatigue and unexpected weight change.  HENT: Negative.    Eyes: Negative.   Respiratory:  Negative.  Negative for shortness of breath.   Cardiovascular: Negative.  Negative for chest pain, palpitations and leg swelling.  Gastrointestinal: Negative.  Negative for abdominal pain and blood in stool.  Endocrine: Negative.   Genitourinary: Negative.  Negative for decreased urine volume, difficulty urinating, testicular  pain and urgency.  Musculoskeletal: Negative.   Skin: Negative.  Negative for color change and pallor.  Allergic/Immunologic: Negative.   Neurological: Negative.  Negative for syncope, weakness, light-headedness and numbness.  Hematological: Negative.   Psychiatric/Behavioral: Negative.  Negative for confusion, dysphoric mood, self-injury and suicidal ideas. The patient is not nervous/anxious.   All other systems reviewed and are negative.        Objective:   Vitals:  Vitals:   02/05/21 0949  BP: (!) 154/84  Pulse: 72  Resp: 16  Temp: 98.2 F (36.8 C)  SpO2: 95%  Weight: 241 lb 3.2 oz (109.4 kg)  Height: 6\' 3"  (1.905 m)    Body mass index is 30.15 kg/m.  Physical Exam Constitutional:      General: He is not in acute distress.    Appearance: Normal appearance. He is well-developed, well-groomed and overweight. He is not ill-appearing or toxic-appearing.     Interventions: Face mask in place.  HENT:     Head: Normocephalic and atraumatic.     Jaw: No trismus.     Right Ear: Tympanic membrane, ear canal and external ear normal. There is no impacted cerumen.     Left Ear: Tympanic membrane, ear canal and external ear normal. There is no impacted cerumen.     Nose: Congestion present. No mucosal edema or rhinorrhea.     Right Sinus: No maxillary sinus tenderness or frontal sinus tenderness.     Left Sinus: No maxillary sinus tenderness or frontal sinus tenderness.     Mouth/Throat:     Mouth: Mucous membranes are moist.     Pharynx: Oropharynx is clear. Uvula midline. No oropharyngeal exudate, posterior oropharyngeal erythema or uvula swelling.  Eyes:      General: Lids are normal. No scleral icterus.       Right eye: No discharge.        Left eye: No discharge.     Conjunctiva/sclera: Conjunctivae normal.     Pupils: Pupils are equal, round, and reactive to light.  Neck:     Thyroid: No thyroid mass, thyromegaly or thyroid tenderness.     Trachea: Trachea and phonation normal. No tracheal deviation.  Cardiovascular:     Rate and Rhythm: Normal rate and regular rhythm.     Pulses: Normal pulses.          Radial pulses are 2+ on the right side and 2+ on the left side.       Posterior tibial pulses are 2+ on the right side and 2+ on the left side.     Heart sounds: Normal heart sounds. No murmur heard.   No friction rub. No gallop.  Pulmonary:     Effort: Pulmonary effort is normal. No respiratory distress.     Breath sounds: Normal breath sounds. No stridor. No wheezing, rhonchi or rales.  Abdominal:     General: Bowel sounds are normal. There is no distension.     Palpations: Abdomen is soft.     Tenderness: There is no abdominal tenderness. There is no right CVA tenderness, left CVA tenderness, guarding or rebound.  Musculoskeletal:     Cervical back: Normal range of motion and neck supple. No rigidity.     Right lower leg: No edema.     Left lower leg: No edema.  Lymphadenopathy:     Cervical: No cervical adenopathy.  Skin:    General: Skin is warm and dry.     Capillary Refill: Capillary refill takes less than  2 seconds.     Coloration: Skin is not jaundiced or pale.     Findings: No lesion or rash.  Neurological:     General: No focal deficit present.     Mental Status: He is alert.     Coordination: Coordination normal.     Gait: Gait normal.  Psychiatric:        Mood and Affect: Mood normal.        Speech: Speech normal.        Behavior: Behavior normal. Behavior is cooperative.     No results found for this or any previous visit (from the past 2160 hour(s)).  Fall Risk: Fall Risk  02/05/2021 07/03/2020 05/08/2020  01/31/2020 05/03/2019  Falls in the past year? 0 0 0 0 0  Number falls in past yr: 0 0 0 0 0  Injury with Fall? 0 0 0 0 0  Follow up - - - - -    Functional Status Survey: Is the patient deaf or have difficulty hearing?: No Does the patient have difficulty seeing, even when wearing glasses/contacts?: Yes Does the patient have difficulty concentrating, remembering, or making decisions?: No Does the patient have difficulty walking or climbing stairs?: No Does the patient have difficulty dressing or bathing?: No Does the patient have difficulty doing errands alone such as visiting a doctor's office or shopping?: No   Assessment & Plan:    CPE completed today  Prostate cancer screening and PSA options (with potential risks and benefits of testing vs not testing) were discussed along with recent recs/guidelines, shared decision making and handout/information given to pt today  USPSTF grade A and B recommendations reviewed with patient; age-appropriate recommendations, preventive care, screening tests, etc discussed and encouraged; healthy living encouraged; see AVS for patient education given to patient  Discussed importance of 150 minutes of physical activity weekly, AHA exercise recommendations given to pt in AVS/handout  Discussed importance of healthy diet:  eating lean meats and proteins, avoiding trans fats and saturated fats, avoid simple sugars and excessive carbs in diet, eat 6 servings of fruit/vegetables daily and drink plenty of water and avoid sweet beverages.  DASH diet reviewed if pt has HTN  Recommended pt to do annual eye exam and routine dental exams/cleanings  Reviewed Health Maintenance: Declines pneumococcal today Shingrix discussed - sent to pharmacy  Health Maintenance  Topic Date Due   Pneumococcal Vaccine 63-48 Years old (1 - PCV) Never done   Zoster Vaccines- Shingrix (1 of 2) Never done   COVID-19 Vaccine (3 - Booster for [employer] series) 03/31/2020   INFLUENZA  VACCINE  03/02/2021   Fecal DNA (Cologuard)  02/18/2023   TETANUS/TDAP  07/24/2028   Hepatitis C Screening  Completed   HIV Screening  Completed   HPV VACCINES  Aged Out    Immunizations: Immunization History  Administered Date(s) Administered   Influenza,inj,Quad PF,6+ Mos 05/26/2015, 04/14/2018, 04/05/2019, 05/08/2020   PFIZER(Purple Top)SARS-COV-2 Vaccination 10/08/2019, 10/30/2019   Pneumococcal Polysaccharide-23 05/27/2020   Tdap 07/24/2018     ICD-10-CM   1. Annual physical exam  Z00.00 Lipid panel    COMPLETE METABOLIC PANEL WITH GFR    CBC w/Diff/Platelet    PSA    2. Dyslipidemia  E78.5 atorvastatin (LIPITOR) 80 MG tablet    Lipid panel    COMPLETE METABOLIC PANEL WITH GFR   LDL has not been at goal, added zetia last year- will need to change statin if still not improved, discussed with pt, tolerating statin well  3. Obstructive sleep apnea  B34.19 COMPLETE METABOLIC PANEL WITH GFR    CBC w/Diff/Platelet   compliant with CPAP, was having trouble with mask, encouraged continued use    4. Class 1 obesity with serious comorbidity and body mass index (BMI) of 30.0 to 30.9 in adult, unspecified obesity type  E66.9 Lipid panel   F79.02 COMPLETE METABOLIC PANEL WITH GFR    CBC w/Diff/Platelet   with HTN and HLD, weight up, he is working on loosing weight and exercising more    5. Screening for prostate cancer  Z12.5 PSA   no LUTS, labs done in the past, discussed PSA lab, shared decision making today - he will have done    6. Primary hypertension  I09 COMPLETE METABOLIC PANEL WITH GFR    lisinopril (ZESTRIL) 10 MG tablet   BP high today, repeated and remained high, similar to home readings, start lisinopril 10 mg, 3-4 week bp recheck, increase dose to 20 if needed, DASH    7. Seasonal allergic rhinitis, unspecified trigger  J30.2 montelukast (SINGULAIR) 10 MG tablet   add singulair to antihistamine and intranasal steroid    8. Need for shingles vaccine  Z23 Zoster  Vaccine Adjuvanted [facility] I) injection       Delsa Grana, PA-C 02/05/21 10:08 AM  [facility]

## 2021-02-26 ENCOUNTER — Ambulatory Visit: Payer: Managed Care, Other (non HMO)

## 2021-05-11 ENCOUNTER — Other Ambulatory Visit: Payer: Self-pay | Admitting: Family Medicine

## 2021-05-11 NOTE — Telephone Encounter (Signed)
Refill on old muscle relaxers from Dr. Lemmie Evens 07/2020 Will need to address/discuss at next OV prior to any further refills, not documented yet this year and not previously discussed with me when I saw pt for CPE

## 2021-05-25 ENCOUNTER — Other Ambulatory Visit: Payer: Self-pay | Admitting: Family Medicine

## 2021-05-28 ENCOUNTER — Encounter: Payer: Self-pay | Admitting: Family Medicine

## 2021-05-29 NOTE — Progress Notes (Signed)
Virtual Visit via Video Note  I connected with Curtis Underwood on 05/29/21 at 11:40 AM EDT by a video enabled telemedicine application and verified that I am speaking with the correct person using two identifiers.  Location: Patient: home Provider: [facility]   I discussed the limitations of evaluation and management by telemedicine and the availability of in person appointments. The patient expressed understanding and agreed to proceed.  History of Present Illness:  Curtis Underwood is a 65 year old male presenting via telemedicine for URI symptoms. Chronic medical conditions include OSA, GERD, HLD.   URI Compliant:  -Worst symptom: nasal congestion. Had an abscessed tooth 6 weeks ago, took Amoxicillin BID for about 20 days, then noticed facial swelling and nasal congestion after that, about 4 weeks ago. He has had the tooth pulled. -Fever: no -Cough: no -Shortness of breath: no -Wheezing: no -Chest congestion: no -Nasal congestion: yes -Runny nose: yes, some yellow nasal discharge first in the morning then clear throughout the day -Post nasal drip: no -Sneezing: yes -Sore throat: no -Sinus pressure: yes -Headache: yes -Face pain: yes -Toothache: no -Ear pain: yes left -Ear pressure: no  -Context: worse -Treatments attempted: cold/sinus and antibiotics Has taken OTC cold and flu medication without relief, he is using Afrin 2-3 times a day for the last several weeks. Has Flonase but hasn't tried it. Is occasionally using nasal saline. Takes Singulair and Zyrtec for seasonal allergies but hasn't been taking lately.   Observations/Objective:  General: well nourished, well developed, in no acute distress with non-toxic appearance HEENT: normocephalic, atraumatic, moist mucous membranes, congested sounding Neuro: Alert and oriented, speech normal  Assessment and Plan:  1. Nasal congestion due to prolonged use of decongestants: Rebound nasal congestion due to Afrin. We discussed  completely stopping the Afrin, switching to nasal saline and Flonase twice daily. I will send a Medrol Dosepak to his pharmacy to help decrease swelling. He can restart allergy medications. He will follow up if symptoms worsen or fail to improve.  - methylPREDNISolone (MEDROL DOSEPAK) 4 MG TBPK tablet; Day 1: Take 8 mg (2 tablets) before breakfast, 4 mg (1 tablet) after lunch, 4 mg (1 tablet) after supper, and 8 mg (2 tablets) at bedtime. Day 2:Take 4 mg (1 tablet) before breakfast, 4 mg (1 tablet) after lunch, 4 mg (1 tablet) after supper, and 8 mg (2 tablets) at bedtime. Day 3: Take 4 mg (1 tablet) before breakfast, 4 mg (1 tablet) after lunch, 4 mg (1 tablet) after supper, and 4 mg (1 tablet) at bedtime. Day 4: Take 4 mg (1 tablet) before breakfast, 4 mg (1 tablet) after lunch, and 4 mg (1 tablet) at bedtime. Day 5: Take 4 mg (1 tablet) before breakfast and 4 mg (1 tablet) at bedtime. Day 6: Take 4 mg (1 tablet) before breakfast.  Dispense: 1 each; Refill: 0  2. History of abscessed tooth: Resolved.    Follow Up Instructions:    I discussed the assessment and treatment plan with the patient. The patient was provided an opportunity to ask questions and all were answered. The patient agreed with the plan and demonstrated an understanding of the instructions.   The patient was advised to call back or seek an in-person evaluation if the symptoms worsen or if the condition fails to improve as anticipated.  I provided 15 minutes of non-face-to-face time during this encounter.   Teodora Medici, DO

## 2021-06-01 ENCOUNTER — Encounter: Payer: Self-pay | Admitting: Internal Medicine

## 2021-06-01 ENCOUNTER — Telehealth (INDEPENDENT_AMBULATORY_CARE_PROVIDER_SITE_OTHER): Payer: Managed Care, Other (non HMO) | Admitting: Internal Medicine

## 2021-06-01 VITALS — Ht 75.0 in | Wt 241.0 lb

## 2021-06-01 DIAGNOSIS — Z8719 Personal history of other diseases of the digestive system: Secondary | ICD-10-CM

## 2021-06-01 DIAGNOSIS — T485X5A Adverse effect of other anti-common-cold drugs, initial encounter: Secondary | ICD-10-CM

## 2021-06-01 DIAGNOSIS — R0981 Nasal congestion: Secondary | ICD-10-CM

## 2021-06-01 MED ORDER — METHYLPREDNISOLONE 4 MG PO TBPK: ORAL_TABLET | ORAL | 0 refills | Status: DC

## 2021-06-01 NOTE — Patient Instructions (Signed)
It was great seeing you today!  Plan discussed at today's visit: -STOP AFRIN, this is causing rebound nasal congestion  -Instead, use nasal saline everyday then Flonase (nasal steroid) 2 sprays in each nostril twice a day along with Medrol dosepack to help decrease inflammation. -Ok to use Zyrtec and other allergy medications  Follow up in: if symptoms worsen or fail to improve  Take care and let us know if you have any questions or concerns prior to your next visit.  Dr. Rosana Berger

## 2021-08-18 IMAGING — US US RENAL
1 series · 14 of 25 positions shown · non-contrast
Comparison: None.

CLINICAL DATA: 64-year-old male with flank pain x1 month.

EXAM:
RENAL / URINARY TRACT ULTRASOUND COMPLETE

[Series 1: us renal · 0.26mm/px · 14 of 26 slices shown]
[im 1/26]
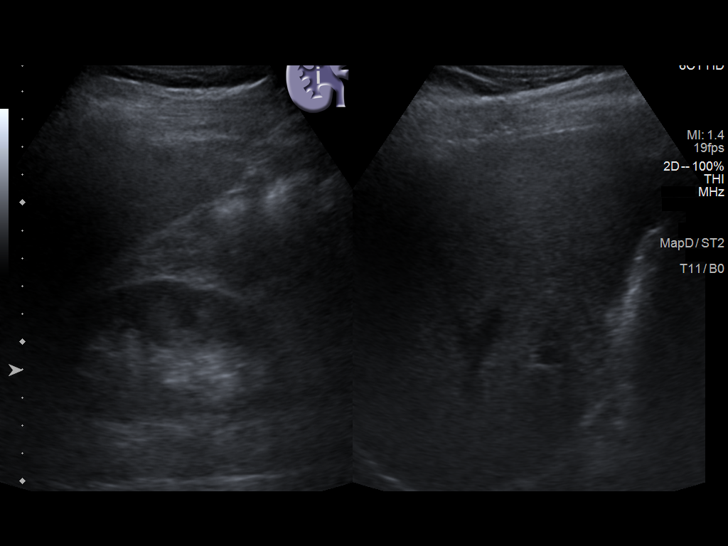
[im 3/26]
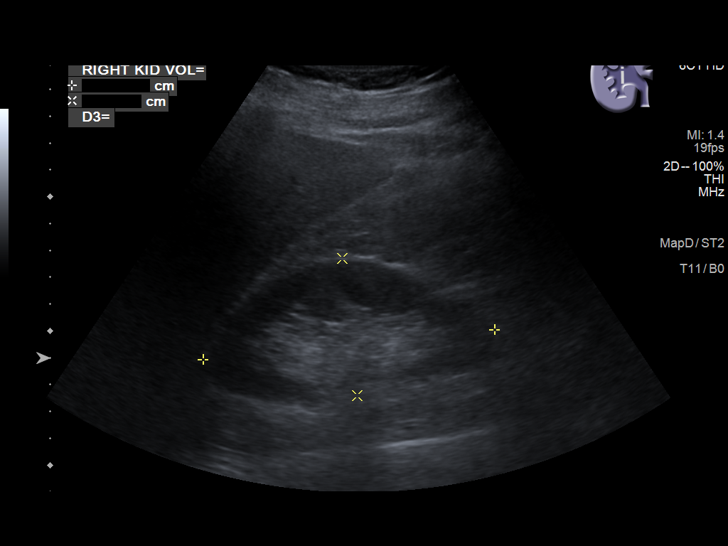
[im 5/26]
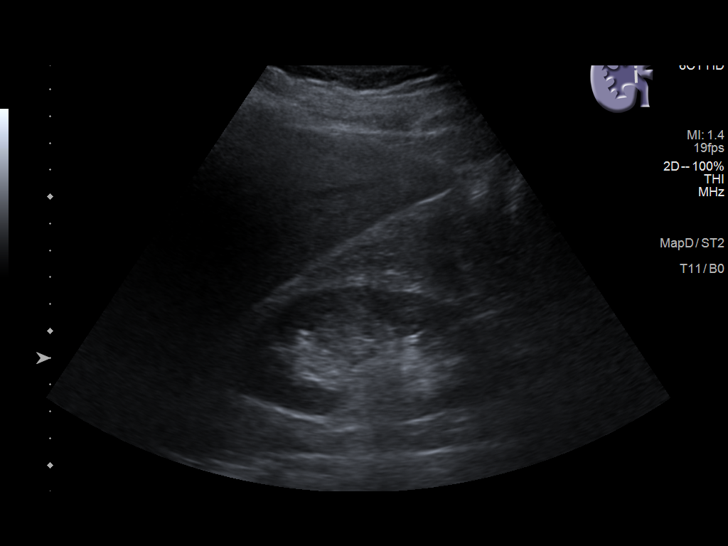
[im 7/26]
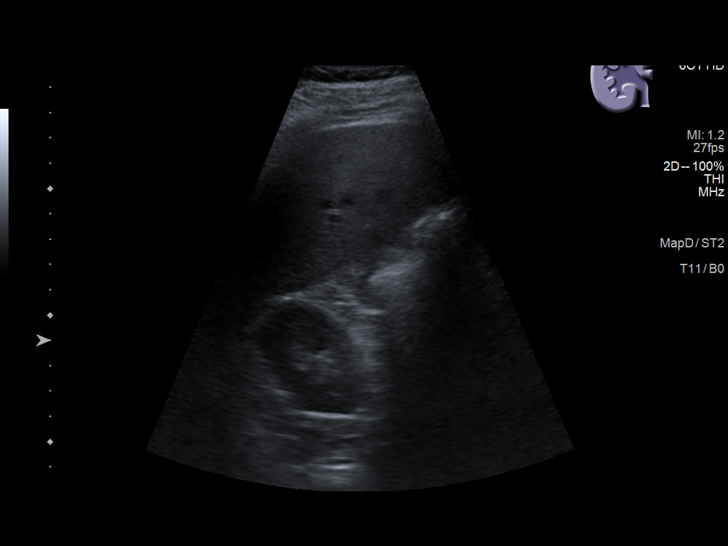
[im 9/26]
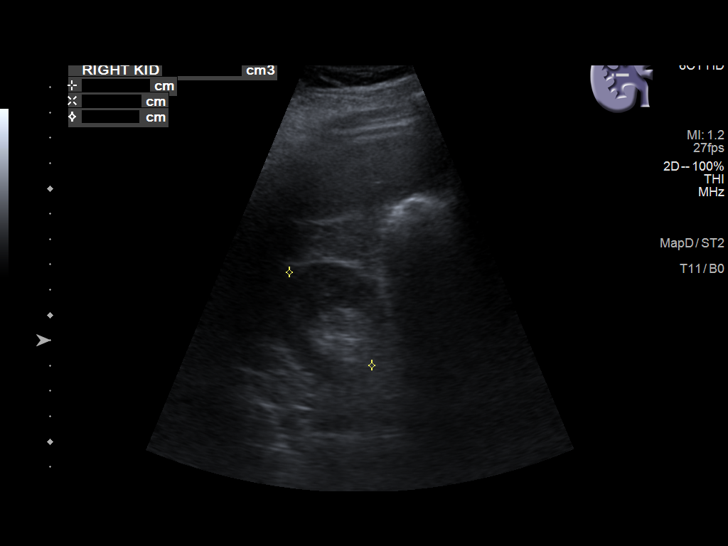
[im 10/26]
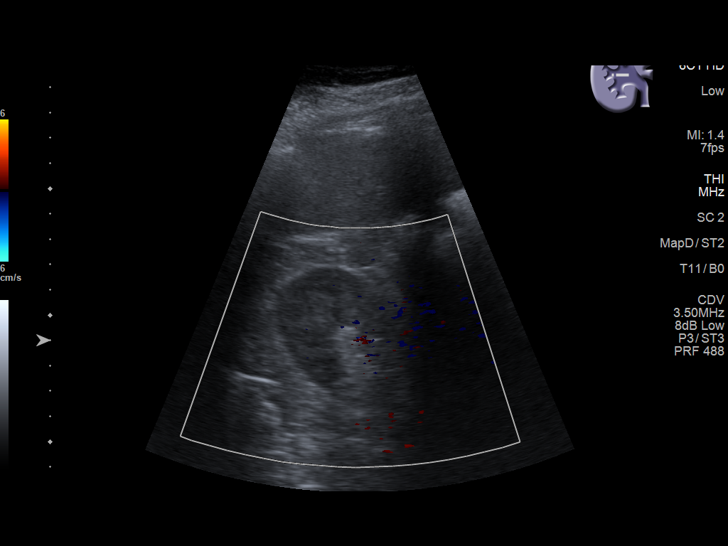
[im 12/26]
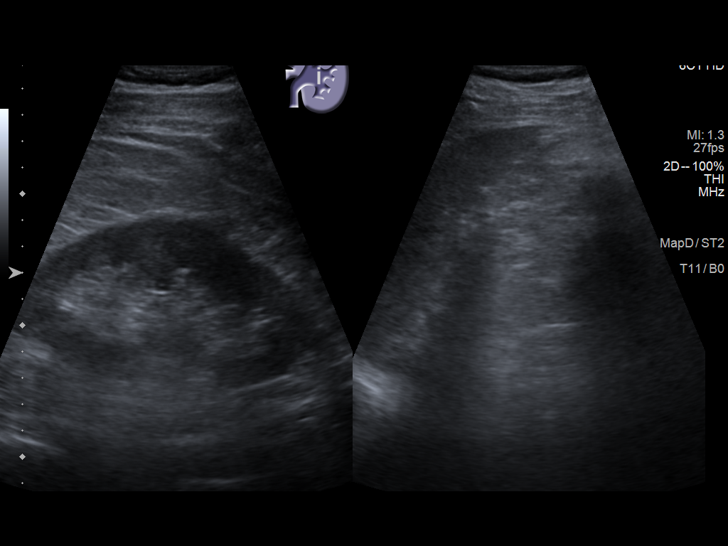
[im 14/26]
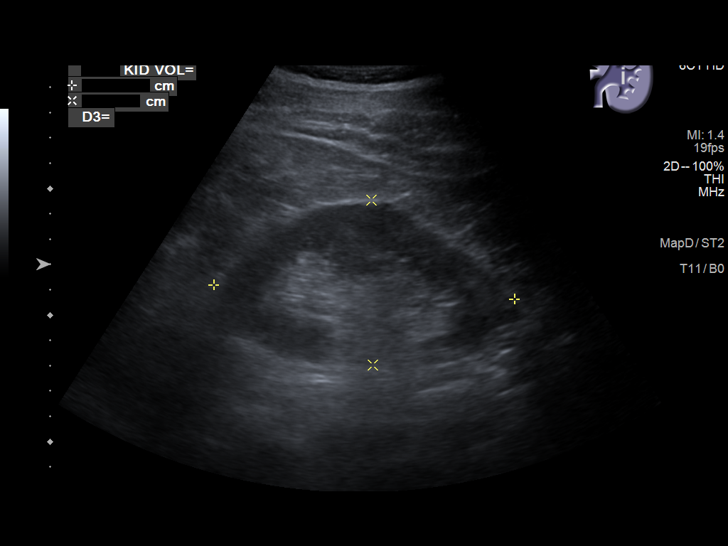
[im 16/26]
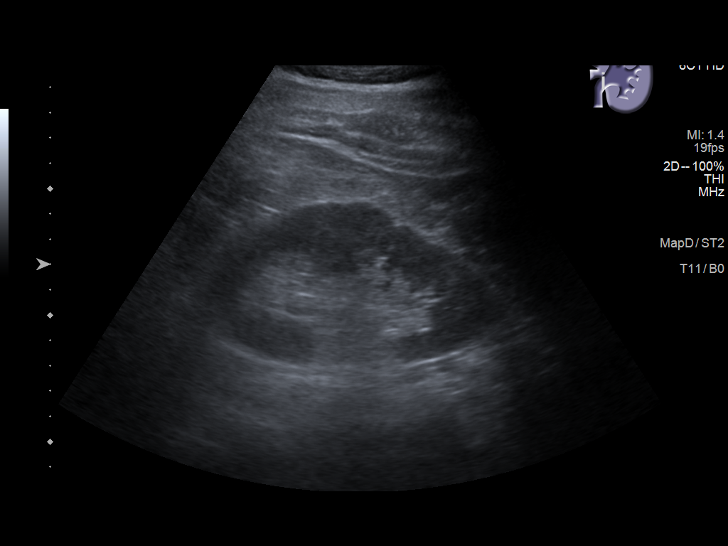
[im 17/26]
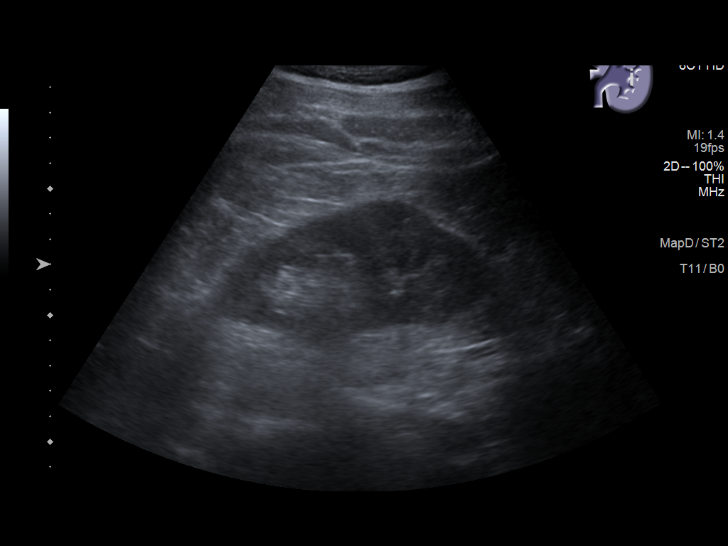
[im 19/26]
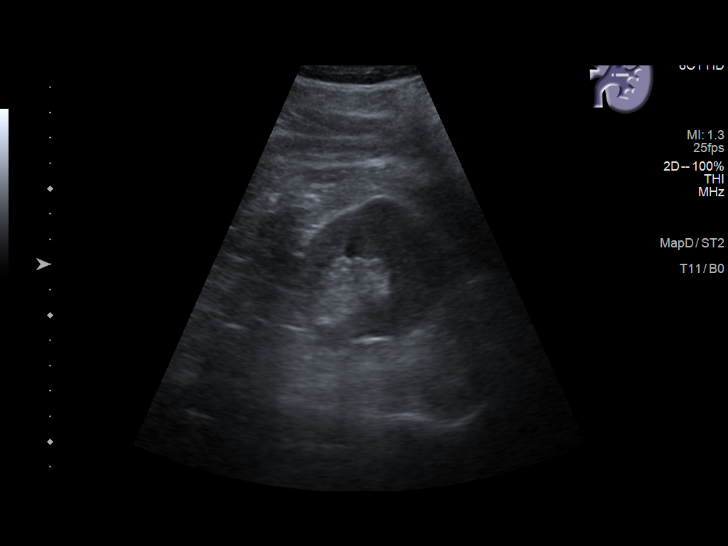
[im 21/26]
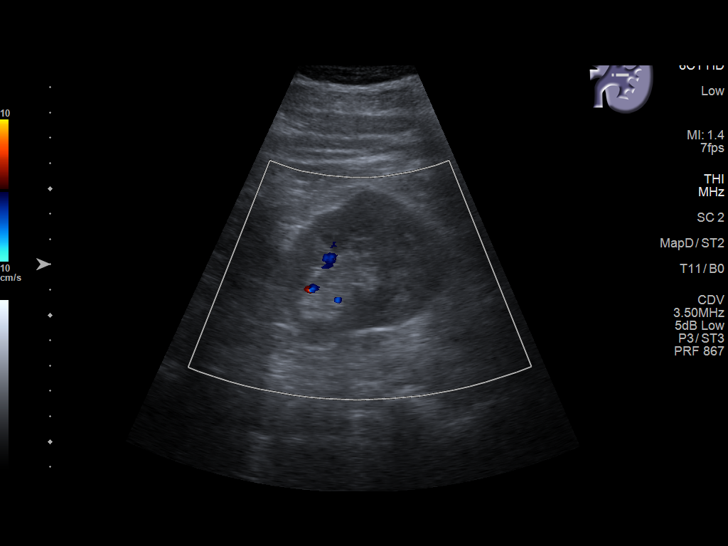
[im 23/26]
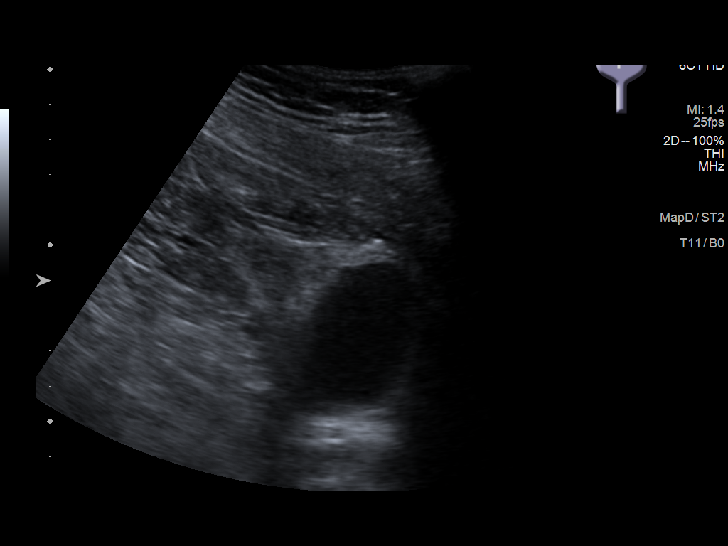
[im 26/26]
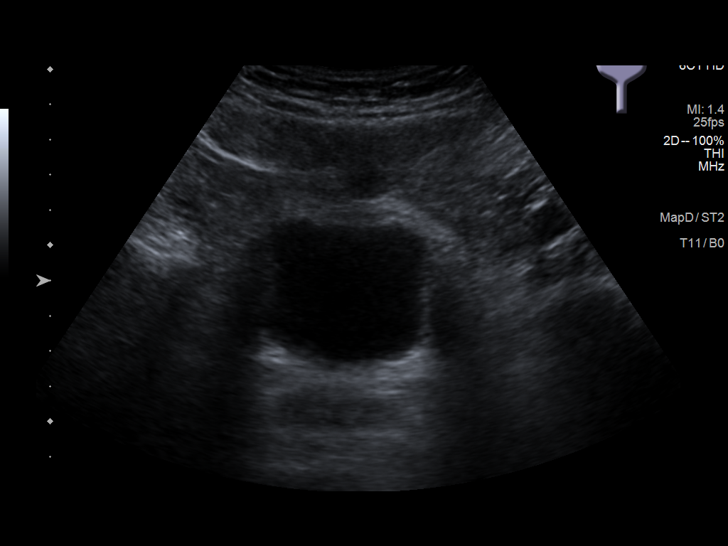

[14 of 25 positions shown; findings below may reference images not displayed]

FINDINGS: Right Kidney:

Renal measurements: 10.9 x 5.1 x 4.9 cm = volume: 144 mL.
Echogenicity within normal limits. No mass or hydronephrosis
visualized.

Left Kidney:

Renal measurements: 11.9 x 6.5 x 5.5 cm = volume: 224 mL.
Echogenicity within normal limits. No mass or hydronephrosis
visualized.

Bladder:

Appears normal for degree of bladder distention.

Other:

Fatty infiltration of the liver.
IMPRESSION: 1. Unremarkable kidneys and urinary bladder.
2. Fatty liver.

## 2021-08-24 ENCOUNTER — Ambulatory Visit: Payer: Self-pay | Admitting: Physician Assistant

## 2022-01-21 ENCOUNTER — Encounter: Payer: Self-pay | Admitting: Family Medicine

## 2022-01-21 DIAGNOSIS — I10 Essential (primary) hypertension: Secondary | ICD-10-CM | POA: Insufficient documentation

## 2022-01-21 NOTE — Assessment & Plan Note (Signed)
On high intensity statin and zetia Goal <70 Lab Results  Component Value Date   CHOL 156 02/05/2021   HDL 54 02/05/2021   LDLCALC 85 02/05/2021   TRIG 80 02/05/2021   CHOLHDL 2.9 02/05/2021   not a goal last year Due for labs

## 2022-01-21 NOTE — Progress Notes (Signed)
Name: Curtis Underwood   MRN: [id number]    DOB: Dec 27, 1955   Date:01/22/2022       Progress Note  Chief Complaint  Patient presents with   Follow-up   Hypertension   Hyperlipidemia   Gastroesophageal Reflux     Subjective:   Curtis MAXIM is a 66 y.o. male, presents to clinic for routine f/up  Hypertension:  Currently managed on lisinopril 10 - started taking last year after CPE when BP elevated, no f/up since then Pt reports good med compliance and denies any SE.   Blood pressure today is well controlled. BP Readings from Last 3 Encounters:  01/22/22 140/76  02/05/21 (!) 152/86  07/03/20 140/86   Pt denies CP, SOB, exertional sx, LE edema, palpitation, Ha's, visual disturbances, lightheadedness, hypotension, syncope. Dietary efforts for BP?  Working on loosing weight and diet  Hyperlipidemia: Currently treated with lipitor 80 and zetia, pt reports good med compliance Last Lipids: Lab Results  Component Value Date   CHOL 156 02/05/2021   HDL 54 02/05/2021   LDLCALC 85 02/05/2021   TRIG 80 02/05/2021   CHOLHDL 2.9 02/05/2021   - Denies: Chest pain, shortness of breath, myalgias, claudication  Hx of TIA and current smoker Prior neuro consults? no Cardiology consult/work up? no  Rhinosinusitis and allergies, has frequent sinus infections On flonase, zyrtec, astelin spray, singulair - sx stable  Prior Vit D deficiency - mild  Last vitamin D Lab Results  Component Value Date   VD25OH 27 (L) 01/31/2020   Right hip low back and radiation leg pain feels like nerve pain shooting to calf, has flaired up and calmed down, right now the hip feels better but he'll still have twinges in the calf   He also has some skin lesions - previously went to derm but he doesn't know or remember where - spots on back - several and right forearm   Current Outpatient Medications:    cetirizine (ZYRTEC) 10 MG tablet, Take 10 mg by mouth as needed for allergies., Disp: , Rfl:     fluticasone (FLONASE) 50 MCG/ACT nasal spray, PLACE 2 SPRAYS INTO EACH NOSTRIL EVERY DAY, Disp: 48 mL, Rfl: 6   omeprazole (PRILOSEC) 20 MG capsule, TAKE 1 CAPSULE BY MOUTH DAILY AS NEEDED, Disp: 90 capsule, Rfl: 1   Zoster Vaccine Adjuvanted (SHINGRIX) injection, Inject 0.5 mLs into the muscle once for 1 dose., Disp: 0.5 mL, Rfl: 0   aspirin 81 MG EC tablet, TAKE 1 TABLET BY MOUTH EVERY DAY (Patient not taking: Reported on 01/22/2022), Disp: 90 tablet, Rfl: 1   atorvastatin (LIPITOR) 80 MG tablet, Take 1 tablet (80 mg total) by mouth daily., Disp: 90 tablet, Rfl: 3   azelastine (ASTELIN) 0.1 % nasal spray, Place 2 sprays into both nostrils 2 (two) times daily. Use in each nostril as directed, Disp: 30 mL, Rfl: 12   ezetimibe (ZETIA) 10 MG tablet, Take 1 tablet (10 mg total) by mouth daily., Disp: 90 tablet, Rfl: 3   lisinopril (ZESTRIL) 10 MG tablet, Take 1 tablet (10 mg total) by mouth daily., Disp: 90 tablet, Rfl: 3   montelukast (SINGULAIR) 10 MG tablet, Take 1 tablet (10 mg total) by mouth at bedtime., Disp: 90 tablet, Rfl: 3   tiZANidine (ZANAFLEX) 4 MG tablet, Take 1 tablet (4 mg total) by mouth daily as needed for muscle spasms. As needed for muscle spasms, Disp: 30 tablet, Rfl: 1  Patient Active Problem List   Diagnosis Date Noted  Primary hypertension 01/21/2022   Vitamin D insufficiency 01/31/2020   Overweight (BMI 25.0-29.9) 07/24/2018   History of transient ischemic attack (TIA) 04/14/2018   Gastroesophageal reflux disease without esophagitis 05/26/2015   Seasonal allergic rhinitis 05/26/2015   Dyslipidemia 05/26/2015   Smoker 05/26/2015   Obstructive sleep apnea 05/26/2015    Past Surgical History:  Procedure Laterality Date   APPENDECTOMY      Family History  Adopted: Yes  Family history unknown: Yes    Social History   Tobacco Use   Smoking status: Some Days    Types: Cigars   Smokeless tobacco: Never  Vaping Use   Vaping Use: Never used  Substance Use Topics    Alcohol use: Yes    Alcohol/week: 0.0 standard drinks of alcohol    Comment: occasionally   Drug use: No     No Known Allergies  Health Maintenance  Topic Date Due   Zoster Vaccines- Shingrix (1 of 2) Never done   Pneumonia Vaccine 88+ Years old (2 - PCV) 05/27/2021   COVID-19 Vaccine (3 - Pfizer series) 02/07/2022 (Originally 12/25/2019)   INFLUENZA VACCINE  03/02/2022   Fecal DNA (Cologuard)  02/18/2023   TETANUS/TDAP  07/24/2028   Hepatitis C Screening  Completed   HIV Screening  Completed   HPV VACCINES  Aged Out    Chart Review Today: I personally reviewed active problem list, medication list, allergies, family history, social history, health maintenance, notes from last encounter, lab results, imaging with the patient/caregiver today.   Review of Systems  Constitutional: Negative.   HENT: Negative.    Eyes: Negative.   Respiratory: Negative.    Cardiovascular: Negative.   Gastrointestinal: Negative.   Endocrine: Negative.   Genitourinary: Negative.   Musculoskeletal: Negative.   Skin: Negative.   Allergic/Immunologic: Negative.   Neurological: Negative.   Hematological: Negative.   Psychiatric/Behavioral: Negative.    All other systems reviewed and are negative.    Objective:   Vitals:   01/22/22 0822  BP: 140/76  Pulse: 65  Resp: 16  Temp: 97.7 F (36.5 C)  TempSrc: Oral  SpO2: 98%  Weight: 229 lb 11.2 oz (104.2 kg)  Height: 6\' 3"  (1.905 m)    Body mass index is 28.71 kg/m.  Physical Exam Vitals and nursing note reviewed.  Constitutional:      General: He is not in acute distress.    Appearance: Normal appearance. He is well-developed, well-groomed and overweight. He is not ill-appearing, toxic-appearing or diaphoretic.  HENT:     Head: Normocephalic and atraumatic.     Jaw: No trismus.     Right Ear: External ear normal.     Left Ear: External ear normal.     Nose: Nose normal.     Mouth/Throat:     Mouth: Mucous membranes are moist.      Pharynx: Oropharynx is clear.  Eyes:     General: Lids are normal. No scleral icterus.       Right eye: No discharge.        Left eye: No discharge.     Conjunctiva/sclera: Conjunctivae normal.     Pupils: Pupils are equal, round, and reactive to light.  Neck:     Trachea: Trachea and phonation normal. No tracheal deviation.  Cardiovascular:     Rate and Rhythm: Normal rate and regular rhythm.     Pulses: Normal pulses.          Radial pulses are 2+ on the right side and 2+  on the left side.       Posterior tibial pulses are 2+ on the right side and 2+ on the left side.     Heart sounds: Normal heart sounds. No murmur heard.    No friction rub. No gallop.  Pulmonary:     Effort: Pulmonary effort is normal. No respiratory distress.     Breath sounds: Normal breath sounds. No stridor. No wheezing, rhonchi or rales.  Abdominal:     General: Bowel sounds are normal. There is no distension.     Palpations: Abdomen is soft.     Tenderness: There is no abdominal tenderness.  Musculoskeletal:     Right lower leg: No edema.     Left lower leg: No edema.  Skin:    General: Skin is warm and dry.     Coloration: Skin is not jaundiced.     Findings: Lesion present. No rash.     Nails: There is no clubbing.     Comments: Several raised dry lesions to back, light brown color, oblong, roughly 12-62mm x 10 mm Small irregular shaped hyperpigmented lesion to right forearm, not raised but try appearing  Neurological:     Mental Status: He is alert. Mental status is at baseline.     Cranial Nerves: No dysarthria or facial asymmetry.     Motor: No tremor or abnormal muscle tone.     Gait: Gait normal.  Psychiatric:        Mood and Affect: Mood normal.        Speech: Speech normal.        Behavior: Behavior normal. Behavior is cooperative.         Assessment & Plan:   Problem List Items Addressed This Visit       Cardiovascular and Mediastinum   Primary hypertension    Well  controlled, compliant with meds, no SE or concerning sx Recheck renal function and electrolytes Pt encouraged to continue to work on healthy diet (low salt) and lifestyle for improving HTN management - DASH diet handout offered today Lisinopril 10 started last year       Relevant Medications   atorvastatin (LIPITOR) 80 MG tablet   ezetimibe (ZETIA) 10 MG tablet   lisinopril (ZESTRIL) 10 MG tablet     Respiratory   Seasonal allergic rhinitis   Relevant Medications   montelukast (SINGULAIR) 10 MG tablet   azelastine (ASTELIN) 0.1 % nasal spray   Obstructive sleep apnea    Good compliance with CPAP        Digestive   Gastroesophageal reflux disease without esophagitis    On prilosec Encouraged diet/lifestyle efforts, avoid ETOH, d/c smoking Wean off PPI, can use pepcid 20 mg BID PRN        Other   Dyslipidemia - Primary    On high intensity statin and zetia Goal <70 Lab Results  Component Value Date   CHOL 156 02/05/2021   HDL 54 02/05/2021   LDLCALC 85 02/05/2021   TRIG 80 02/05/2021   CHOLHDL 2.9 02/05/2021   not a goal last year Due for labs       Relevant Medications   atorvastatin (LIPITOR) 80 MG tablet   ezetimibe (ZETIA) 10 MG tablet   Other Relevant Orders   COMPLETE METABOLIC PANEL WITH GFR   Lipid panel   Smoker    Smoking cessation instruction/counseling given:  counseled patient on the dangers of tobacco use, advised patient to stop smoking, and reviewed strategies to maximize success  Offered resources/meds      History of transient ischemic attack (TIA)    BP and LDL should be optimized Smoking cessation urged      Vitamin D insufficiency    Prior Vit D deficiency - mild  Last vitamin D Lab Results  Component Value Date   VD25OH 45 (L) 01/31/2020  Manage with OTC supplements 1000-2000 IU qd      Other Visit Diagnoses     Elevated BP without diagnosis of hypertension       Relevant Orders   COMPLETE METABOLIC PANEL WITH GFR   Need  for pneumococcal vaccination       Relevant Orders   Pneumococcal conjugate vaccine 20-valent (Completed)   Need for shingles vaccine       Relevant Medications   Zoster Vaccine Adjuvanted [facility]) injection   Skin lesion of right arm       multiple dry flat light brown skin lesions to back, previously saw derm - will need to do f/up derm skin survey   Relevant Orders   Ambulatory referral to Dermatology        Return for 6 month routine. Needs welcome to medicare visit as well   Danelle Berry, PA-C 01/22/22 8:47 AM

## 2022-01-21 NOTE — Assessment & Plan Note (Signed)
BP and LDL should be optimized Smoking cessation urged

## 2022-01-21 NOTE — Assessment & Plan Note (Signed)
Good compliance with CPAP 

## 2022-01-21 NOTE — Assessment & Plan Note (Signed)
On prilosec Encouraged diet/lifestyle efforts, avoid ETOH, d/c smoking Wean off PPI, can use pepcid 20 mg BID PRN

## 2022-01-22 ENCOUNTER — Ambulatory Visit (INDEPENDENT_AMBULATORY_CARE_PROVIDER_SITE_OTHER): Payer: PPO | Admitting: Family Medicine

## 2022-01-22 ENCOUNTER — Encounter: Payer: Self-pay | Admitting: Family Medicine

## 2022-01-22 VITALS — BP 140/76 | HR 65 | Temp 97.7°F | Resp 16 | Ht 75.0 in | Wt 229.7 lb

## 2022-01-22 DIAGNOSIS — R030 Elevated blood-pressure reading, without diagnosis of hypertension: Secondary | ICD-10-CM | POA: Diagnosis not present

## 2022-01-22 DIAGNOSIS — L989 Disorder of the skin and subcutaneous tissue, unspecified: Secondary | ICD-10-CM

## 2022-01-22 DIAGNOSIS — G4733 Obstructive sleep apnea (adult) (pediatric): Secondary | ICD-10-CM

## 2022-01-22 DIAGNOSIS — I10 Essential (primary) hypertension: Secondary | ICD-10-CM

## 2022-01-22 DIAGNOSIS — E785 Hyperlipidemia, unspecified: Secondary | ICD-10-CM | POA: Diagnosis not present

## 2022-01-22 DIAGNOSIS — Z23 Encounter for immunization: Secondary | ICD-10-CM

## 2022-01-22 DIAGNOSIS — E559 Vitamin D deficiency, unspecified: Secondary | ICD-10-CM | POA: Diagnosis not present

## 2022-01-22 DIAGNOSIS — F17200 Nicotine dependence, unspecified, uncomplicated: Secondary | ICD-10-CM | POA: Diagnosis not present

## 2022-01-22 DIAGNOSIS — K219 Gastro-esophageal reflux disease without esophagitis: Secondary | ICD-10-CM | POA: Diagnosis not present

## 2022-01-22 DIAGNOSIS — J302 Other seasonal allergic rhinitis: Secondary | ICD-10-CM | POA: Diagnosis not present

## 2022-01-22 DIAGNOSIS — Z8673 Personal history of transient ischemic attack (TIA), and cerebral infarction without residual deficits: Secondary | ICD-10-CM

## 2022-01-22 MED ORDER — ATORVASTATIN CALCIUM 80 MG PO TABS: 80.0000 mg | ORAL_TABLET | Freq: Every day | ORAL | 3 refills | Status: DC

## 2022-01-22 MED ORDER — AZELASTINE HCL 0.1 % NA SOLN: 2.0000 | Freq: Two times a day (BID) | NASAL | 12 refills | Status: DC

## 2022-01-22 MED ORDER — SHINGRIX 50 MCG/0.5ML IM SUSR: 0.5000 mL | Freq: Once | INTRAMUSCULAR | 0 refills | Status: AC

## 2022-01-22 MED ORDER — LISINOPRIL 10 MG PO TABS: 10.0000 mg | ORAL_TABLET | Freq: Every day | ORAL | 3 refills | Status: DC

## 2022-01-22 MED ORDER — MONTELUKAST SODIUM 10 MG PO TABS: 10.0000 mg | ORAL_TABLET | Freq: Every day | ORAL | 3 refills | Status: DC

## 2022-01-22 MED ORDER — TIZANIDINE HCL 4 MG PO TABS: 4.0000 mg | ORAL_TABLET | Freq: Every day | ORAL | 1 refills | Status: DC | PRN

## 2022-01-22 MED ORDER — EZETIMIBE 10 MG PO TABS: 10.0000 mg | ORAL_TABLET | Freq: Every day | ORAL | 3 refills | Status: DC

## 2022-01-23 LAB — COMPLETE METABOLIC PANEL WITH GFR
AG Ratio: 1.6 (calc) (ref 1.0–2.5)
ALT: 34 U/L (ref 9–46)
AST: 19 U/L (ref 10–35)
Albumin: 4.2 g/dL (ref 3.6–5.1)
Alkaline phosphatase (APISO): 81 U/L (ref 35–144)
BUN: 11 mg/dL (ref 7–25)
CO2: 27 mmol/L (ref 20–32)
Calcium: 9 mg/dL (ref 8.6–10.3)
Chloride: 104 mmol/L (ref 98–110)
Creat: 1.16 mg/dL (ref 0.70–1.35)
Globulin: 2.6 g/dL (calc) (ref 1.9–3.7)
Glucose, Bld: 98 mg/dL (ref 65–99)
Potassium: 4.4 mmol/L (ref 3.5–5.3)
Sodium: 139 mmol/L (ref 135–146)
Total Bilirubin: 0.4 mg/dL (ref 0.2–1.2)
Total Protein: 6.8 g/dL (ref 6.1–8.1)
eGFR: 70 mL/min/{1.73_m2} (ref >=60)

## 2022-01-23 LAB — LIPID PANEL
Cholesterol: 169 mg/dL (ref <200)
HDL: 44 mg/dL (ref >=40)
LDL Cholesterol (Calc): 101 mg/dL (calc) — ABNORMAL HIGH
Non-HDL Cholesterol (Calc): 125 mg/dL (calc) (ref <130)
Total CHOL/HDL Ratio: 3.8 (calc) (ref <5.0)
Triglycerides: 138 mg/dL (ref <150)

## 2022-02-16 ENCOUNTER — Other Ambulatory Visit: Payer: Self-pay | Admitting: Family Medicine

## 2022-06-03 ENCOUNTER — Telehealth: Payer: PPO | Admitting: Physician Assistant

## 2022-06-03 DIAGNOSIS — J019 Acute sinusitis, unspecified: Secondary | ICD-10-CM | POA: Diagnosis not present

## 2022-06-03 DIAGNOSIS — B9689 Other specified bacterial agents as the cause of diseases classified elsewhere: Secondary | ICD-10-CM

## 2022-06-03 MED ORDER — ALBUTEROL SULFATE HFA 108 (90 BASE) MCG/ACT IN AERS: 2.0000 | INHALATION_SPRAY | Freq: Four times a day (QID) | RESPIRATORY_TRACT | 0 refills | Status: DC | PRN

## 2022-06-03 MED ORDER — AMOXICILLIN-POT CLAVULANATE 875-125 MG PO TABS: 1.0000 | ORAL_TABLET | Freq: Two times a day (BID) | ORAL | 0 refills | Status: DC

## 2022-06-03 NOTE — Progress Notes (Signed)
E-Visit for Sinus Problems  We are sorry that you are not feeling well.  Here is how we plan to help!  Based on what you have shared with me it looks like you have sinusitis.  Sinusitis is inflammation and infection in the sinus cavities of the head.  Based on your presentation I believe you most likely have Acute Bacterial Sinusitis.  This is an infection caused by bacteria and is treated with antibiotics. I have prescribed Augmentin '875mg'$ /'125mg'$  one tablet twice daily with food, for 7 days. You may use an oral decongestant such as Mucinex D or if you have glaucoma or high blood pressure use plain Mucinex. Saline nasal spray help and can safely be used as often as needed for congestion.  I have also sent in an albuterol inhaler to use as directed, if needed, for chest tightness or wheezing.    If you develop worsening sinus pain, fever or notice severe headache and vision changes, or if symptoms are not better after completion of antibiotic, please schedule an appointment with a health care provider.    Sinus infections are not as easily transmitted as other respiratory infection, however we still recommend that you avoid close contact with loved ones, especially the very young and elderly.  Remember to wash your hands thoroughly throughout the day as this is the number one way to prevent the spread of infection!  Home Care: Only take medications as instructed by your medical team. Complete the entire course of an antibiotic. Do not take these medications with alcohol. A steam or ultrasonic humidifier can help congestion.  You can place a towel over your head and breathe in the steam from hot water coming from a faucet. Avoid close contacts especially the very young and the elderly. Cover your mouth when you cough or sneeze. Always remember to wash your hands.  Get Help Right Away If: You develop worsening fever or sinus pain. You develop a severe head ache or visual changes. Your symptoms  persist after you have completed your treatment plan.  Make sure you Understand these instructions. Will watch your condition. Will get help right away if you are not doing well or get worse.  Thank you for choosing an e-visit.  Your e-visit answers were reviewed by a board certified advanced clinical practitioner to complete your personal care plan. Depending upon the condition, your plan could have included both over the counter or prescription medications.  Please review your pharmacy choice. Make sure the pharmacy is open so you can pick up prescription now. If there is a problem, you may contact your provider through [employer] and have the prescription routed to another pharmacy.  Your safety is important to [place]. If you have drug allergies check your prescription carefully.   For the next 24 hours you can use MyChart to ask questions about today's visit, request a non-urgent call back, or ask for a work or school excuse. You will get an email in the next two days asking about your experience. I hope that your e-visit has been valuable and will speed your recovery.

## 2022-06-15 NOTE — Progress Notes (Signed)
I have spent 5 minutes in review of e-visit questionnaire, review and updating patient chart, medical decision making and response to patient.   Alieah Brinton Cody Brysin Towery, PA-C    

## 2022-07-14 ENCOUNTER — Other Ambulatory Visit: Payer: Self-pay | Admitting: Family Medicine

## 2022-07-14 NOTE — Telephone Encounter (Signed)
Requested medication (s) are due for refill today - yes  Requested medication (s) are on the active medication list -yes  Future visit scheduled -yes  Last refill: 01/22/22   Notes to clinic: non delegated Rx- pharmacy requesting 90 day supply  Requested Prescriptions  Pending Prescriptions Disp Refills   tiZANidine (ZANAFLEX) 4 MG tablet [Pharmacy Med Name: TIZANIDINE HCL 4 MG TABLET] 90 tablet 1    Sig: Take 1 tablet (4 mg total) by mouth daily as needed for muscle spasms. As needed for muscle spasms     Not Delegated - Cardiovascular:  Alpha-2 Agonists - tizanidine Failed - 07/14/2022 11:30 AM      Failed - This refill cannot be delegated      Passed - Valid encounter within last 6 months    Recent Outpatient Visits           5 months ago Dyslipidemia   [facility] Delsa Grana, PA-C   1 year ago Nasal congestion due to prolonged use of decongestants   [place], DO   1 year ago Annual physical exam   [facility] Delsa Grana, PA-C   2 years ago Dyslipidemia   [facility] Lebron Conners D, MD   2 years ago Need for immunization against influenza   Kay, MD       Future Appointments             In 1 week Ralene Bathe, MD [place]   In 1 week Delsa Grana, PA-C [facility]               Requested Prescriptions  Pending Prescriptions Disp Refills   tiZANidine (ZANAFLEX) 4 MG tablet [Pharmacy Med Name: TIZANIDINE HCL 4 MG TABLET] 90 tablet 1    Sig: Take 1 tablet (4 mg total) by mouth daily as needed for muscle spasms. As needed for muscle spasms     Not Delegated - Cardiovascular:  Alpha-2 Agonists - tizanidine Failed - 07/14/2022 11:30 AM      Failed - This refill cannot be delegated      Passed - Valid encounter within last 6 months    Recent Outpatient  Visits           5 months ago Dyslipidemia   [facility] Delsa Grana, PA-C   1 year ago Nasal congestion due to prolonged use of decongestants   Jolley, DO   1 year ago Annual physical exam   [facility] Delsa Grana, PA-C   2 years ago Dyslipidemia   [facility] Lebron Conners D, MD   2 years ago Need for immunization against influenza   [facility] Towanda Malkin, MD       Future Appointments             In 1 week Ralene Bathe, MD [place]   In 1 week Delsa Grana, PA-C [facility]

## 2022-07-21 ENCOUNTER — Ambulatory Visit: Payer: PPO | Admitting: Dermatology

## 2022-07-21 VITALS — BP 137/83 | HR 65

## 2022-07-21 DIAGNOSIS — L578 Other skin changes due to chronic exposure to nonionizing radiation: Secondary | ICD-10-CM

## 2022-07-21 DIAGNOSIS — D2262 Melanocytic nevi of left upper limb, including shoulder: Secondary | ICD-10-CM | POA: Diagnosis not present

## 2022-07-21 DIAGNOSIS — D229 Melanocytic nevi, unspecified: Secondary | ICD-10-CM | POA: Diagnosis not present

## 2022-07-21 DIAGNOSIS — Z1283 Encounter for screening for malignant neoplasm of skin: Secondary | ICD-10-CM

## 2022-07-21 DIAGNOSIS — L739 Follicular disorder, unspecified: Secondary | ICD-10-CM | POA: Diagnosis not present

## 2022-07-21 DIAGNOSIS — D239 Other benign neoplasm of skin, unspecified: Secondary | ICD-10-CM

## 2022-07-21 DIAGNOSIS — D485 Neoplasm of uncertain behavior of skin: Secondary | ICD-10-CM

## 2022-07-21 DIAGNOSIS — L820 Inflamed seborrheic keratosis: Secondary | ICD-10-CM | POA: Diagnosis not present

## 2022-07-21 DIAGNOSIS — L821 Other seborrheic keratosis: Secondary | ICD-10-CM | POA: Diagnosis not present

## 2022-07-21 DIAGNOSIS — L814 Other melanin hyperpigmentation: Secondary | ICD-10-CM

## 2022-07-21 DIAGNOSIS — L570 Actinic keratosis: Secondary | ICD-10-CM

## 2022-07-21 HISTORY — DX: Other benign neoplasm of skin, unspecified: D23.9

## 2022-07-21 NOTE — Patient Instructions (Addendum)
Wound Care Instructions  Cleanse wound gently with soap and water once a day then pat dry with clean gauze. Apply a thin coat of Petrolatum (petroleum jelly, "Vaseline") over the wound (unless you have an allergy to this). We recommend that you use a new, sterile tube of Vaseline. Do not pick or remove scabs. Do not remove the yellow or white "healing tissue" from the base of the wound.  Cover the wound with fresh, clean, nonstick gauze and secure with paper tape. You may use Band-Aids in place of gauze and tape if the wound is small enough, but would recommend trimming much of the tape off as there is often too much. Sometimes Band-Aids can irritate the skin.  You should call the office for your biopsy report after 1 week if you have not already been contacted.  If you experience any problems, such as abnormal amounts of bleeding, swelling, significant bruising, significant pain, or evidence of infection, please call the office immediately.  FOR ADULT SURGERY PATIENTS: If you need something for pain relief you may take 1 extra strength Tylenol (acetaminophen) AND 2 Ibuprofen (200mg each) together every 4 hours as needed for pain. (do not take these if you are allergic to them or if you have a reason you should not take them.) Typically, you may only need pain medication for 1 to 3 days.     Due to recent changes in healthcare laws, you may see results of your pathology and/or laboratory studies on MyChart before the doctors have had a chance to review them. We understand that in some cases there may be results that are confusing or concerning to you. Please understand that not all results are received at the same time and often the doctors may need to interpret multiple results in order to provide you with the best plan of care or course of treatment. Therefore, we ask that you please give us 2 business days to thoroughly review all your results before contacting the office for clarification. Should  we see a critical lab result, you will be contacted sooner.   If You Need Anything After Your Visit  If you have any questions or concerns for your doctor, please call our main line at [phone] and press option 4 to reach your doctor's medical assistant. If no one answers, please leave a voicemail as directed and we will return your call as soon as possible. Messages left after 4 pm will be answered the following business day.   You may also send us a message via MyChart. We typically respond to MyChart messages within 1-2 business days.  For prescription refills, please ask your pharmacy to contact our office. Our fax number is [phone].  If you have an urgent issue when the clinic is closed that cannot wait until the next business day, you can page your doctor at the number below.    Please note that while we do our best to be available for urgent issues outside of office hours, we are not available 24/7.   If you have an urgent issue and are unable to reach us, you may choose to seek medical care at your doctor's office, retail clinic, urgent care center, or emergency room.  If you have a medical emergency, please immediately call 911 or go to the emergency department.  Pager Numbers  - Dr. Kowalski: [phone]  - Dr. Moye: [phone]  - Dr. Stewart: [phone]  In the event of inclement weather, please call our main line at   [phone] for an update on the status of any delays or closures.  Dermatology Medication Tips: Please keep the boxes that topical medications come in in order to help keep track of the instructions about where and how to use these. Pharmacies typically print the medication instructions only on the boxes and not directly on the medication tubes.   If your medication is too expensive, please contact our office at [phone] option 4 or send us a message through MyChart.   We are unable to tell what your co-pay for medications will be in  advance as this is different depending on your insurance coverage. However, we may be able to find a substitute medication at lower cost or fill out paperwork to get insurance to cover a needed medication.   If a prior authorization is required to get your medication covered by your insurance company, please allow us 1-2 business days to complete this process.  Drug prices often vary depending on where the prescription is filled and some pharmacies may offer cheaper prices.  The website www.goodrx.com contains coupons for medications through different pharmacies. The prices here do not account for what the cost may be with help from insurance (it may be cheaper with your insurance), but the website can give you the price if you did not use any insurance.  - You can print the associated coupon and take it with your prescription to the pharmacy.  - You may also stop by our office during regular business hours and pick up a GoodRx coupon card.  - If you need your prescription sent electronically to a different pharmacy, notify our office through [facility] MyChart or by phone at [phone] option 4.     Si Usted Necesita Algo Despus de Su Visita  Tambin puede enviarnos un mensaje a travs de MyChart. Por lo general respondemos a los mensajes de MyChart en el transcurso de 1 a 2 das hbiles.  Para renovar recetas, por favor pida a su farmacia que se ponga en contacto con nuestra oficina. Nuestro nmero de fax es el [phone].  Si tiene un asunto urgente cuando la clnica est cerrada y que no puede esperar hasta el siguiente da hbil, puede llamar/localizar a su doctor(a) al nmero que aparece a continuacin.   Por favor, tenga en cuenta que aunque hacemos todo lo posible para estar disponibles para asuntos urgentes fuera del horario de oficina, no estamos disponibles las 24 horas del da, los 7 das de la semana.   Si tiene un problema urgente y no puede comunicarse con nosotros, puede  optar por buscar atencin mdica  en el consultorio de su doctor(a), en una clnica privada, en un centro de atencin urgente o en una sala de emergencias.  Si tiene una emergencia mdica, por favor llame inmediatamente al 911 o vaya a la sala de emergencias.  Nmeros de bper  - Dr. Kowalski: [phone]  - Dra. Moye: [phone]  - Dra. Stewart: [phone]  En caso de inclemencias del tiempo, por favor llame a nuestra lnea principal al [phone] para una actualizacin sobre el estado de cualquier retraso o cierre.  Consejos para la medicacin en dermatologa: Por favor, guarde las cajas en las que vienen los medicamentos de uso tpico para ayudarle a seguir las instrucciones sobre dnde y cmo usarlos. Las farmacias generalmente imprimen las instrucciones del medicamento slo en las cajas y no directamente en los tubos del medicamento.   Si su medicamento es muy caro, por favor, pngase en contacto con   nuestra oficina llamando al [phone] y presione la opcin 4 o envenos un mensaje a travs de MyChart.   No podemos decirle cul ser su copago por los medicamentos por adelantado ya que esto es diferente dependiendo de la cobertura de su seguro. Sin embargo, es posible que podamos encontrar un medicamento sustituto a menor costo o llenar un formulario para que el seguro cubra el medicamento que se considera necesario.   Si se requiere una autorizacin previa para que su compaa de seguros cubra su medicamento, por favor permtanos de 1 a 2 das hbiles para completar este proceso.  Los precios de los medicamentos varan con frecuencia dependiendo del lugar de dnde se surte la receta y alguna farmacias pueden ofrecer precios ms baratos.  El sitio web www.goodrx.com tiene cupones para medicamentos de diferentes farmacias. Los precios aqu no tienen en cuenta lo que podra costar con la ayuda del seguro (puede ser ms barato con su seguro), pero el sitio web puede darle el  precio si no utiliz ningn seguro.  - Puede imprimir el cupn correspondiente y llevarlo con su receta a la farmacia.  - Tambin puede pasar por nuestra oficina durante el horario de atencin regular y recoger una tarjeta de cupones de GoodRx.  - Si necesita que su receta se enve electrnicamente a una farmacia diferente, informe a nuestra oficina a travs de MyChart de [facility] o por telfono llamando al [phone] y presione la opcin 4.  

## 2022-07-21 NOTE — Progress Notes (Signed)
New Patient Visit  Subjective  Curtis Underwood is a 66 y.o. male who presents for the following: Irregular skin lesions  (R arm, R cheek, L cheek, and back - irregular appearing, patient is concerned and would like them checked today ). The patient presents for Upper Body Skin Exam (UBSE) for skin cancer screening and mole check.  The patient has spots, moles and lesions to be evaluated, some may be new or changing and the patient has concerns that these could be cancer.  The following portions of the chart were reviewed this encounter and updated as appropriate:   Tobacco  Allergies  Meds  Problems  Med Hx  Surg Hx  Fam Hx     Review of Systems:  No other skin or systemic complaints except as noted in HPI or Assessment and Plan.  Objective  Well appearing patient in no apparent distress; mood and affect are within normal limits.  All skin waist up examined.  L lat tricep 0.3 cm brown macule with surrounding hypopigmentation measuring 0.6 cm.   L post shoulder 0.4 cm irregular brown macule.   R forearm x 1, L preauricular x 1 (2) Erythematous stuck-on, waxy papule or plaque  R preauricular x 1 Erythematous thin papules/macules with gritty scale.    Assessment & Plan  Neoplasm of uncertain behavior of skin (2) L lat tricep Epidermal / dermal shaving  Lesion diameter (cm):  0.3 Informed consent: discussed and consent obtained   Timeout: patient name, date of birth, surgical site, and procedure verified   Procedure prep:  Patient was prepped and draped in usual sterile fashion Prep type:  Isopropyl alcohol Anesthesia: the lesion was anesthetized in a standard fashion   Anesthetic:  1% lidocaine w/ epinephrine 1-100,000 buffered w/ 8.4% NaHCO3 Instrument used: flexible razor blade   Hemostasis achieved with: pressure, aluminum chloride and electrodesiccation   Outcome: patient tolerated procedure well   Post-procedure details: sterile dressing applied and wound care  instructions given   Dressing type: bandage and petrolatum    Specimen 1 - Surgical pathology Differential Diagnosis: D48.5 r/o dysplastic nevus  Check Margins: No  L post shoulder Epidermal / dermal shaving  Lesion diameter (cm):  0.4 Informed consent: discussed and consent obtained   Timeout: patient name, date of birth, surgical site, and procedure verified   Procedure prep:  Patient was prepped and draped in usual sterile fashion Prep type:  Isopropyl alcohol Anesthesia: the lesion was anesthetized in a standard fashion   Anesthetic:  1% lidocaine w/ epinephrine 1-100,000 buffered w/ 8.4% NaHCO3 Instrument used: flexible razor blade   Hemostasis achieved with: pressure, aluminum chloride and electrodesiccation   Outcome: patient tolerated procedure well   Post-procedure details: sterile dressing applied and wound care instructions given   Dressing type: bandage and petrolatum    Specimen 2 - Surgical pathology Differential Diagnosis: D48.5 r/o dysplastic nevus Check Margins: No  Inflamed seborrheic keratosis (2) R forearm x 1, L preauricular x 1 Symptomatic, irritating, patient would like treated. Destruction of lesion - R forearm x 1, L preauricular x 1 Complexity: simple   Destruction method: cryotherapy   Informed consent: discussed and consent obtained   Timeout:  patient name, date of birth, surgical site, and procedure verified Lesion destroyed using liquid nitrogen: Yes   Region frozen until ice ball extended beyond lesion: Yes   Outcome: patient tolerated procedure well with no complications   Post-procedure details: wound care instructions given    AK (actinic keratosis) R  preauricular x 1 Destruction of lesion - R preauricular x 1 Complexity: simple   Destruction method: cryotherapy   Informed consent: discussed and consent obtained   Timeout:  patient name, date of birth, surgical site, and procedure verified Lesion destroyed using liquid nitrogen: Yes    Region frozen until ice ball extended beyond lesion: Yes   Outcome: patient tolerated procedure well with no complications   Post-procedure details: wound care instructions given    Lentigines - Scattered tan macules - Due to sun exposure - Benign-appearing, observe - Recommend daily broad spectrum sunscreen SPF 30+ to sun-exposed areas, reapply every 2 hours as needed. - Call for any changes  Seborrheic Keratoses - Stuck-on, waxy, tan-brown papules and/or plaques  - Benign-appearing - Discussed benign etiology and prognosis. - Observe - Call for any changes  Melanocytic Nevi - Tan-brown and/or pink-flesh-colored symmetric macules and papules - Benign appearing on exam today - Observation - Call clinic for new or changing moles - Recommend daily use of broad spectrum spf 30+ sunscreen to sun-exposed areas.   Hemangiomas - Red papules - Discussed benign nature - Observe - Call for any changes  Actinic Damage - Chronic condition, secondary to cumulative UV/sun exposure - diffuse scaly erythematous macules with underlying dyspigmentation - Recommend daily broad spectrum sunscreen SPF 30+ to sun-exposed areas, reapply every 2 hours as needed.  - Staying in the shade or wearing long sleeves, sun glasses (UVA+UVB protection) and wide brim hats (4-inch brim around the entire circumference of the hat) are also recommended for sun protection.  - Call for new or changing lesions.  Skin cancer screening performed today.  Return in about 3 months (around 10/20/2022) for Ak follow up .  Luther Redo, CMA, am acting as scribe for Sarina Ser, MD . Documentation: I have reviewed the above documentation for accuracy and completeness, and I agree with the above.  Sarina Ser, MD

## 2022-07-23 ENCOUNTER — Ambulatory Visit: Payer: PPO | Admitting: Family Medicine

## 2022-07-31 ENCOUNTER — Encounter: Payer: Self-pay | Admitting: Dermatology

## 2022-08-03 ENCOUNTER — Telehealth: Payer: Self-pay

## 2022-08-03 NOTE — Telephone Encounter (Addendum)
  Patient notified of results and had no further questions at this time.   ----- Message from Ralene Bathe, MD sent at 07/30/2022  6:51 PM EST ----- Diagnosis 1. Skin , left lat tricep PIGMENTED SEBORRHEIC KERATOSIS, EARLY AND CHRONIC FOLLICULITIS 2. Skin , left post shoulder DYSPLASTIC COMPOUND NEVUS WITH MODERATE ATYPIA, LIMITED MARGINS FREE  1- benign keratosis No further treatment needed 2- Moderate dysplastic Recheck next visit

## 2022-08-10 ENCOUNTER — Encounter: Payer: Self-pay | Admitting: Family Medicine

## 2022-08-10 ENCOUNTER — Ambulatory Visit (INDEPENDENT_AMBULATORY_CARE_PROVIDER_SITE_OTHER): Payer: PPO | Admitting: Family Medicine

## 2022-08-10 DIAGNOSIS — Z0000 Encounter for general adult medical examination without abnormal findings: Secondary | ICD-10-CM

## 2022-08-10 DIAGNOSIS — G4733 Obstructive sleep apnea (adult) (pediatric): Secondary | ICD-10-CM

## 2022-08-10 DIAGNOSIS — E663 Overweight: Secondary | ICD-10-CM

## 2022-08-10 NOTE — Progress Notes (Signed)
Initial Annual Wellness Visit   Patient: Curtis Underwood, Male    DOB: June 18, 1956, 67 y.o.   MRN: [id number]  Subjective  Chief Complaint  Patient presents with   Medicare Wellness    Curtis Underwood is a 67 y.o. male who presents today for his Annual Wellness Visit. He reports consuming a general diet. He exercises by walking 3 miles 5 days per week. Also has gym membership.  He generally feels well. He reports sleeping well, wears CPAP. He does not have additional problems to discuss today.   HPI  Vision:Within last year and Dental: No current dental problems and Receives regular dental care   Patient Active Problem List   Diagnosis Date Noted   Primary hypertension 01/21/2022   Vitamin D insufficiency 01/31/2020   Overweight (BMI 25.0-29.9) 07/24/2018   History of transient ischemic attack (TIA) 04/14/2018   Gastroesophageal reflux disease without esophagitis 05/26/2015   Seasonal allergic rhinitis 05/26/2015   Dyslipidemia 05/26/2015   Smoker 05/26/2015   Obstructive sleep apnea 05/26/2015   Past Surgical History:  Procedure Laterality Date   APPENDECTOMY     Social History   Tobacco Use   Smoking status: Some Days    Types: Cigars   Smokeless tobacco: Never  Vaping Use   Vaping Use: Never used  Substance Use Topics   Alcohol use: Yes    Alcohol/week: 0.0 standard drinks of alcohol    Comment: occasionally   Drug use: No      Medications: Outpatient Medications Prior to Visit  Medication Sig   albuterol (VENTOLIN HFA) 108 (90 Base) MCG/ACT inhaler Inhale 2 puffs into the lungs every 6 (six) hours as needed for wheezing or shortness of breath.   aspirin 81 MG EC tablet TAKE 1 TABLET BY MOUTH EVERY DAY   atorvastatin (LIPITOR) 80 MG tablet Take 1 tablet (80 mg total) by mouth daily.   azelastine (ASTELIN) 0.1 % nasal spray Place 2 sprays into both nostrils 2 (two) times daily. Use in each nostril as directed   cetirizine (ZYRTEC) 10 MG tablet Take 10 mg by  mouth as needed for allergies.   ezetimibe (ZETIA) 10 MG tablet Take 1 tablet (10 mg total) by mouth daily.   fluticasone (FLONASE) 50 MCG/ACT nasal spray PLACE 2 SPRAYS INTO EACH NOSTRIL EVERY DAY   lisinopril (ZESTRIL) 10 MG tablet Take 1 tablet (10 mg total) by mouth daily.   montelukast (SINGULAIR) 10 MG tablet Take 1 tablet (10 mg total) by mouth at bedtime.   omeprazole (PRILOSEC) 20 MG capsule TAKE 1 CAPSULE BY MOUTH DAILY AS NEEDED   tiZANidine (ZANAFLEX) 4 MG tablet TAKE 1 TABLET (4 MG TOTAL) BY MOUTH DAILY AS NEEDED FOR MUSCLE SPASMS. AS NEEDED FOR MUSCLE SPASMS   [DISCONTINUED] FLUCELVAX QUADRIVALENT 0.5 ML injection Inject 0.5 mLs into the muscle once.   [DISCONTINUED] SHINGRIX injection Inject 0.5 mLs into the muscle once.   [DISCONTINUED] amoxicillin-clavulanate (AUGMENTIN) 875-125 MG tablet Take 1 tablet by mouth 2 (two) times daily. (Patient not taking: Reported on 07/21/2022)   No facility-administered medications prior to visit.    No Known Allergies  Patient Care Team: Delsa Grana, PA-C as PCP - General (Family Medicine) Ralene Bathe, MD (Dermatology)     Objective  There were no vitals taken for this visit. BP Readings from Last 3 Encounters:  07/21/22 137/83  01/22/22 140/76  02/05/21 (!) 152/86   Wt Readings from Last 3 Encounters:  01/22/22 229 lb 11.2 oz (104.2 kg)  06/01/21 241 lb (109.3 kg)  02/05/21 241 lb 3.2 oz (109.4 kg)    Physical Exam Well appearing, in NAD. Speaks in full sentences. Comfortable WOB on RA. No resp distress.    Most recent functional status assessment:    08/10/2022    8:52 AM  In your present state of health, do you have any difficulty performing the following activities:  Hearing? 0  Vision? 0  Difficulty concentrating or making decisions? 0  Walking or climbing stairs? 0  Dressing or bathing? 0  Doing errands, shopping? 0   Most recent fall risk assessment:    08/10/2022    8:51 AM  Fall Risk   Falls in the  past year? 0  Number falls in past yr: 0  Injury with Fall? 0    Most recent depression screenings:    08/10/2022    8:52 AM 01/22/2022    8:20 AM  PHQ 2/9 Scores  PHQ - 2 Score 0 0  PHQ- 9 Score 0 0   Most recent cognitive screening:    08/10/2022    9:21 AM  6CIT Screen  What Year? 0 points  What month? 0 points  What time? 0 points  Count back from 20 0 points  Months in reverse 2 points  Repeat phrase 0 points  Total Score 2 points   Most recent Audit-C alcohol use screening    08/10/2022    9:23 AM  Alcohol Use Disorder Test (AUDIT)  1. How often do you have a drink containing alcohol? 3  2. How many drinks containing alcohol do you have on a typical day when you are drinking? 1  3. How often do you have six or more drinks on one occasion? 1  AUDIT-C Score 5  4. How often during the last year have you found that you were not able to stop drinking once you had started? 0  5. How often during the last year have you failed to do what was normally expected from you because of drinking? 0  6. How often during the last year have you needed a first drink in the morning to get yourself going after a heavy drinking session? 0  7. How often during the last year have you had a feeling of guilt of remorse after drinking? 0  8. How often during the last year have you been unable to remember what happened the night before because you had been drinking? 0  9. Have you or someone else been injured as a result of your drinking? 0  10. Has a relative or friend or a doctor or another health worker been concerned about your drinking or suggested you cut down? 2  Alcohol Use Disorder Identification Test Final Score (AUDIT) 7  Alcohol Brief Interventions/Follow-up Alcohol education/Brief advice   A score of 3 or more in women, and 4 or more in men indicates increased risk for alcohol abuse, EXCEPT if all of the points are from question 1   Vision/Hearing Screen: No results found.  Last  CBC Lab Results  Component Value Date   WBC 8.9 02/05/2021   HGB 15.4 02/05/2021   HCT 46.1 02/05/2021   MCV 91.8 02/05/2021   MCH 30.7 02/05/2021   RDW 12.9 02/05/2021   PLT 214 58/52/7782   Last metabolic panel Lab Results  Component Value Date   GLUCOSE 98 01/22/2022   NA 139 01/22/2022   K 4.4 01/22/2022   CL 104 01/22/2022   CO2 27 01/22/2022  BUN 11 01/22/2022   CREATININE 1.16 01/22/2022   EGFR 70 01/22/2022   CALCIUM 9.0 01/22/2022   PROT 6.8 01/22/2022   ALBUMIN 4.4 11/16/2016   BILITOT 0.4 01/22/2022   ALKPHOS 91 11/16/2016   AST 19 01/22/2022   ALT 34 01/22/2022   ANIONGAP 7 12/02/2017   Last lipids Lab Results  Component Value Date   CHOL 169 01/22/2022   HDL 44 01/22/2022   LDLCALC 101 (H) 01/22/2022   TRIG 138 01/22/2022   CHOLHDL 3.8 01/22/2022   Last hemoglobin A1c No results found for: "HGBA1C"    No results found for any visits on 08/10/22.    Assessment & Plan   Annual wellness visit done today including the all of the following: Reviewed patient's Family Medical History Reviewed and updated list of patient's medical providers Assessment of cognitive impairment was done Assessed patient's functional ability Established a written schedule for health screening services Health Risk Assessent Completed and Reviewed  Exercise Activities and Dietary recommendations  Goals      DIET - REDUCE SUGAR INTAKE     Weight (lb) < 215 lb (97.5 kg)        Immunization History  Administered Date(s) Administered   Influenza Inj Mdck Quad Pf 04/03/2022   Influenza,inj,Quad PF,6+ Mos 05/26/2015, 04/14/2018, 04/05/2019, 05/08/2020   PFIZER(Purple Top)SARS-COV-2 Vaccination 10/08/2019, 10/30/2019   PNEUMOCOCCAL CONJUGATE-20 01/22/2022   Pneumococcal Polysaccharide-23 05/27/2020   Tdap 07/24/2018   Zoster Recombinat (Shingrix) 04/08/2022, 06/08/2022    Health Maintenance  Topic Date Due   COVID-19 Vaccine (3 - Pfizer risk series) 11/27/2019    Fecal DNA (Cologuard)  02/18/2023   Medicare Annual Wellness (AWV)  08/11/2023   DTaP/Tdap/Td (2 - Td or Tdap) 07/24/2028   Pneumonia Vaccine 37+ Years old  Completed   INFLUENZA VACCINE  Completed   Hepatitis C Screening  Completed   HIV Screening  Completed   Zoster Vaccines- Shingrix  Completed   HPV VACCINES  Aged Out     Discussed health benefits of physical activity, and encouraged him to engage in regular exercise appropriate for his age and condition.    Problem List Items Addressed This Visit       Respiratory   Obstructive sleep apnea    Compliant with CPAP        Other   Overweight (BMI 25.0-29.9)   Other Visit Diagnoses     Medicare annual wellness visit, initial    -  Primary       Return in about 6 months (around 02/08/2023) for cpe.     Myles Gip, DO

## 2022-08-10 NOTE — Patient Instructions (Signed)

## 2022-08-10 NOTE — Assessment & Plan Note (Signed)
Compliant with CPAP 

## 2022-09-27 ENCOUNTER — Ambulatory Visit (INDEPENDENT_AMBULATORY_CARE_PROVIDER_SITE_OTHER): Payer: PPO | Admitting: Family Medicine

## 2022-09-27 ENCOUNTER — Encounter: Payer: Self-pay | Admitting: Family Medicine

## 2022-09-27 VITALS — BP 126/68 | HR 74 | Temp 97.7°F | Resp 16 | Ht 75.0 in | Wt 238.1 lb

## 2022-09-27 DIAGNOSIS — Z0000 Encounter for general adult medical examination without abnormal findings: Secondary | ICD-10-CM

## 2022-09-27 DIAGNOSIS — E785 Hyperlipidemia, unspecified: Secondary | ICD-10-CM | POA: Diagnosis not present

## 2022-09-27 DIAGNOSIS — M5431 Sciatica, right side: Secondary | ICD-10-CM | POA: Diagnosis not present

## 2022-09-27 DIAGNOSIS — I10 Essential (primary) hypertension: Secondary | ICD-10-CM

## 2022-09-27 DIAGNOSIS — Z125 Encounter for screening for malignant neoplasm of prostate: Secondary | ICD-10-CM

## 2022-09-27 DIAGNOSIS — E663 Overweight: Secondary | ICD-10-CM

## 2022-09-27 NOTE — Patient Instructions (Signed)
Health Maintenance  Topic Date Due   COVID-19 Vaccine (3 - Pfizer risk series) 10/13/2022*   Cologuard (Stool DNA test)  02/18/2023   Medicare Annual Wellness Visit  08/11/2023   DTaP/Tdap/Td vaccine (2 - Td or Tdap) 07/24/2028   Pneumonia Vaccine  Completed   Flu Shot  Completed   Hepatitis C Screening: USPSTF Recommendation to screen - Ages 18-66 yo.  Completed   HIV Screening  Completed   Zoster (Shingles) Vaccine  Completed   HPV Vaccine  Aged Out  *Topic was postponed. The date shown is not the original due date.      Preventive Care 66 Years and Older, Male Preventive care refers to lifestyle choices and visits with your health care provider that can promote health and wellness. Preventive care visits are also called wellness exams. What can I expect for my preventive care visit? Counseling During your preventive care visit, your health care provider may ask about your: Medical history, including: Past medical problems. Family medical history. History of falls. Current health, including: Emotional well-being. Home life and relationship well-being. Sexual activity. Memory and ability to understand (cognition). Lifestyle, including: Alcohol, nicotine or tobacco, and drug use. Access to firearms. Diet, exercise, and sleep habits. Work and work Statistician. Sunscreen use. Safety issues such as seatbelt and bike helmet use. Physical exam Your health care provider will check your: Height and weight. These may be used to calculate your BMI (body mass index). BMI is a measurement that tells if you are at a healthy weight. Waist circumference. This measures the distance around your waistline. This measurement also tells if you are at a healthy weight and may help predict your risk of certain diseases, such as type 2 diabetes and high blood pressure. Heart rate and blood pressure. Body temperature. Skin for abnormal spots. What immunizations do I need?  Vaccines are usually  given at various ages, according to a schedule. Your health care provider will recommend vaccines for you based on your age, medical history, and lifestyle or other factors, such as travel or where you work. What tests do I need? Screening Your health care provider may recommend screening tests for certain conditions. This may include: Lipid and cholesterol levels. Diabetes screening. This is done by checking your blood sugar (glucose) after you have not eaten for a while (fasting). Hepatitis C test. Hepatitis B test. HIV (human immunodeficiency virus) test. STI (sexually transmitted infection) testing, if you are at risk. Lung cancer screening. Colorectal cancer screening. Prostate cancer screening. Abdominal aortic aneurysm (AAA) screening. You may need this if you are a current or former smoker. Talk with your health care provider about your test results, treatment options, and if necessary, the need for more tests. Follow these instructions at home: Eating and drinking  Eat a diet that includes fresh fruits and vegetables, whole grains, lean protein, and low-fat dairy products. Limit your intake of foods with high amounts of sugar, saturated fats, and salt. Take vitamin and mineral supplements as recommended by your health care provider. Do not drink alcohol if your health care provider tells you not to drink. If you drink alcohol: Limit how much you have to 0-2 drinks a day. Know how much alcohol is in your drink. In the [place] one drink equals one 12 oz bottle of beer (355 mL), one 5 oz glass of wine (148 mL), or one 1 oz glass of hard liquor (44 mL). Lifestyle Brush your teeth every morning and night with fluoride toothpaste. Floss one  time each day. Exercise for at least 30 minutes 5 or more days each week. Do not use any products that contain nicotine or tobacco. These products include cigarettes, chewing tobacco, and vaping devices, such as e-cigarettes. If you need help  quitting, ask your health care provider. Do not use drugs. If you are sexually active, practice safe sex. Use a condom or other form of protection to prevent STIs. Take aspirin only as told by your health care provider. Make sure that you understand how much to take and what form to take. Work with your health care provider to find out whether it is safe and beneficial for you to take aspirin daily. Ask your health care provider if you need to take a cholesterol-lowering medicine (statin). Find healthy ways to manage stress, such as: Meditation, yoga, or listening to music. Journaling. Talking to a trusted person. Spending time with friends and family. Safety Always wear your seat belt while driving or riding in a vehicle. Do not drive: If you have been drinking alcohol. Do not ride with someone who has been drinking. When you are tired or distracted. While texting. If you have been using any mind-altering substances or drugs. Wear a helmet and other protective equipment during sports activities. If you have firearms in your house, make sure you follow all gun safety procedures. Minimize exposure to UV radiation to reduce your risk of skin cancer. What's next? Visit your health care provider once a year for an annual wellness visit. Ask your health care provider how often you should have your eyes and teeth checked. Stay up to date on all vaccines. This information is not intended to replace advice given to you by your health care provider. Make sure you discuss any questions you have with your health care provider. Document Revised: 01/14/2021 Document Reviewed: 01/14/2021 Elsevier Patient Education  [place].

## 2022-09-27 NOTE — Progress Notes (Signed)
Patient: Curtis Underwood, Male    DOB: 04/15/1956, 67 y.o.   MRN: [id number] Delsa Grana, PA-C Visit Date: 09/27/2022  Today's Provider: Delsa Grana, PA-C   Chief Complaint  Patient presents with   Annual Exam   Subjective:   Annual physical exam:  Curtis Underwood is a 67 y.o. male who presents today for health maintenance and annual & complete physical exam.   Exercise/Activity: Goes walking or to the gym treadmill roughly 3 miles per day at least 5 days a week Diet/nutrition:  healthy  Sleep: Sleeping well  SDOH Screenings   Food Insecurity: No Food Insecurity (09/27/2022)  Housing: Low Risk  (09/27/2022)  Transportation Needs: No Transportation Needs (09/27/2022)  Utilities: Not At Risk (09/27/2022)  Alcohol Screen: Low Risk  (09/27/2022)  Depression (PHQ2-9): Low Risk  (09/27/2022)  Financial Resource Strain: Low Risk  (09/27/2022)  Physical Activity: Sufficiently Active (09/27/2022)  Social Connections: Moderately Isolated (09/27/2022)  Stress: No Stress Concern Present (09/27/2022)  Tobacco Use: High Risk (09/27/2022)   Sciatica - flared up - right side - some episodes of this that he is able to exercise through sometimes will do stretches and use muscle lectures, heat and ice and over-the-counter NSAIDs  USPSTF grade A and B recommendations - reviewed and addressed today  Depression:  Phq 9 completed today by patient, was reviewed by me with patient in the room, score is  negative, pt feels good    09/27/2022    8:49 AM 08/10/2022    8:52 AM 01/22/2022    8:20 AM  Depression screen PHQ 2/9  Decreased Interest 0 0 0  Down, Depressed, Hopeless 0 0 0  PHQ - 2 Score 0 0 0  Altered sleeping 0 0 0  Tired, decreased energy 0 0 0  Change in appetite 0 0 0  Feeling bad or failure about yourself  0 0 0  Trouble concentrating 0 0 0  Moving slowly or fidgety/restless 0 0 0  Suicidal thoughts 0 0 0  PHQ-9 Score 0 0 0  Difficult doing work/chores Not difficult at all Not  difficult at all Not difficult at all    Hep C Screening: done previously  STD testing and prevention (HIV/chl/gon/syphilis): done in the past, no exposure or risk  Intimate partner violence:denies  Advanced Care Planning:  A voluntary discussion about advance care planning including the explanation and discussion of advance directives.  Discussed health care proxy and Living will, and the patient was able to identify a health care proxy as wife.  Patient does have a living will at present time. If patient does have living will, I have requested they bring this to the clinic to be scanned in to their chart.  Health Maintenance  Topic Date Due   COVID-19 Vaccine (3 - Pfizer risk series) 10/13/2022 (Originally 11/27/2019)   Fecal DNA (Cologuard)  02/18/2023   Medicare Annual Wellness (AWV)  08/11/2023   DTaP/Tdap/Td (2 - Td or Tdap) 07/24/2028   Pneumonia Vaccine 64+ Years old  Completed   INFLUENZA VACCINE  Completed   Hepatitis C Screening  Completed   HIV Screening  Completed   Zoster Vaccines- Shingrix  Completed   HPV VACCINES  Aged Out    Colorectal cancer:  cologuard is due later this year   Pt denies change in bowels, blood in stool  Prostate cancer:  Prostate cancer screening with PSA: Discussed risks and benefits of PSA testing and provided handout. Pt wishes to have PSA drawn  today. Lab Results  Component Value Date   PSA 0.81 02/05/2021   PSA 1.2 01/31/2020   PSA 1.3 07/24/2018    Urinary Symptoms:   IPSS     Row Name 09/27/22 0902         International Prostate Symptom Score   How often have you had the sensation of not emptying your bladder? Not at All     How often have you had to urinate less than every two hours? Not at All     How often have you found you stopped and started again several times when you urinated? Not at All     How often have you found it difficult to postpone urination? Not at All     How often have you had a weak urinary stream? Not  at All     How often have you had to strain to start urination? Not at All     How many times did you typically get up at night to urinate? None     Total IPSS Score 0       Quality of Life due to urinary symptoms   If you were to spend the rest of your life with your urinary condition just the way it is now how would you feel about that? Delighted              Lung cancer:   Low Dose CT Chest recommended if Age 104-80 years, 20 pack-year currently smoking OR have quit w/in 15years. Patient does not qualify.   Social History   Tobacco Use   Smoking status: Some Days    Types: Cigars   Smokeless tobacco: Never  Substance Use Topics   Alcohol use: Yes    Alcohol/week: 4.0 standard drinks of alcohol    Types: 4 Cans of beer per week    Comment: 3-4 beers     Alcohol screening: [place] Visit from 09/27/2022 in [facility]  AUDIT-C Score 4       AAA:  The USPSTF recommends one-time screening with ultrasonography in men ages 33 to 34 years who have ever smoked  ECG:not indicated today  Blood pressure/Hypertension: BP Readings from Last 3 Encounters:  09/27/22 126/68  07/21/22 137/83  01/22/22 140/76   Weight/Obesity: Wt Readings from Last 3 Encounters:  09/27/22 238 lb 1.6 oz (108 kg)  01/22/22 229 lb 11.2 oz (104.2 kg)  06/01/21 241 lb (109.3 kg)   BMI Readings from Last 3 Encounters:  09/27/22 29.76 kg/m  01/22/22 28.71 kg/m  06/01/21 30.12 kg/m    Lipids:  Lab Results  Component Value Date   CHOL 169 01/22/2022   CHOL 156 02/05/2021   CHOL 170 01/31/2020   Lab Results  Component Value Date   HDL 44 01/22/2022   HDL 54 02/05/2021   HDL 49 01/31/2020   Lab Results  Component Value Date   LDLCALC 101 (H) 01/22/2022   LDLCALC 85 02/05/2021   LDLCALC 105 (H) 01/31/2020   Lab Results  Component Value Date   TRIG 138 01/22/2022   TRIG 80 02/05/2021   TRIG 75 01/31/2020   Lab Results  Component Value Date    CHOLHDL 3.8 01/22/2022   CHOLHDL 2.9 02/05/2021   CHOLHDL 3.5 01/31/2020   No results found for: "LDLDIRECT" Based on the results of lipid panel his/her cardiovascular risk factor ( using [place] )  in the next 10 years is : The 10-year ASCVD risk  score (Arnett DK, et al., 2019) is: 18.9%   Values used to calculate the score:     Age: 21 years     Sex: Male     Is Non-Hispanic African American: No     Diabetic: No     Tobacco smoker: Yes     Systolic Blood Pressure: 123XX123 mmHg     Is BP treated: Yes     HDL Cholesterol: 44 mg/dL     Total Cholesterol: 169 mg/dL Glucose:  Glucose, Bld  Date Value Ref Range Status  01/22/2022 98 65 - 99 mg/dL Final    Comment:    .            Fasting reference interval .   02/05/2021 91 65 - 99 mg/dL Final    Comment:    .            Fasting reference interval .   05/08/2020 88 65 - 99 mg/dL Final    Comment:    .            Fasting reference interval .     Social History       Social History   Socioeconomic History   Marital status: Married    Spouse name: Designer, television/film set   Number of children: 2   Years of education: 14   Highest education level: Some college, no degree  Occupational History   Not on file  Tobacco Use   Smoking status: Some Days    Types: Cigars   Smokeless tobacco: Never  Vaping Use   Vaping Use: Never used  Substance and Sexual Activity   Alcohol use: Yes    Alcohol/week: 4.0 standard drinks of alcohol    Types: 4 Cans of beer per week    Comment: 3-4 beers   Drug use: No   Sexual activity: Yes    Partners: Female  Other Topics Concern   Not on file  Social History Narrative   Not on file   Social Determinants of Health   Financial Resource Strain: Low Risk  (09/27/2022)   Overall Financial Resource Strain (CARDIA)    Difficulty of Paying Living Expenses: Not hard at all  Food Insecurity: No Food Insecurity (09/27/2022)   Hunger Vital Sign    Worried About Running Out of Food in the Last  Year: Never true    Ran Out of Food in the Last Year: Never true  Transportation Needs: No Transportation Needs (09/27/2022)   PRAPARE - Hydrologist (Medical): No    Lack of Transportation (Non-Medical): No  Physical Activity: Sufficiently Active (09/27/2022)   Exercise Vital Sign    Days of Exercise per Week: 5 days    Minutes of Exercise per Session: 60 min  Stress: No Stress Concern Present (09/27/2022)   Novice    Feeling of Stress : Not at all  Social Connections: Moderately Isolated (09/27/2022)   Social Connection and Isolation Panel [NHANES]    Frequency of Communication with Friends and Family: More than three times a week    Frequency of Social Gatherings with Friends and Family: Once a week    Attends Religious Services: Never    Marine scientist or Organizations: No    Attends Music therapist: Never    Marital Status: Married    Family History        Family History  Adopted: Yes  Family  history unknown: Yes    Patient Active Problem List   Diagnosis Date Noted   Primary hypertension 01/21/2022   Vitamin D insufficiency 01/31/2020   Overweight (BMI 25.0-29.9) 07/24/2018   History of transient ischemic attack (TIA) 04/14/2018   Gastroesophageal reflux disease without esophagitis 05/26/2015   Seasonal allergic rhinitis 05/26/2015   Dyslipidemia 05/26/2015   Smoker 05/26/2015   Obstructive sleep apnea 05/26/2015    Past Surgical History:  Procedure Laterality Date   APPENDECTOMY       Current Outpatient Medications:    albuterol (VENTOLIN HFA) 108 (90 Base) MCG/ACT inhaler, Inhale 2 puffs into the lungs every 6 (six) hours as needed for wheezing or shortness of breath., Disp: 8 g, Rfl: 0   aspirin 81 MG EC tablet, TAKE 1 TABLET BY MOUTH EVERY DAY, Disp: 90 tablet, Rfl: 1   atorvastatin (LIPITOR) 80 MG tablet, Take 1 tablet (80 mg total) by  mouth daily., Disp: 90 tablet, Rfl: 3   cetirizine (ZYRTEC) 10 MG tablet, Take 10 mg by mouth as needed for allergies., Disp: , Rfl:    ezetimibe (ZETIA) 10 MG tablet, Take 1 tablet (10 mg total) by mouth daily., Disp: 90 tablet, Rfl: 3   fluticasone (FLONASE) 50 MCG/ACT nasal spray, PLACE 2 SPRAYS INTO EACH NOSTRIL EVERY DAY, Disp: 48 mL, Rfl: 6   lisinopril (ZESTRIL) 10 MG tablet, Take 1 tablet (10 mg total) by mouth daily., Disp: 90 tablet, Rfl: 3   montelukast (SINGULAIR) 10 MG tablet, Take 1 tablet (10 mg total) by mouth at bedtime., Disp: 90 tablet, Rfl: 3   omeprazole (PRILOSEC) 20 MG capsule, TAKE 1 CAPSULE BY MOUTH DAILY AS NEEDED, Disp: 90 capsule, Rfl: 1   tiZANidine (ZANAFLEX) 4 MG tablet, TAKE 1 TABLET (4 MG TOTAL) BY MOUTH DAILY AS NEEDED FOR MUSCLE SPASMS. AS NEEDED FOR MUSCLE SPASMS, Disp: 90 tablet, Rfl: 1   azelastine (ASTELIN) 0.1 % nasal spray, Place 2 sprays into both nostrils 2 (two) times daily. Use in each nostril as directed (Patient not taking: Reported on 09/27/2022), Disp: 30 mL, Rfl: 12  No Known Allergies  Patient Care Team: Delsa Grana, PA-C as PCP - General (Family Medicine) Ralene Bathe, MD (Dermatology)   Chart Review: I personally reviewed active problem list, medication list, allergies, family history, social history, health maintenance, notes from last encounter, lab results, imaging with the patient/caregiver today.   Review of Systems  Constitutional: Negative.   HENT: Negative.    Eyes: Negative.   Respiratory: Negative.    Cardiovascular: Negative.   Gastrointestinal: Negative.   Endocrine: Negative.   Genitourinary: Negative.   Musculoskeletal: Negative.   Skin: Negative.   Allergic/Immunologic: Negative.   Neurological: Negative.   Hematological: Negative.   Psychiatric/Behavioral: Negative.    All other systems reviewed and are negative.         Objective:   Vitals:  Vitals:   09/27/22 0850  BP: 126/68  Pulse: 74  Resp:  16  Temp: 97.7 F (36.5 C)  TempSrc: Oral  SpO2: 93%  Weight: 238 lb 1.6 oz (108 kg)  Height: '6\' 3"'$  (1.905 m)    Body mass index is 29.76 kg/m.  Physical Exam Constitutional:      General: He is not in acute distress.    Appearance: Normal appearance. He is well-developed. He is not ill-appearing, toxic-appearing or diaphoretic.  HENT:     Head: Normocephalic and atraumatic.     Jaw: No trismus.     Right Ear: Tympanic  membrane, ear canal and external ear normal.     Left Ear: Tympanic membrane, ear canal and external ear normal.     Nose: Nose normal. No mucosal edema or rhinorrhea.     Right Sinus: No maxillary sinus tenderness or frontal sinus tenderness.     Left Sinus: No maxillary sinus tenderness or frontal sinus tenderness.     Mouth/Throat:     Mouth: Mucous membranes are moist.     Pharynx: Oropharynx is clear. Uvula midline. No oropharyngeal exudate, posterior oropharyngeal erythema or uvula swelling.  Eyes:     General: Lids are normal. No scleral icterus.       Right eye: No discharge.        Left eye: No discharge.     Conjunctiva/sclera: Conjunctivae normal.  Neck:     Trachea: Trachea and phonation normal. No tracheal deviation.  Cardiovascular:     Rate and Rhythm: Normal rate and regular rhythm.     Pulses: Normal pulses.          Radial pulses are 2+ on the right side and 2+ on the left side.       Posterior tibial pulses are 2+ on the right side and 2+ on the left side.     Heart sounds: Normal heart sounds. No murmur heard.    No friction rub. No gallop.  Pulmonary:     Effort: Pulmonary effort is normal.     Breath sounds: Normal breath sounds. No wheezing, rhonchi or rales.  Abdominal:     General: Bowel sounds are normal. There is no distension.     Palpations: Abdomen is soft.     Tenderness: There is no abdominal tenderness. There is no guarding or rebound.  Musculoskeletal:     Cervical back: Normal range of motion and neck supple.  Skin:     General: Skin is warm and dry.     Capillary Refill: Capillary refill takes less than 2 seconds.     Findings: No rash.  Neurological:     Mental Status: He is alert. Mental status is at baseline.     Gait: Gait normal.  Psychiatric:        Mood and Affect: Mood normal.        Speech: Speech normal.        Behavior: Behavior normal.      No results found for this or any previous visit (from the past 2160 hour(s)).  Fall Risk:    09/27/2022    8:49 AM 08/10/2022    8:51 AM 01/22/2022    8:19 AM 06/01/2021   11:33 AM 02/05/2021    9:54 AM  Fall Risk   Falls in the past year? 0 0 0 0 0  Number falls in past yr: 0 0 0 0 0  Injury with Fall? 0 0 0 0 0  Risk for fall due to : No Fall Risks  No Fall Risks    Follow up Falls prevention discussed;Education provided;Falls evaluation completed  Falls prevention discussed;Education provided      Functional Status Survey: Is the patient deaf or have difficulty hearing?: No Does the patient have difficulty seeing, even when wearing glasses/contacts?: No Does the patient have difficulty concentrating, remembering, or making decisions?: No Does the patient have difficulty walking or climbing stairs?: No Does the patient have difficulty dressing or bathing?: No Does the patient have difficulty doing errands alone such as visiting a doctor's office or shopping?: No   Assessment &  Plan:    CPE completed today  Prostate cancer screening and PSA options (with potential risks and benefits of testing vs not testing) were discussed along with recent recs/guidelines, shared decision making and handout/information given to pt today  USPSTF grade A and B recommendations reviewed with patient; age-appropriate recommendations, preventive care, screening tests, etc discussed and encouraged; healthy living encouraged; see AVS for patient education given to patient  Discussed importance of 150 minutes of physical activity weekly, AHA exercise  recommendations given to pt in AVS/handout  Discussed importance of healthy diet:  eating lean meats and proteins, avoiding trans fats and saturated fats, avoid simple sugars and excessive carbs in diet, eat 6 servings of fruit/vegetables daily and drink plenty of water and avoid sweet beverages.  DASH diet reviewed if pt has HTN  Recommended pt to do annual eye exam and routine dental exams/cleanings  Advance Care planning information and packet discussed and offered today, encouraged pt to discuss with family members/spouse/partner/friends and complete Advanced directive packet and bring copy to office   Reviewed Health Maintenance: Health Maintenance  Topic Date Due   COVID-19 Vaccine (3 - Pfizer risk series) 10/13/2022 (Originally 11/27/2019)   Fecal DNA (Cologuard)  02/18/2023   Medicare Annual Wellness (AWV)  08/11/2023   DTaP/Tdap/Td (2 - Td or Tdap) 07/24/2028   Pneumonia Vaccine 70+ Years old  Completed   INFLUENZA VACCINE  Completed   Hepatitis C Screening  Completed   HIV Screening  Completed   Zoster Vaccines- Shingrix  Completed   HPV VACCINES  Aged Out    Immunizations: Immunization History  Administered Date(s) Administered   Influenza Inj Mdck Quad Pf 04/03/2022   Influenza,inj,Quad PF,6+ Mos 05/26/2015, 04/14/2018, 04/05/2019, 05/08/2020   PFIZER(Purple Top)SARS-COV-2 Vaccination 10/08/2019, 10/30/2019   PNEUMOCOCCAL CONJUGATE-20 01/22/2022   Pneumococcal Polysaccharide-23 05/27/2020   Tdap 07/24/2018   Zoster Recombinat (Shingrix) 04/08/2022, 06/08/2022   Vaccines:  HPV: up to at age 27 , ask insurance if age between 8-45  Shingdone Pneumonia: done Flu: done/utd       ICD-10-CM   1. Adult general medical exam  123456 COMPLETE METABOLIC PANEL WITH GFR    Lipid panel    PSA    CBC with Differential/Platelet    2. Primary hypertension  99991111 COMPLETE METABOLIC PANEL WITH GFR    3. Dyslipidemia  99991111 COMPLETE METABOLIC PANEL WITH GFR    Lipid panel     4. Overweight (BMI 25.0-29.9)  E66.3     5. Screening for prostate cancer  Z12.5 PSA      Return in about 6 months (around 03/28/2023) for Routine follow-up.     Delsa Grana, PA-C 09/27/22 9:21 AM  [facility]

## 2022-09-28 LAB — COMPLETE METABOLIC PANEL WITH GFR
AG Ratio: 1.8 (calc) (ref 1.0–2.5)
ALT: 34 U/L (ref 9–46)
AST: 20 U/L (ref 10–35)
Albumin: 4.3 g/dL (ref 3.6–5.1)
Alkaline phosphatase (APISO): 83 U/L (ref 35–144)
BUN: 15 mg/dL (ref 7–25)
CO2: 28 mmol/L (ref 20–32)
Calcium: 9.3 mg/dL (ref 8.6–10.3)
Chloride: 102 mmol/L (ref 98–110)
Creat: 1.04 mg/dL (ref 0.70–1.35)
Globulin: 2.4 g/dL (calc) (ref 1.9–3.7)
Glucose, Bld: 95 mg/dL (ref 65–99)
Potassium: 4.5 mmol/L (ref 3.5–5.3)
Sodium: 138 mmol/L (ref 135–146)
Total Bilirubin: 0.6 mg/dL (ref 0.2–1.2)
Total Protein: 6.7 g/dL (ref 6.1–8.1)
eGFR: 80 mL/min/{1.73_m2} (ref >=60)

## 2022-09-28 LAB — CBC WITH DIFFERENTIAL/PLATELET
Absolute Monocytes: 545 cells/uL (ref 200–950)
Basophils Absolute: 62 cells/uL (ref 0–200)
Basophils Relative: 0.9 %
Eosinophils Absolute: 131 cells/uL (ref 15–500)
Eosinophils Relative: 1.9 %
HCT: 45.5 % (ref 38.5–50.0)
Hemoglobin: 15.1 g/dL (ref 13.2–17.1)
Lymphs Abs: 1415 cells/uL (ref 850–3900)
MCH: 30.3 pg (ref 27.0–33.0)
MCHC: 33.2 g/dL (ref 32.0–36.0)
MCV: 91.4 fL (ref 80.0–100.0)
MPV: 12.5 fL (ref 7.5–12.5)
Monocytes Relative: 7.9 %
Neutro Abs: 4747 cells/uL (ref 1500–7800)
Neutrophils Relative %: 68.8 %
Platelets: 242 10*3/uL (ref 140–400)
RBC: 4.98 10*6/uL (ref 4.20–5.80)
RDW: 12.2 % (ref 11.0–15.0)
Total Lymphocyte: 20.5 %
WBC: 6.9 10*3/uL (ref 3.8–10.8)

## 2022-09-28 LAB — LIPID PANEL
Cholesterol: 134 mg/dL (ref <200)
HDL: 38 mg/dL — ABNORMAL LOW (ref >=40)
LDL Cholesterol (Calc): 67 mg/dL (calc)
Non-HDL Cholesterol (Calc): 96 mg/dL (calc) (ref <130)
Total CHOL/HDL Ratio: 3.5 (calc) (ref <5.0)
Triglycerides: 249 mg/dL — ABNORMAL HIGH (ref <150)

## 2022-09-28 LAB — PSA: PSA: 0.99 ng/mL (ref <=4.00)

## 2022-10-01 ENCOUNTER — Encounter: Payer: Self-pay | Admitting: Family Medicine

## 2022-10-07 ENCOUNTER — Ambulatory Visit: Payer: PPO | Admitting: Dermatology

## 2022-10-07 DIAGNOSIS — Z1283 Encounter for screening for malignant neoplasm of skin: Secondary | ICD-10-CM | POA: Diagnosis not present

## 2022-10-07 DIAGNOSIS — D229 Melanocytic nevi, unspecified: Secondary | ICD-10-CM | POA: Diagnosis not present

## 2022-10-07 DIAGNOSIS — Z86018 Personal history of other benign neoplasm: Secondary | ICD-10-CM | POA: Diagnosis not present

## 2022-10-07 DIAGNOSIS — L821 Other seborrheic keratosis: Secondary | ICD-10-CM | POA: Diagnosis not present

## 2022-10-07 DIAGNOSIS — L578 Other skin changes due to chronic exposure to nonionizing radiation: Secondary | ICD-10-CM | POA: Diagnosis not present

## 2022-10-07 DIAGNOSIS — L570 Actinic keratosis: Secondary | ICD-10-CM | POA: Diagnosis not present

## 2022-10-07 DIAGNOSIS — L814 Other melanin hyperpigmentation: Secondary | ICD-10-CM | POA: Diagnosis not present

## 2022-10-07 NOTE — Progress Notes (Signed)
Follow-Up Visit   Subjective  Curtis Underwood is a 67 y.o. male who presents for the following: Follow-up. 3 months f/u hx of precancers and abnormal moles.  The patient presents for Upper Body Skin Exam (UBSE) for skin cancer screening and mole check.  The patient has spots, moles and lesions to be evaluated, some may be new or changing and the patient has concerns that these could be cancer.   The following portions of the chart were reviewed this encounter and updated as appropriate:   Tobacco  Allergies  Meds  Problems  Med Hx  Surg Hx  Fam Hx     Review of Systems:  No other skin or systemic complaints except as noted in HPI or Assessment and Plan.  Objective  Well appearing patient in no apparent distress; mood and affect are within normal limits.  All skin waist up examined.  left temple Erythematous thin papules/macules with gritty scale.    Assessment & Plan  AK (actinic keratosis) left temple  Actinic keratoses are precancerous spots that appear secondary to cumulative UV radiation exposure/sun exposure over time. They are chronic with expected duration over 1 year. A portion of actinic keratoses will progress to squamous cell carcinoma of the skin. It is not possible to reliably predict which spots will progress to skin cancer and so treatment is recommended to prevent development of skin cancer.  Recommend daily broad spectrum sunscreen SPF 30+ to sun-exposed areas, reapply every 2 hours as needed.  Recommend staying in the shade or wearing long sleeves, sun glasses (UVA+UVB protection) and wide brim hats (4-inch brim around the entire circumference of the hat). Call for new or changing lesions.   Destruction of lesion - left temple Complexity: simple   Destruction method: cryotherapy   Informed consent: discussed and consent obtained   Timeout:  patient name, date of birth, surgical site, and procedure verified Lesion destroyed using liquid nitrogen: Yes    Region frozen until ice ball extended beyond lesion: Yes   Outcome: patient tolerated procedure well with no complications   Post-procedure details: wound care instructions given    Skin cancer screening  Actinic skin damage  Lentigo  Melanocytic nevus, unspecified location  Seborrheic keratosis  History of dysplastic nevus  Actinic Damage - chronic, secondary to cumulative UV radiation exposure/sun exposure over time - diffuse scaly erythematous macules with underlying dyspigmentation - Recommend daily broad spectrum sunscreen SPF 30+ to sun-exposed areas, reapply every 2 hours as needed.  - Recommend staying in the shade or wearing long sleeves, sun glasses (UVA+UVB protection) and wide brim hats (4-inch brim around the entire circumference of the hat). - Call for new or changing lesions.   Seborrheic Keratoses - Stuck-on, waxy, tan-brown papules and/or plaques  - Benign-appearing - Discussed benign etiology and prognosis. - Observe - Call for any changes  Lentigines - Scattered tan macules - Due to sun exposure - Benign-appearing, observe - Recommend daily broad spectrum sunscreen SPF 30+ to sun-exposed areas, reapply every 2 hours as needed. - Call for any changes   History of Dysplastic Nevi/mod atypia  Left posterior shoulder 12/202/23 - No evidence of recurrence today - Recommend regular full body skin exams - Recommend daily broad spectrum sunscreen SPF 30+ to sun-exposed areas, reapply every 2 hours as needed.  - Call if any new or changing lesions are noted between office visits   Melanocytic Nevi - Tan-brown and/or pink-flesh-colored symmetric macules and papules - Benign appearing on exam today -  Observation - Call clinic for new or changing moles - Recommend daily use of broad spectrum spf 30+ sunscreen to sun-exposed areas.   Return in about 1 year (around 10/07/2023) for TBSE, hx of Dysplastic nevus .  IMarye Round, CMA, am acting as scribe for  Sarina Ser, MD .  Documentation: I have reviewed the above documentation for accuracy and completeness, and I agree with the above.  Sarina Ser, MD

## 2022-10-07 NOTE — Patient Instructions (Signed)
Due to recent changes in healthcare laws, you may see results of your pathology and/or laboratory studies on MyChart before the doctors have had a chance to review them. We understand that in some cases there may be results that are confusing or concerning to you. Please understand that not all results are received at the same time and often the doctors may need to interpret multiple results in order to provide you with the best plan of care or course of treatment. Therefore, we ask that you please give us 2 business days to thoroughly review all your results before contacting the office for clarification. Should we see a critical lab result, you will be contacted sooner.   If You Need Anything After Your Visit  If you have any questions or concerns for your doctor, please call our main line at [phone] and press option 4 to reach your doctor's medical assistant. If no one answers, please leave a voicemail as directed and we will return your call as soon as possible. Messages left after 4 pm will be answered the following business day.   You may also send us a message via MyChart. We typically respond to MyChart messages within 1-2 business days.  For prescription refills, please ask your pharmacy to contact our office. Our fax number is [phone].  If you have an urgent issue when the clinic is closed that cannot wait until the next business day, you can page your doctor at the number below.    Please note that while we do our best to be available for urgent issues outside of office hours, we are not available 24/7.   If you have an urgent issue and are unable to reach us, you may choose to seek medical care at your doctor's office, retail clinic, urgent care center, or emergency room.  If you have a medical emergency, please immediately call 911 or go to the emergency department.  Pager Numbers  - Dr. Kowalski: [phone]  - Dr. Moye: [phone]  - Dr. Stewart:  [phone]  In the event of inclement weather, please call our main line at [phone] for an update on the status of any delays or closures.  Dermatology Medication Tips: Please keep the boxes that topical medications come in in order to help keep track of the instructions about where and how to use these. Pharmacies typically print the medication instructions only on the boxes and not directly on the medication tubes.   If your medication is too expensive, please contact our office at [phone] option 4 or send us a message through MyChart.   We are unable to tell what your co-pay for medications will be in advance as this is different depending on your insurance coverage. However, we may be able to find a substitute medication at lower cost or fill out paperwork to get insurance to cover a needed medication.   If a prior authorization is required to get your medication covered by your insurance company, please allow us 1-2 business days to complete this process.  Drug prices often vary depending on where the prescription is filled and some pharmacies may offer cheaper prices.  The website www.goodrx.com contains coupons for medications through different pharmacies. The prices here do not account for what the cost may be with help from insurance (it may be cheaper with your insurance), but the website can give you the price if you did not use any insurance.  - You can print the associated coupon and take it with   your prescription to the pharmacy.  - You may also stop by our office during regular business hours and pick up a GoodRx coupon card.  - If you need your prescription sent electronically to a different pharmacy, notify our office through [facility] MyChart or by phone at [phone] option 4.     Si Usted Necesita Algo Despus de Su Visita  Tambin puede enviarnos un mensaje a travs de MyChart. Por lo general respondemos a los mensajes de MyChart en el transcurso de 1 a 2  das hbiles.  Para renovar recetas, por favor pida a su farmacia que se ponga en contacto con nuestra oficina. Nuestro nmero de fax es el [phone].  Si tiene un asunto urgente cuando la clnica est cerrada y que no puede esperar hasta el siguiente da hbil, puede llamar/localizar a su doctor(a) al nmero que aparece a continuacin.   Por favor, tenga en cuenta que aunque hacemos todo lo posible para estar disponibles para asuntos urgentes fuera del horario de oficina, no estamos disponibles las 24 horas del da, los 7 das de la semana.   Si tiene un problema urgente y no puede comunicarse con nosotros, puede optar por buscar atencin mdica  en el consultorio de su doctor(a), en una clnica privada, en un centro de atencin urgente o en una sala de emergencias.  Si tiene una emergencia mdica, por favor llame inmediatamente al 911 o vaya a la sala de emergencias.  Nmeros de bper  - Dr. Kowalski: [phone]  - Dra. Moye: [phone]  - Dra. Stewart: [phone]  En caso de inclemencias del tiempo, por favor llame a nuestra lnea principal al [phone] para una actualizacin sobre el estado de cualquier retraso o cierre.  Consejos para la medicacin en dermatologa: Por favor, guarde las cajas en las que vienen los medicamentos de uso tpico para ayudarle a seguir las instrucciones sobre dnde y cmo usarlos. Las farmacias generalmente imprimen las instrucciones del medicamento slo en las cajas y no directamente en los tubos del medicamento.   Si su medicamento es muy caro, por favor, pngase en contacto con nuestra oficina llamando al [phone] y presione la opcin 4 o envenos un mensaje a travs de MyChart.   No podemos decirle cul ser su copago por los medicamentos por adelantado ya que esto es diferente dependiendo de la cobertura de su seguro. Sin embargo, es posible que podamos encontrar un medicamento sustituto a menor costo o llenar un formulario para que el  seguro cubra el medicamento que se considera necesario.   Si se requiere una autorizacin previa para que su compaa de seguros cubra su medicamento, por favor permtanos de 1 a 2 das hbiles para completar este proceso.  Los precios de los medicamentos varan con frecuencia dependiendo del lugar de dnde se surte la receta y alguna farmacias pueden ofrecer precios ms baratos.  El sitio web www.goodrx.com tiene cupones para medicamentos de diferentes farmacias. Los precios aqu no tienen en cuenta lo que podra costar con la ayuda del seguro (puede ser ms barato con su seguro), pero el sitio web puede darle el precio si no utiliz ningn seguro.  - Puede imprimir el cupn correspondiente y llevarlo con su receta a la farmacia.  - Tambin puede pasar por nuestra oficina durante el horario de atencin regular y recoger una tarjeta de cupones de GoodRx.  - Si necesita que su receta se enve electrnicamente a una farmacia diferente, informe a nuestra oficina a travs de MyChart de    o por telfono llamando al [phone] y presione la opcin 4.  

## 2022-10-10 ENCOUNTER — Encounter: Payer: Self-pay | Admitting: Dermatology

## 2022-12-26 ENCOUNTER — Other Ambulatory Visit: Payer: Self-pay | Admitting: Family Medicine

## 2022-12-26 DIAGNOSIS — I10 Essential (primary) hypertension: Secondary | ICD-10-CM

## 2023-02-12 ENCOUNTER — Other Ambulatory Visit: Payer: Self-pay | Admitting: Family Medicine

## 2023-03-28 ENCOUNTER — Ambulatory Visit: Payer: PPO | Admitting: Family Medicine

## 2023-03-29 ENCOUNTER — Encounter: Payer: Self-pay | Admitting: Family Medicine

## 2023-03-29 ENCOUNTER — Ambulatory Visit (INDEPENDENT_AMBULATORY_CARE_PROVIDER_SITE_OTHER): Payer: PPO | Admitting: Family Medicine

## 2023-03-29 VITALS — BP 136/80 | HR 64 | Temp 97.7°F | Resp 16 | Ht 75.0 in | Wt 236.1 lb

## 2023-03-29 DIAGNOSIS — J302 Other seasonal allergic rhinitis: Secondary | ICD-10-CM | POA: Diagnosis not present

## 2023-03-29 DIAGNOSIS — I10 Essential (primary) hypertension: Secondary | ICD-10-CM

## 2023-03-29 DIAGNOSIS — F17200 Nicotine dependence, unspecified, uncomplicated: Secondary | ICD-10-CM | POA: Diagnosis not present

## 2023-03-29 DIAGNOSIS — G4733 Obstructive sleep apnea (adult) (pediatric): Secondary | ICD-10-CM | POA: Diagnosis not present

## 2023-03-29 DIAGNOSIS — K219 Gastro-esophageal reflux disease without esophagitis: Secondary | ICD-10-CM

## 2023-03-29 DIAGNOSIS — E785 Hyperlipidemia, unspecified: Secondary | ICD-10-CM | POA: Diagnosis not present

## 2023-03-29 DIAGNOSIS — Z5181 Encounter for therapeutic drug level monitoring: Secondary | ICD-10-CM

## 2023-03-29 DIAGNOSIS — Z1211 Encounter for screening for malignant neoplasm of colon: Secondary | ICD-10-CM

## 2023-03-29 DIAGNOSIS — M545 Low back pain, unspecified: Secondary | ICD-10-CM | POA: Diagnosis not present

## 2023-03-29 DIAGNOSIS — E663 Overweight: Secondary | ICD-10-CM

## 2023-03-29 MED ORDER — EZETIMIBE 10 MG PO TABS: 10.0000 mg | ORAL_TABLET | Freq: Every day | ORAL | 3 refills | Status: DC

## 2023-03-29 MED ORDER — MOMETASONE FUROATE 50 MCG/ACT NA SUSP: 2.0000 | Freq: Every day | NASAL | 12 refills | Status: DC

## 2023-03-29 MED ORDER — RYALTRIS 665-25 MCG/ACT NA SUSP
1.0000 | Freq: Two times a day (BID) | NASAL | 1 refills | Status: DC
Start: 2023-03-29 — End: 2023-10-19

## 2023-03-29 MED ORDER — ATORVASTATIN CALCIUM 80 MG PO TABS: 80.0000 mg | ORAL_TABLET | Freq: Every day | ORAL | 3 refills | Status: DC

## 2023-03-29 MED ORDER — MONTELUKAST SODIUM 10 MG PO TABS: 10.0000 mg | ORAL_TABLET | Freq: Every day | ORAL | 1 refills | Status: DC

## 2023-03-29 NOTE — Progress Notes (Signed)
Name: CHAE ROBLEY   MRN: [id number]    DOB: 1955/08/10   Date:03/29/2023       Progress Note  Chief Complaint  Patient presents with   Follow-up   Hypertension   Hyperlipidemia     Subjective:   Curtis Underwood is a 67 y.o. male, presents to clinic for routine f/up on chronic conditions  Is due for cologuard - unknown family hx pt may also want to consider doing colonoscopy  Hypertension:  Currently managed on lisinopril 10 Pt reports good med compliance and denies any SE.   Blood pressure today is slightly elevated -he did run into traffic and was concerned he would be late to the appointment feels a little stressed, home BPs have been well-controlled BP Readings from Last 3 Encounters:  03/29/23 136/80  09/27/22 126/68  07/21/22 137/83   Pt denies CP, SOB, exertional sx, LE edema, palpitation, Ha's, visual disturbances, lightheadedness, hypotension, syncope. Dietary efforts for BP?   Healthy, low salt and exercising  Hyperlipidemia: Currently treated with atorvastatin 80 mg, pt reports good med compliance Last Lipids: Lab Results  Component Value Date   CHOL 134 09/27/2022   HDL 38 (L) 09/27/2022   LDLCALC 67 09/27/2022   TRIG 249 (H) 09/27/2022   CHOLHDL 3.5 09/27/2022   - Denies: Chest pain, shortness of breath, myalgias, claudication  GERD still using omeprazole often  Allergies seem to be poorly controlled with Flonase and Singulair he asks about different nose sprays he can try  Right mid to low back pain which seem to be worse after he golfed Sunday and more sore on Monday seems reproducible like a pulled muscle does not radiate down his leg and he has no numbness tingling or weakness, he used muscle relaxer and that did seem to help   Current Outpatient Medications:    albuterol (VENTOLIN HFA) 108 (90 Base) MCG/ACT inhaler, Inhale 2 puffs into the lungs every 6 (six) hours as needed for wheezing or shortness of breath., Disp: 8 g, Rfl: 0   aspirin 81  MG EC tablet, TAKE 1 TABLET BY MOUTH EVERY DAY, Disp: 90 tablet, Rfl: 1   atorvastatin (LIPITOR) 80 MG tablet, Take 1 tablet (80 mg total) by mouth daily., Disp: 90 tablet, Rfl: 3   cetirizine (ZYRTEC) 10 MG tablet, Take 10 mg by mouth as needed for allergies., Disp: , Rfl:    ezetimibe (ZETIA) 10 MG tablet, Take 1 tablet (10 mg total) by mouth daily., Disp: 90 tablet, Rfl: 3   fluticasone (FLONASE) 50 MCG/ACT nasal spray, PLACE 2 SPRAYS INTO EACH NOSTRIL EVERY DAY, Disp: 48 mL, Rfl: 6   lisinopril (ZESTRIL) 10 MG tablet, TAKE 1 TABLET BY MOUTH EVERY DAY, Disp: 90 tablet, Rfl: 3   montelukast (SINGULAIR) 10 MG tablet, Take 1 tablet (10 mg total) by mouth at bedtime., Disp: 90 tablet, Rfl: 3   omeprazole (PRILOSEC) 20 MG capsule, TAKE 1 CAPSULE BY MOUTH DAILY AS NEEDED, Disp: 90 capsule, Rfl: 1   tiZANidine (ZANAFLEX) 4 MG tablet, TAKE 1 TABLET (4 MG TOTAL) BY MOUTH DAILY AS NEEDED FOR MUSCLE SPASMS. AS NEEDED FOR MUSCLE SPASMS, Disp: 90 tablet, Rfl: 0  Patient Active Problem List   Diagnosis Date Noted   Primary hypertension 01/21/2022   Vitamin D insufficiency 01/31/2020   Overweight (BMI 25.0-29.9) 07/24/2018   History of transient ischemic attack (TIA) 04/14/2018   Gastroesophageal reflux disease without esophagitis 05/26/2015   Seasonal allergic rhinitis 05/26/2015   Dyslipidemia 05/26/2015  Smoker 05/26/2015   Obstructive sleep apnea 05/26/2015    Past Surgical History:  Procedure Laterality Date   APPENDECTOMY      Family History  Adopted: Yes  Family history unknown: Yes    Social History   Tobacco Use   Smoking status: Some Days    Types: Cigars   Smokeless tobacco: Never  Vaping Use   Vaping status: Never Used  Substance Use Topics   Alcohol use: Yes    Alcohol/week: 4.0 standard drinks of alcohol    Types: 4 Cans of beer per week    Comment: 3-4 beers   Drug use: No     No Known Allergies  Health Maintenance  Topic Date Due   Fecal DNA (Cologuard)   02/18/2023   COVID-19 Vaccine (3 - Pfizer risk series) 04/14/2023 (Originally 11/27/2019)   INFLUENZA VACCINE  10/31/2023 (Originally 03/03/2023)   Medicare Annual Wellness (AWV)  08/11/2023   DTaP/Tdap/Td (2 - Td or Tdap) 07/24/2028   Pneumonia Vaccine 38+ Years old  Completed   Hepatitis C Screening  Completed   Zoster Vaccines- Shingrix  Completed   HPV VACCINES  Aged Out    Chart Review Today: I personally reviewed active problem list, medication list, allergies, family history, social history, health maintenance, notes from last encounter, lab results, imaging with the patient/caregiver today.\  Review of Systems  Constitutional: Negative.   HENT: Negative.    Eyes: Negative.   Respiratory: Negative.    Cardiovascular: Negative.   Gastrointestinal: Negative.   Endocrine: Negative.   Genitourinary: Negative.   Musculoskeletal: Negative.   Skin: Negative.   Allergic/Immunologic: Negative.   Neurological: Negative.   Hematological: Negative.   Psychiatric/Behavioral: Negative.    All other systems reviewed and are negative.    Objective:   Vitals:   03/29/23 1043  BP: 136/80  Pulse: 64  Resp: 16  Temp: 97.7 F (36.5 C)  TempSrc: Oral  SpO2: 96%  Weight: 236 lb 1.6 oz (107.1 kg)  Height: 6\' 3"  (1.905 m)    Body mass index is 29.51 kg/m.  Physical Exam Vitals and nursing note reviewed.  Constitutional:      General: He is not in acute distress.    Appearance: Normal appearance. He is well-developed and well-groomed. He is not ill-appearing, toxic-appearing or diaphoretic.  HENT:     Head: Normocephalic and atraumatic.     Jaw: No trismus.     Right Ear: External ear normal.     Left Ear: External ear normal.     Nose: Congestion present.  Eyes:     General: Lids are normal. No scleral icterus.       Right eye: No discharge.        Left eye: No discharge.     Conjunctiva/sclera: Conjunctivae normal.  Neck:     Trachea: Trachea and phonation normal. No  tracheal deviation.  Cardiovascular:     Rate and Rhythm: Normal rate and regular rhythm.     Pulses: Normal pulses.          Radial pulses are 2+ on the right side and 2+ on the left side.       Posterior tibial pulses are 2+ on the right side and 2+ on the left side.     Heart sounds: Normal heart sounds. No murmur heard.    No friction rub. No gallop.  Pulmonary:     Effort: Pulmonary effort is normal. No respiratory distress.     Breath sounds: Normal  breath sounds. No stridor. No wheezing, rhonchi or rales.  Abdominal:     General: Bowel sounds are normal. There is no distension.     Palpations: Abdomen is soft.  Musculoskeletal:        General: No tenderness or deformity.     Cervical back: Normal and normal range of motion. No rigidity. Normal range of motion.     Thoracic back: No swelling, edema, deformity, spasms, tenderness or bony tenderness. Normal range of motion.     Lumbar back: No swelling, edema, deformity, tenderness or bony tenderness. Normal range of motion.     Right lower leg: No edema.     Left lower leg: No edema.  Skin:    General: Skin is warm and dry.     Coloration: Skin is not jaundiced.     Findings: No rash.     Nails: There is no clubbing.  Neurological:     Mental Status: He is alert. Mental status is at baseline.     Cranial Nerves: No dysarthria or facial asymmetry.     Motor: No tremor or abnormal muscle tone.     Gait: Gait normal.  Psychiatric:        Attention and Perception: Attention normal.        Mood and Affect: Mood normal.        Speech: Speech normal.        Behavior: Behavior is cooperative.         Assessment & Plan:   Problem List Items Addressed This Visit       Cardiovascular and Mediastinum   Primary hypertension - Primary    Pt has been stable with BP at goal with diet/lifestyle efforts and lisinopril 10 mg daily Slightly higher today but pt was rushed and a little stressed about getting to the appt Will keep  dose the same for now and monitor, continue lisinopril 10 and DASH BP Readings from Last 3 Encounters:  03/29/23 136/80  09/27/22 126/68  07/21/22 137/83         Relevant Medications   ezetimibe (ZETIA) 10 MG tablet   atorvastatin (LIPITOR) 80 MG tablet   Other Relevant Orders   BASIC METABOLIC PANEL WITH GFR     Respiratory   Seasonal allergic rhinitis    Not well controlled Encouraged patient to try different nasal sprays such as Nasonex or Nasacort he could also add or change over-the-counter second-generation antihistamines Will see if we can get ryaltris rx for him      Relevant Medications   montelukast (SINGULAIR) 10 MG tablet   Olopatadine-Mometasone (RYALTRIS) 665-25 MCG/ACT SUSP   Obstructive sleep apnea    Compliant with CPAP        Digestive   Gastroesophageal reflux disease without esophagitis    On prilosec long term for tx, previously encouraged to wean off PPI if able He states he was not been able to wean off 10 to have indigestion and belching Encouraged him to still try famotidine or taking breaks from omeprazole No concerning symptoms or red flags         Other   Dyslipidemia    Lab Results  Component Value Date   CHOL 134 09/27/2022   HDL 38 (L) 09/27/2022   LDLCALC 67 09/27/2022   TRIG 249 (H) 09/27/2022   CHOLHDL 3.5 09/27/2022  LDL was at goal Pt on 80 mg atorvastatin       Relevant Medications   ezetimibe (ZETIA) 10 MG tablet  atorvastatin (LIPITOR) 80 MG tablet   Other Relevant Orders   BASIC METABOLIC PANEL WITH GFR   Smoker    cigars occasionally       Other Visit Diagnoses     Encounter for medication monitoring       Relevant Orders   BASIC METABOLIC PANEL WITH GFR   Screening for malignant neoplasm of colon       Due for Cologuard, no red flags or symptoms no new pertinent family history   Relevant Orders   Cologuard   Acute right-sided low back pain without sciatica       lower thoracic to upper lumbar, exam  unremarkable, discussed conservative measures and Pt referral - he declines PT right now        Return in about 6 months (around 09/29/2023) for Annual Physical.   Danelle Berry, PA-C 03/29/23 10:55 AM

## 2023-03-29 NOTE — Assessment & Plan Note (Signed)
Compliant with CPAP 

## 2023-03-29 NOTE — Assessment & Plan Note (Addendum)
On prilosec long term for tx, previously encouraged to wean off PPI if able He states he was not been able to wean off 10 to have indigestion and belching Encouraged him to still try famotidine or taking breaks from omeprazole No concerning symptoms or red flags

## 2023-03-29 NOTE — Assessment & Plan Note (Signed)
cigars occasionally

## 2023-03-29 NOTE — Assessment & Plan Note (Addendum)
Pt has been stable with BP at goal with diet/lifestyle efforts and lisinopril 10 mg daily Slightly higher today but pt was rushed and a little stressed about getting to the appt Will keep dose the same for now and monitor, continue lisinopril 10 and DASH BP Readings from Last 3 Encounters:  03/29/23 136/80  09/27/22 126/68  07/21/22 137/83

## 2023-03-29 NOTE — Assessment & Plan Note (Signed)
Not well controlled Encouraged patient to try different nasal sprays such as Nasonex or Nasacort he could also add or change over-the-counter second-generation antihistamines Will see if we can get ryaltris rx for him

## 2023-03-29 NOTE — Assessment & Plan Note (Signed)
Lab Results  Component Value Date   CHOL 134 09/27/2022   HDL 38 (L) 09/27/2022   LDLCALC 67 09/27/2022   TRIG 249 (H) 09/27/2022   CHOLHDL 3.5 09/27/2022  LDL was at goal Pt on 80 mg atorvastatin

## 2023-03-30 LAB — BASIC METABOLIC PANEL WITH GFR
BUN: 14 mg/dL (ref 7–25)
CO2: 26 mmol/L (ref 20–32)
Calcium: 9.3 mg/dL (ref 8.6–10.3)
Chloride: 105 mmol/L (ref 98–110)
Creat: 0.95 mg/dL (ref 0.70–1.35)
Glucose, Bld: 105 mg/dL — ABNORMAL HIGH (ref 65–99)
Potassium: 4.7 mmol/L (ref 3.5–5.3)
Sodium: 140 mmol/L (ref 135–146)
eGFR: 88 mL/min/{1.73_m2} (ref >=60)

## 2023-04-04 DIAGNOSIS — Z1211 Encounter for screening for malignant neoplasm of colon: Secondary | ICD-10-CM | POA: Diagnosis not present

## 2023-04-09 LAB — COLOGUARD: COLOGUARD: NEGATIVE

## 2023-04-11 ENCOUNTER — Encounter: Payer: Self-pay | Admitting: Pharmacist

## 2023-04-11 NOTE — Progress Notes (Signed)
   04/11/2023  Patient ID: Curtis Underwood, male   DOB: [birth year], 67 y.o.   MRN: [id number]  Pharmacy Quality Measure Review  This patient is appearing on a report for the adherence measure for cholesterol (statin) medications this calendar year.   Medication: atorvastatin 80 mg Last fill date: 03/29/2023 for 90 day supply  [employer] report was not up to date. No action needed at this time.   Estelle Grumbles, PharmD, [facility] [phone]

## 2023-05-02 ENCOUNTER — Other Ambulatory Visit: Payer: Self-pay | Admitting: Nurse Practitioner

## 2023-05-02 ENCOUNTER — Encounter: Payer: Self-pay | Admitting: Family Medicine

## 2023-05-02 DIAGNOSIS — J302 Other seasonal allergic rhinitis: Secondary | ICD-10-CM

## 2023-05-02 MED ORDER — FLUTICASONE PROPIONATE 50 MCG/ACT NA SUSP: 2.0000 | Freq: Every day | NASAL | 6 refills | Status: DC

## 2023-05-10 ENCOUNTER — Telehealth: Payer: PPO | Admitting: Physician Assistant

## 2023-05-10 DIAGNOSIS — U071 COVID-19: Secondary | ICD-10-CM | POA: Diagnosis not present

## 2023-05-10 MED ORDER — ALBUTEROL SULFATE HFA 108 (90 BASE) MCG/ACT IN AERS
2.0000 | INHALATION_SPRAY | Freq: Four times a day (QID) | RESPIRATORY_TRACT | 0 refills | Status: AC | PRN
Start: 2023-05-10 — End: ?

## 2023-05-10 MED ORDER — PROMETHAZINE-DM 6.25-15 MG/5ML PO SYRP: 5.0000 mL | ORAL_SOLUTION | Freq: Four times a day (QID) | ORAL | 0 refills | Status: DC | PRN

## 2023-05-10 MED ORDER — NIRMATRELVIR/RITONAVIR (PAXLOVID)TABLET: 3.0000 | ORAL_TABLET | Freq: Two times a day (BID) | ORAL | 0 refills | Status: AC

## 2023-05-10 NOTE — Patient Instructions (Signed)
Montey Hora, thank you for joining Piedad Climes, PA-C for today's virtual visit.  While this provider is not your primary care provider (PCP), if your PCP is located in our provider database this encounter information will be shared with them immediately following your visit.   A [facility] MyChart account gives you access to today's visit and all your visits, tests, and labs performed at [facility] " click here if you don't have a [facility] MyChart account or go to [email or web address]  Consent: (Patient) Curtis Underwood provided verbal consent for this virtual visit at the beginning of the encounter.  Current Medications:  Current Outpatient Medications:    albuterol (VENTOLIN HFA) 108 (90 Base) MCG/ACT inhaler, Inhale 2 puffs into the lungs every 6 (six) hours as needed for wheezing or shortness of breath., Disp: 8 g, Rfl: 0   aspirin 81 MG EC tablet, TAKE 1 TABLET BY MOUTH EVERY DAY, Disp: 90 tablet, Rfl: 1   atorvastatin (LIPITOR) 80 MG tablet, Take 1 tablet (80 mg total) by mouth daily., Disp: 90 tablet, Rfl: 3   cetirizine (ZYRTEC) 10 MG tablet, Take 10 mg by mouth as needed for allergies., Disp: , Rfl:    ezetimibe (ZETIA) 10 MG tablet, Take 1 tablet (10 mg total) by mouth daily., Disp: 90 tablet, Rfl: 3   fluticasone (FLONASE) 50 MCG/ACT nasal spray, Place 2 sprays into both nostrils daily., Disp: 16 g, Rfl: 6   lisinopril (ZESTRIL) 10 MG tablet, TAKE 1 TABLET BY MOUTH EVERY DAY, Disp: 90 tablet, Rfl: 3   mometasone (NASONEX) 50 MCG/ACT nasal spray, Place 2 sprays into the nose daily., Disp: 1 each, Rfl: 12   montelukast (SINGULAIR) 10 MG tablet, Take 1 tablet (10 mg total) by mouth at bedtime., Disp: 90 tablet, Rfl: 1   Olopatadine-Mometasone (RYALTRIS) 665-25 MCG/ACT SUSP, Place 1 spray into the nose in the morning and at bedtime., Disp: 29 g, Rfl: 1   omeprazole (PRILOSEC) 20 MG capsule, TAKE 1 CAPSULE BY MOUTH DAILY AS NEEDED, Disp: 90 capsule, Rfl:  1   tiZANidine (ZANAFLEX) 4 MG tablet, TAKE 1 TABLET (4 MG TOTAL) BY MOUTH DAILY AS NEEDED FOR MUSCLE SPASMS. AS NEEDED FOR MUSCLE SPASMS, Disp: 90 tablet, Rfl: 0   Medications ordered in this encounter:  No orders of the defined types were placed in this encounter.    *If you need refills on other medications prior to your next appointment, please contact your pharmacy*  Follow-Up: Call back or seek an in-person evaluation if the symptoms worsen or if the condition fails to improve as anticipated.   Virtual Care [phone]  Care Instructions: Please keep well-hydrated and get plenty of rest. Start a saline nasal rinse to flush out your nasal passages. You can use plain Mucinex to help thin congestion. If you have a humidifier, running in the bedroom at night. I want you to start OTC vitamin D3 1000 units daily, vitamin C 1000 mg daily, and a zinc supplement. Please take prescribed medications as directed.  Hold off on taking your Atorvastatin while on the Paxlovid.   Isolation Instructions: You are to isolate at home until you have been fever free for at least 24 hours without a fever-reducing medication, and symptoms have been steadily improving for 24 hours. At that time,  you can end isolation but need to mask for an additional 5 days.   If you must be around other household members who do not have symptoms, you need to  make sure that both you and the family members are masking consistently with a high-quality mask.  If you note any worsening of symptoms despite treatment, please seek an in-person evaluation ASAP. If you note any significant shortness of breath or any chest pain, please seek ER evaluation. Please do not delay care!   COVID-19: What to Do if You Are Sick If you test positive and are an older adult or someone who is at high risk of getting very sick from COVID-19, treatment may be available. Contact a healthcare provider right away after a positive  test to determine if you are eligible, even if your symptoms are mild right now. You can also visit a Test to Treat location and, if eligible, receive a prescription from a provider. Don't delay: Treatment must be started within the first few days to be effective. If you have a fever, cough, or other symptoms, you might have COVID-19. Most people have mild illness and are able to recover at home. If you are sick: Keep track of your symptoms. If you have an emergency warning sign (including trouble breathing), call 911. Steps to help prevent the spread of COVID-19 if you are sick If you are sick with COVID-19 or think you might have COVID-19, follow the steps below to care for yourself and to help protect other people in your home and community. Stay home except to get medical care Stay home. Most people with COVID-19 have mild illness and can recover at home without medical care. Do not leave your home, except to get medical care. Do not visit public areas and do not go to places where you are unable to wear a mask. Take care of yourself. Get rest and stay hydrated. Take over-the-counter medicines, such as acetaminophen, to help you feel better. Stay in touch with your doctor. Call before you get medical care. Be sure to get care if you have trouble breathing, or have any other emergency warning signs, or if you think it is an emergency. Avoid public transportation, ride-sharing, or taxis if possible. Get tested If you have symptoms of COVID-19, get tested. While waiting for test results, stay away from others, including staying apart from those living in your household. Get tested as soon as possible after your symptoms start. Treatments may be available for people with COVID-19 who are at risk for becoming very sick. Don't delay: Treatment must be started early to be effective--some treatments must begin within 5 days of your first symptoms. Contact your healthcare provider right away if your test  result is positive to determine if you are eligible. Self-tests are one of several options for testing for the virus that causes COVID-19 and may be more convenient than laboratory-based tests and point-of-care tests. Ask your healthcare provider or your local health department if you need help interpreting your test results. You can visit your state, tribal, local, and territorial health department's website to look for the latest local information on testing sites. Separate yourself from other people As much as possible, stay in a specific room and away from other people and pets in your home. If possible, you should use a separate bathroom. If you need to be around other people or animals in or outside of the home, wear a well-fitting mask. Tell your close contacts that they may have been exposed to COVID-19. An infected person can spread COVID-19 starting 48 hours (or 2 days) before the person has any symptoms or tests positive. By letting your close contacts  know they may have been exposed to COVID-19, you are helping to protect everyone. See COVID-19 and Animals if you have questions about pets. If you are diagnosed with COVID-19, someone from the health department may call you. Answer the call to slow the spread. Monitor your symptoms Symptoms of COVID-19 include fever, cough, or other symptoms. Follow care instructions from your healthcare provider and local health department. Your local health authorities may give instructions on checking your symptoms and reporting information. When to seek emergency medical attention Look for emergency warning signs* for COVID-19. If someone is showing any of these signs, seek emergency medical care immediately: Trouble breathing Persistent pain or pressure in the chest New confusion Inability to wake or stay awake Pale, gray, or blue-colored skin, lips, or nail beds, depending on skin tone *This list is not all possible symptoms. Please call your  medical provider for any other symptoms that are severe or concerning to you. Call 911 or call ahead to your local emergency facility: Notify the operator that you are seeking care for someone who has or may have COVID-19. Call ahead before visiting your doctor Call ahead. Many medical visits for routine care are being postponed or done by phone or telemedicine. If you have a medical appointment that cannot be postponed, call your doctor's office, and tell them you have or may have COVID-19. This will help the office protect themselves and other patients. If you are sick, wear a well-fitting mask You should wear a mask if you must be around other people or animals, including pets (even at home). Wear a mask with the best fit, protection, and comfort for you. You don't need to wear the mask if you are alone. If you can't put on a mask (because of trouble breathing, for example), cover your coughs and sneezes in some other way. Try to stay at least 6 feet away from other people. This will help protect the people around you. Masks should not be placed on young children under age 39 years, anyone who has trouble breathing, or anyone who is not able to remove the mask without help. Cover your coughs and sneezes Cover your mouth and nose with a tissue when you cough or sneeze. Throw away used tissues in a lined trash can. Immediately wash your hands with soap and water for at least 20 seconds. If soap and water are not available, clean your hands with an alcohol-based hand sanitizer that contains at least 60% alcohol. Clean your hands often Wash your hands often with soap and water for at least 20 seconds. This is especially important after blowing your nose, coughing, or sneezing; going to the bathroom; and before eating or preparing food. Use hand sanitizer if soap and water are not available. Use an alcohol-based hand sanitizer with at least 60% alcohol, covering all surfaces of your hands and rubbing  them together until they feel dry. Soap and water are the best option, especially if hands are visibly dirty. Avoid touching your eyes, nose, and mouth with unwashed hands. Handwashing Tips Avoid sharing personal household items Do not share dishes, drinking glasses, cups, eating utensils, towels, or bedding with other people in your home. Wash these items thoroughly after using them with soap and water or put in the dishwasher. Clean surfaces in your home regularly Clean and disinfect high-touch surfaces (for example, doorknobs, tables, handles, light switches, and countertops) in your "sick room" and bathroom. In shared spaces, you should clean and disinfect surfaces and items after  each use by the person who is ill. If you are sick and cannot clean, a caregiver or other person should only clean and disinfect the area around you (such as your bedroom and bathroom) on an as needed basis. Your caregiver/other person should wait as long as possible (at least several hours) and wear a mask before entering, cleaning, and disinfecting shared spaces that you use. Clean and disinfect areas that may have blood, stool, or body fluids on them. Use household cleaners and disinfectants. Clean visible dirty surfaces with household cleaners containing soap or detergent. Then, use a household disinfectant. Use a product from [employer] List N: Disinfectants for Coronavirus (COVID-19). Be sure to follow the instructions on the label to ensure safe and effective use of the product. Many products recommend keeping the surface wet with a disinfectant for a certain period of time (look at "contact time" on the product label). You may also need to wear personal protective equipment, such as gloves, depending on the directions on the product label. Immediately after disinfecting, wash your hands with soap and water for 20 seconds. For completed guidance on cleaning and disinfecting your home, visit Complete Disinfection  Guidance. Take steps to improve ventilation at home Improve ventilation (air flow) at home to help prevent from spreading COVID-19 to other people in your household. Clear out COVID-19 virus particles in the air by opening windows, using air filters, and turning on fans in your home. Use this interactive tool to learn how to improve air flow in your home. When you can be around others after being sick with COVID-19 Deciding when you can be around others is different for different situations. Find out when you can safely end home isolation. For any additional questions about your care, contact your healthcare provider or state or local health department. 10/21/2020 Content source: [facility] ([facility]), Division of Viral Diseases This information is not intended to replace advice given to you by your health care provider. Make sure you discuss any questions you have with your health care provider. Document Revised: 12/04/2020 Document Reviewed: 12/04/2020 Elsevier Patient Education  2022 [employer].  If you have been instructed to have an in-person evaluation today at a local Urgent Care facility, please use the link below. It will take you to a list of all of our available [facility], including address, phone number and hours of operation. Please do not delay care.  [facility]  If you or a family member do not have a primary care provider, use the link below to schedule a visit and establish care. When you choose a [facility] primary care physician or advanced practice provider, you gain a long-term partner in health. Find a Primary Care Provider  Learn more about [facility]'s in-office and virtual care options: [facility] - Get Care Now

## 2023-05-10 NOTE — Progress Notes (Signed)
Virtual Visit Consent   Curtis Underwood, you are scheduled for a virtual visit with a [facility] provider today. Just as with appointments in the office, your consent must be obtained to participate. Your consent will be active for this visit and any virtual visit you may have with one of our providers in the next 365 days. If you have a MyChart account, a copy of this consent can be sent to you electronically.  As this is a virtual visit, video technology does not allow for your provider to perform a traditional examination. This may limit your provider's ability to fully assess your condition. If your provider identifies any concerns that need to be evaluated in person or the need to arrange testing (such as labs, EKG, etc.), we will make arrangements to do so. Although advances in technology are sophisticated, we cannot ensure that it will always work on either your end or our end. If the connection with a video visit is poor, the visit may have to be switched to a telephone visit. With either a video or telephone visit, we are not always able to ensure that we have a secure connection.  By engaging in this virtual visit, you consent to the provision of healthcare and authorize for your insurance to be billed (if applicable) for the services provided during this visit. Depending on your insurance coverage, you may receive a charge related to this service.  I need to obtain your verbal consent now. Are you willing to proceed with your visit today? Curtis Underwood has provided verbal consent on 05/10/2023 for a virtual visit (video or telephone). Piedad Climes, [place]  Date: 05/10/2023 8:09 AM  Virtual Visit via Video Note   I, Piedad Climes, connected with  Curtis Underwood  ([id number], 15-Jul-1956) on 05/10/23 at  9:00 AM EDT by a video-enabled telemedicine application and verified that I am speaking with the correct person using two identifiers.  Location: Patient: Virtual Visit Location  Patient: Home Provider: Virtual Visit Location Provider: Home Office   I discussed the limitations of evaluation and management by telemedicine and the availability of in person appointments. The patient expressed understanding and agreed to proceed.    History of Present Illness: Curtis Underwood is a 67 y.o. who identifies as a male who was assigned male at birth, and is being seen today for COVID-19. Endorses symptoms starting yesterday into last night with fatigue, chest congestion, cough that is persistent. Denies fever, chills. Noting increased nasal congestion and post-nasal drip currently. Cough is dry but again persistent and hard to get it to stop. This morning with some mild aches. Tested positive on home test.  Wife with symptoms starting Saturday morning and tested and positive.   HPI: HPI  Problems:  Patient Active Problem List   Diagnosis Date Noted   Primary hypertension 01/21/2022   Vitamin D insufficiency 01/31/2020   Overweight (BMI 25.0-29.9) 07/24/2018   History of transient ischemic attack (TIA) 04/14/2018   Gastroesophageal reflux disease without esophagitis 05/26/2015   Seasonal allergic rhinitis 05/26/2015   Dyslipidemia 05/26/2015   Smoker 05/26/2015   Obstructive sleep apnea 05/26/2015    Allergies: No Known Allergies Medications:  Current Outpatient Medications:    albuterol (VENTOLIN HFA) 108 (90 Base) MCG/ACT inhaler, Inhale 2 puffs into the lungs every 6 (six) hours as needed for wheezing or shortness of breath., Disp: 8 g, Rfl: 0   nirmatrelvir/ritonavir (PAXLOVID) 20 x 150 MG & 10 x 100MG  TABS, Take  3 tablets by mouth 2 (two) times daily for 5 days. (Take nirmatrelvir 150 mg two tablets twice daily for 5 days and ritonavir 100 mg one tablet twice daily for 5 days) Patient GFR is 88, Disp: 30 tablet, Rfl: 0   promethazine-dextromethorphan (PROMETHAZINE-DM) 6.25-15 MG/5ML syrup, Take 5 mLs by mouth 4 (four) times daily as needed for cough., Disp: 118 mL, Rfl:  0   aspirin 81 MG EC tablet, TAKE 1 TABLET BY MOUTH EVERY DAY, Disp: 90 tablet, Rfl: 1   atorvastatin (LIPITOR) 80 MG tablet, Take 1 tablet (80 mg total) by mouth daily., Disp: 90 tablet, Rfl: 3   cetirizine (ZYRTEC) 10 MG tablet, Take 10 mg by mouth as needed for allergies., Disp: , Rfl:    ezetimibe (ZETIA) 10 MG tablet, Take 1 tablet (10 mg total) by mouth daily., Disp: 90 tablet, Rfl: 3   fluticasone (FLONASE) 50 MCG/ACT nasal spray, Place 2 sprays into both nostrils daily., Disp: 16 g, Rfl: 6   lisinopril (ZESTRIL) 10 MG tablet, TAKE 1 TABLET BY MOUTH EVERY DAY, Disp: 90 tablet, Rfl: 3   mometasone (NASONEX) 50 MCG/ACT nasal spray, Place 2 sprays into the nose daily., Disp: 1 each, Rfl: 12   montelukast (SINGULAIR) 10 MG tablet, Take 1 tablet (10 mg total) by mouth at bedtime., Disp: 90 tablet, Rfl: 1   Olopatadine-Mometasone (RYALTRIS) 665-25 MCG/ACT SUSP, Place 1 spray into the nose in the morning and at bedtime., Disp: 29 g, Rfl: 1   omeprazole (PRILOSEC) 20 MG capsule, TAKE 1 CAPSULE BY MOUTH DAILY AS NEEDED, Disp: 90 capsule, Rfl: 1   tiZANidine (ZANAFLEX) 4 MG tablet, TAKE 1 TABLET (4 MG TOTAL) BY MOUTH DAILY AS NEEDED FOR MUSCLE SPASMS. AS NEEDED FOR MUSCLE SPASMS, Disp: 90 tablet, Rfl: 0  Observations/Objective: Patient is well-developed, well-nourished in no acute distress.  Resting comfortably at home.  Head is normocephalic, atraumatic.  No labored breathing. Speech is clear and coherent with logical content.  Patient is alert and oriented at baseline.   Assessment and Plan: 1. COVID-19 - albuterol (VENTOLIN HFA) 108 (90 Base) MCG/ACT inhaler; Inhale 2 puffs into the lungs every 6 (six) hours as needed for wheezing or shortness of breath.  Dispense: 8 g; Refill: 0 - nirmatrelvir/ritonavir (PAXLOVID) 20 x 150 MG & 10 x 100MG  TABS; Take 3 tablets by mouth 2 (two) times daily for 5 days. (Take nirmatrelvir 150 mg two tablets twice daily for 5 days and ritonavir 100 mg one tablet  twice daily for 5 days) Patient GFR is 88  Dispense: 30 tablet; Refill: 0 - promethazine-dextromethorphan (PROMETHAZINE-DM) 6.25-15 MG/5ML syrup; Take 5 mLs by mouth 4 (four) times daily as needed for cough.  Dispense: 118 mL; Refill: 0  Patient with multiple risk factors for complicated course of illness. Discussed risks/benefits of antiviral medications including most common potential ADRs. Patient voiced understanding and would like to proceed with antiviral medication. They are candidate for Paxlovid -- he will hold his statin medication while taking. Rx sent to pharmacy. Supportive measures, OTC medications and vitamin regimen reviewed. Promethazine-DM and albuterol per orders. Quarantine reviewed in detail. Strict ER precautions discussed with patient.    Follow Up Instructions: I discussed the assessment and treatment plan with the patient. The patient was provided an opportunity to ask questions and all were answered. The patient agreed with the plan and demonstrated an understanding of the instructions.  A copy of instructions were sent to the patient via MyChart unless otherwise noted below.  The patient was advised to call back or seek an in-person evaluation if the symptoms worsen or if the condition fails to improve as anticipated.  Time:  I spent 10 minutes with the patient via telehealth technology discussing the above problems/concerns.    Piedad Climes, PA-C

## 2023-05-12 ENCOUNTER — Encounter: Payer: Self-pay | Admitting: Family Medicine

## 2023-05-12 ENCOUNTER — Telehealth: Payer: PPO | Admitting: Physician Assistant

## 2023-05-12 DIAGNOSIS — U071 COVID-19: Secondary | ICD-10-CM | POA: Diagnosis not present

## 2023-05-12 NOTE — Progress Notes (Signed)
Virtual Visit via Video Note  I connected with Curtis Underwood on 05/12/23 at 10:40 AM EDT by a video enabled telemedicine application and verified that I am speaking with the correct person using two identifiers.  Today's Provider: Jacquelin Hawking, MHS, PA-C Introduced myself to the patient as a PA-C and provided education on APPs in clinical practice.   Location: Patient: at home  Provider: [facility]    I discussed the limitations of evaluation and management by telemedicine and the availability of in person appointments. The patient expressed understanding and agreed to proceed.  Chief Complaint  Patient presents with   Covid Positive   Cough    Coughing up green mucus    History of Present Illness:   Patient was found to be COVID positive with home test on 05/10/23  Symptoms started 05/09/23 per chart review of virtual visit on 05/10/23. His wife was also having symptoms and tested positive for COVID He was started on Paxlovid, Promethazine cough syrup, albuterol inhaler at 05/10/23 apt  Reviewed medication adjustments and video visit notes  Today patient reports he is taking Paxlovid as directed but reports concerns for congestion and productive cough   He reports he is feeling better today and feels like the Paxlovid is helping with symptoms   He has had a lot of congestion and notes he has been coughing up green colored phlegm that started yesterday He reports he did have low pain in lower back since before his apt and is concerned that green phlegm and back pain may indicate infection  He reports he is still having low grade pain in his back but has not thought about it much since getting COVID He reports being able to sleep last night well and feels refreshed today  Thinks he has had low grade subjective fever and has felt chills as well    Review of Systems  Constitutional:  Positive for malaise/fatigue. Negative for chills and fever.  HENT:  Positive  for congestion.   Respiratory:  Positive for cough. Negative for shortness of breath and wheezing.   Musculoskeletal:  Negative for back pain.  Neurological:  Positive for headaches.     Observations/Objective:  Due to the nature of the virtual visit, physical exam and observations are limited. Able to obtain the following observations:   Alert, oriented, x3 Appears comfortable, in no acute distress.  No scleral injection, no appreciated hoarseness, tachypnea, wheeze or strider. Able to maintain conversation without visible strain.  No cough appreciated during visit.   Assessment and Plan:   Problem List Items Addressed This Visit   None Visit Diagnoses     COVID-19    -  Primary Acute, ongoing concern He reports productive cough, congestion, fatigue, low back pain  He has been started on Paxlovid and is taking as directed with maintenance medications adjusted as directed  He has been taking Mucinex, tylenol and promethazine, albuterol inhaler as directed as well Recommend he continues with current regimen He notes low back pain and is concerned for green mucus with productive coughing- reviewed that productive cough is likely secondary to COVID infection but if this continues after 5-7 days we can send for CXR for rule out Reviewed ED and return precautions Follow up as needed for persistent or progressing symptoms        Follow Up Instructions:    I discussed the assessment and treatment plan with the patient. The patient was provided an opportunity to ask  questions and all were answered. The patient agreed with the plan and demonstrated an understanding of the instructions.   The patient was advised to call back or seek an in-person evaluation if the symptoms worsen or if the condition fails to improve as anticipated.  I provided 10 minutes of non-face-to-face time during this encounter.   No follow-ups on file.   I, Karthikeya Funke E Breyden Jeudy, PA-C, have reviewed all  documentation for this visit. The documentation on 05/12/23 for the exam, diagnosis, procedures, and orders are all accurate and complete.   Jacquelin Hawking, MHS, PA-C [facility]

## 2023-05-24 ENCOUNTER — Ambulatory Visit: Payer: PPO | Admitting: Physician Assistant

## 2023-05-24 ENCOUNTER — Encounter: Payer: Self-pay | Admitting: Physician Assistant

## 2023-05-24 VITALS — BP 128/70 | HR 69 | Temp 97.1°F | Resp 16 | Ht 75.0 in | Wt 236.0 lb

## 2023-05-24 DIAGNOSIS — U071 COVID-19: Secondary | ICD-10-CM

## 2023-05-24 DIAGNOSIS — R051 Acute cough: Secondary | ICD-10-CM

## 2023-05-24 NOTE — Progress Notes (Signed)
Acute Office Visit   Patient: Curtis Underwood   DOB: 09-26-1955   67 y.o. Male  MRN: [id number] Visit Date: 05/24/2023  Today's healthcare provider: Oswaldo Conroy Yobani Schertzer, PA-C  Introduced myself to the patient as a Secondary school teacher and provided education on APPs in clinical practice.    Chief Complaint  Patient presents with   Cough    Green mucus, pt had COVID previously   Back Pain    Pt concerned if possible pneumonia due to having some ache on back left side   Subjective    HPI HPI     Cough    Additional comments: Green mucus, pt had COVID previously        Back Pain    Additional comments: Pt concerned if possible pneumonia due to having some ache on back left side      Last edited by Forde Radon, CMA on 05/24/2023  9:52 AM.      Recovery from COVID  He was seen virtually for this on 05/12/23 - was started on Paxlovid and was taking OTC symptomatic meds as well  He is still coughing up green phlegm but this has improved He also reports low grade pain in his right back area Reports productive coughing is predominantly in the AM after waking up  He is no longer taking any of the OTC meds    Medications: Outpatient Medications Prior to Visit  Medication Sig   albuterol (VENTOLIN HFA) 108 (90 Base) MCG/ACT inhaler Inhale 2 puffs into the lungs every 6 (six) hours as needed for wheezing or shortness of breath.   aspirin 81 MG EC tablet TAKE 1 TABLET BY MOUTH EVERY DAY   atorvastatin (LIPITOR) 80 MG tablet Take 1 tablet (80 mg total) by mouth daily.   cetirizine (ZYRTEC) 10 MG tablet Take 10 mg by mouth as needed for allergies.   ezetimibe (ZETIA) 10 MG tablet Take 1 tablet (10 mg total) by mouth daily.   fluticasone (FLONASE) 50 MCG/ACT nasal spray Place 2 sprays into both nostrils daily.   lisinopril (ZESTRIL) 10 MG tablet TAKE 1 TABLET BY MOUTH EVERY DAY   mometasone (NASONEX) 50 MCG/ACT nasal spray Place 2 sprays into the nose daily.   montelukast  (SINGULAIR) 10 MG tablet Take 1 tablet (10 mg total) by mouth at bedtime.   Olopatadine-Mometasone (RYALTRIS) X543819 MCG/ACT SUSP Place 1 spray into the nose in the morning and at bedtime.   omeprazole (PRILOSEC) 20 MG capsule TAKE 1 CAPSULE BY MOUTH DAILY AS NEEDED   tiZANidine (ZANAFLEX) 4 MG tablet TAKE 1 TABLET (4 MG TOTAL) BY MOUTH DAILY AS NEEDED FOR MUSCLE SPASMS. AS NEEDED FOR MUSCLE SPASMS   promethazine-dextromethorphan (PROMETHAZINE-DM) 6.25-15 MG/5ML syrup Take 5 mLs by mouth 4 (four) times daily as needed for cough.   No facility-administered medications prior to visit.    Review of Systems  Constitutional:  Negative for chills, fatigue and fever.  Respiratory:  Positive for cough. Negative for chest tightness, shortness of breath and wheezing.   Musculoskeletal:  Positive for back pain.  Neurological:  Negative for dizziness, light-headedness and headaches.        Objective    BP 128/70   Pulse 69   Temp (!) 97.1 F (36.2 C) (Temporal)   Resp 16   Ht 6\' 3"  (1.905 m)   Wt 236 lb (107 kg)   SpO2 97%   BMI 29.50 kg/m     Physical Exam  Vitals reviewed.  Constitutional:      General: He is awake.     Appearance: Normal appearance. He is well-developed and well-groomed.  HENT:     Head: Normocephalic and atraumatic.  Eyes:     General: Lids are normal. Gaze aligned appropriately.     Extraocular Movements: Extraocular movements intact.  Cardiovascular:     Rate and Rhythm: Normal rate and regular rhythm.     Pulses: Normal pulses.          Radial pulses are 2+ on the right side.     Heart sounds: Normal heart sounds. No murmur heard.    No friction rub. No gallop.  Pulmonary:     Effort: Pulmonary effort is normal.     Breath sounds: Normal breath sounds. No decreased air movement. No decreased breath sounds, wheezing, rhonchi or rales.  Musculoskeletal:     Cervical back: Normal range of motion.  Neurological:     Mental Status: He is alert.   Psychiatric:        Behavior: Behavior is cooperative.       No results found for any visits on 05/24/23.  Assessment & Plan      No follow-ups on file.       Problem List Items Addressed This Visit   None Visit Diagnoses     COVID-19    -  Primary Acute, ongoing Patient was seen via virtual visit twice and completed paxlovid course along with OTC medications for symptomatic relief He is still concerned about lingering productive cough that is typically worse in the AM  Provided reassurance that lungs were clear to auscultation, O2 sats are in normal range and temp is fine which is less likely with pneumonia Offered CXR for more definitive rule out- order placed and pt informed that he can get this done in the next 1-2 weeks if still having concerns.  Recommend starting daily antihistamine along with Flonase for drainage and cough. Suspect phlegm is likely secondary to nasal drainage vs pulmonary etiology Follow up as needed for persistent or progressing symptoms     Relevant Orders   DG Chest 2 View   Acute cough       Relevant Orders   DG Chest 2 View        No follow-ups on file.   I, Damyra Luscher E Harlie Buening, PA-C, have reviewed all documentation for this visit. The documentation on 05/24/23 for the exam, diagnosis, procedures, and orders are all accurate and complete.   Jacquelin Hawking, MHS, PA-C [facility]

## 2023-05-24 NOTE — Patient Instructions (Signed)
I also recommend adding an antihistamine to your daily regimen  This includes medications like Claritin, Allegra, Zyrtec- the generics of these work very well and are usually less expensive I recommend using Flonase nasal spray - 2 puffs twice per day to help with your nasal congestion The antihistamines and Flonase can take a few weeks to provide significant relief from allergy symptoms but should start to provide some benefit soon.  I have placed an order for a chest xray- this can be done at any time over the next 2 weeks at the address below if you still have breathing concerns or productive cough,  Please go here for your  xray   [address] [place] [address] [place], [place] [address]

## 2023-06-18 ENCOUNTER — Other Ambulatory Visit: Payer: Self-pay | Admitting: Family Medicine

## 2023-08-12 ENCOUNTER — Ambulatory Visit: Payer: PPO

## 2023-08-25 ENCOUNTER — Ambulatory Visit: Payer: PPO

## 2023-08-25 VITALS — BP 120/76 | Ht 75.0 in | Wt 232.2 lb

## 2023-08-25 DIAGNOSIS — Z0000 Encounter for general adult medical examination without abnormal findings: Secondary | ICD-10-CM | POA: Diagnosis not present

## 2023-08-25 NOTE — Patient Instructions (Addendum)
Curtis Underwood , Thank you for taking time to come for your Medicare Wellness Visit. I appreciate your ongoing commitment to your health goals. Please review the following plan we discussed and let me know if I can assist you in the future.   Referrals/Orders/Follow-Ups/Clinician Recommendations: NONE  This is a list of the screening recommended for you and due dates:  Health Maintenance  Topic Date Due   COVID-19 Vaccine (3 - Pfizer risk series) 11/27/2019   Flu Shot  10/31/2023*   Medicare Annual Wellness Visit  08/24/2024   Cologuard (Stool DNA test)  04/03/2026   DTaP/Tdap/Td vaccine (2 - Td or Tdap) 07/24/2028   Pneumonia Vaccine  Completed   Hepatitis C Screening  Completed   Zoster (Shingles) Vaccine  Completed   HPV Vaccine  Aged Out  *Topic was postponed. The date shown is not the original due date.    Advanced directives: (ACP Link)Information on Advanced Care Planning can be found at [facility] Advance Health Care Directives Advance Health Care Directives ([email or web address]   Next Medicare Annual Wellness Visit scheduled for next year: Yes   08/30/24 @ 8:10 AM IN PERSON

## 2023-08-25 NOTE — Progress Notes (Signed)
Subjective:   Curtis Underwood is a 68 y.o. male who presents for Medicare Annual/Subsequent preventive examination.  Visit Complete: In person  Cardiac Risk Factors include: advanced age (>31men, >44 women);dyslipidemia;hypertension;male gender     Objective:    Today's Vitals   08/25/23 0757  BP: 120/76  Weight: 232 lb 3.2 oz (105.3 kg)  Height: 6\' 3"  (1.905 m)   Body mass index is 29.02 kg/m.     08/25/2023    8:08 AM 09/27/2022    9:42 AM 08/10/2022    8:54 AM 08/28/2018    3:17 PM 12/16/2016    3:38 PM 11/16/2016   11:07 AM 05/26/2015    9:28 AM  Advanced Directives  Does Patient Have a Medical Advance Directive? No Yes No No No No No  Type of Furniture conservator/restorer;Living will       Copy of Healthcare Power of Attorney in Chart?  No - copy requested       Would patient like information on creating a medical advance directive? No - Patient declined   No - Patient declined   No - patient declined information    Current Medications (verified) Outpatient Encounter Medications as of 08/25/2023  Medication Sig   albuterol (VENTOLIN HFA) 108 (90 Base) MCG/ACT inhaler Inhale 2 puffs into the lungs every 6 (six) hours as needed for wheezing or shortness of breath.   aspirin 81 MG EC tablet TAKE 1 TABLET BY MOUTH EVERY DAY   atorvastatin (LIPITOR) 80 MG tablet Take 1 tablet (80 mg total) by mouth daily.   cetirizine (ZYRTEC) 10 MG tablet Take 10 mg by mouth as needed for allergies.   ezetimibe (ZETIA) 10 MG tablet Take 1 tablet (10 mg total) by mouth daily.   fluticasone (FLONASE) 50 MCG/ACT nasal spray Place 2 sprays into both nostrils daily.   lisinopril (ZESTRIL) 10 MG tablet TAKE 1 TABLET BY MOUTH EVERY DAY   montelukast (SINGULAIR) 10 MG tablet Take 1 tablet (10 mg total) by mouth at bedtime.   omeprazole (PRILOSEC) 20 MG capsule TAKE 1 CAPSULE BY MOUTH DAILY AS NEEDED   tiZANidine (ZANAFLEX) 4 MG tablet TAKE 1 TABLET (4 MG TOTAL) BY MOUTH DAILY AS  NEEDED FOR MUSCLE SPASMS. AS NEEDED FOR MUSCLE SPASMS   mometasone (NASONEX) 50 MCG/ACT nasal spray Place 2 sprays into the nose daily. (Patient not taking: Reported on 08/25/2023)   Olopatadine-Mometasone (RYALTRIS) 665-25 MCG/ACT SUSP Place 1 spray into the nose in the morning and at bedtime.   No facility-administered encounter medications on file as of 08/25/2023.    Allergies (verified) Patient has no known allergies.   History: Past Medical History:  Diagnosis Date   Dysplastic nevus 07/21/2022   left posterior shoulder with moderate atypia   Hyperlipidemia    Lesion of lip 05/03/2019   Sleep apnea    Past Surgical History:  Procedure Laterality Date   APPENDECTOMY     Family History  Adopted: Yes  Family history unknown: Yes   Social History   Socioeconomic History   Marital status: Married    Spouse name: Administrator, arts   Number of children: 2   Years of education: 14   Highest education level: Associate degree: occupational, Scientist, product/process development, or vocational program  Occupational History   Not on file  Tobacco Use   Smoking status: Some Days    Types: Cigars   Smokeless tobacco: Never  Vaping Use   Vaping status: Never Used  Substance and Sexual  Activity   Alcohol use: Yes    Alcohol/week: 4.0 standard drinks of alcohol    Types: 4 Cans of beer per week    Comment: 3-4 beers   Drug use: No   Sexual activity: Yes    Partners: Female  Other Topics Concern   Not on file  Social History Narrative   Not on file   Social Drivers of Health   Financial Resource Strain: Low Risk  (08/25/2023)   Overall Financial Resource Strain (CARDIA)    Difficulty of Paying Living Expenses: Not hard at all  Food Insecurity: No Food Insecurity (08/25/2023)   Hunger Vital Sign    Worried About Running Out of Food in the Last Year: Never true    Ran Out of Food in the Last Year: Never true  Transportation Needs: No Transportation Needs (08/25/2023)   PRAPARE - Scientist, research (physical sciences) (Medical): No    Lack of Transportation (Non-Medical): No  Physical Activity: Sufficiently Active (08/25/2023)   Exercise Vital Sign    Days of Exercise per Week: 4 days    Minutes of Exercise per Session: 90 min  Stress: No Stress Concern Present (08/25/2023)   [employer] of Occupational Health - Occupational Stress Questionnaire    Feeling of Stress : Not at all  Social Connections: Moderately Integrated (08/25/2023)   Social Connection and Isolation Panel [NHANES]    Frequency of Communication with Friends and Family: More than three times a week    Frequency of Social Gatherings with Friends and Family: Once a week    Attends Religious Services: 1 to 4 times per year    Active Member of [employer] or Organizations: No    Attends Engineer, structural: Never    Marital Status: Married    Tobacco Counseling Ready to quit: Not Answered Counseling given: Not Answered   Clinical Intake:  Pre-visit preparation completed: Yes  Pain : No/denies pain     BMI - recorded: 29.02 Nutritional Status: BMI 25 -29 Overweight Nutritional Risks: None Diabetes: No  How often do you need to have someone help you when you read instructions, pamphlets, or other written materials from your doctor or pharmacy?: 1 - Never  Interpreter Needed?: No  Information entered by :: Kennedy Bucker, LPN   Activities of Daily Living    08/25/2023    8:09 AM 05/24/2023    9:47 AM  In your present state of health, do you have any difficulty performing the following activities:  Hearing? 0 0  Vision? 0 0  Difficulty concentrating or making decisions? 0 0  Walking or climbing stairs? 0 0  Dressing or bathing? 0 0  Doing errands, shopping? 0 0  Preparing Food and eating ? N   Using the Toilet? N   In the past six months, have you accidently leaked urine? N   Do you have problems with loss of bowel control? N   Managing your Medications? N   Managing your Finances? N    Housekeeping or managing your Housekeeping? N     Patient Care Team: Danelle Berry, PA-C as PCP - General (Family Medicine) Deirdre Evener, MD (Dermatology)  Indicate any recent Medical Services you may have received from other than [facility] providers in the past year (date may be approximate).     Assessment:   This is a routine wellness examination for Horine.  Hearing/Vision screen Hearing Screening - Comments:: NO AIDS Vision Screening - Comments:: WE[facility] GLASSES ALL  [facility]   Goals Addressed             This Visit's Progress    DIET - EAT MORE FRUITS AND VEGETABLES         Depression Screen    08/25/2023    8:06 AM 05/24/2023    9:47 AM 05/12/2023   10:05 AM 03/29/2023   10:43 AM 09/27/2022    8:49 AM 08/10/2022    8:52 AM 01/22/2022    8:20 AM  PHQ 2/9 Scores  PHQ - 2 Score 0 0 0 0 0 0 0  PHQ- 9 Score 0 0 0 0 0 0 0    Fall Risk    08/25/2023    8:09 AM 05/24/2023    9:47 AM 05/12/2023   10:05 AM 03/29/2023   10:43 AM 09/27/2022    8:49 AM  Fall Risk   Falls in the past year? 0 0 0 0 0  Number falls in past yr: 0 0 0 0 0  Injury with Fall? 0 0 0 0 0  Risk for fall due to : No Fall Risks No Fall Risks No Fall Risks No Fall Risks No Fall Risks  Follow up Falls prevention discussed;Falls evaluation completed Falls prevention discussed;Education provided;Falls evaluation completed Falls prevention discussed;Education provided;Falls evaluation completed Falls prevention discussed;Education provided;Falls evaluation completed Falls prevention discussed;Education provided;Falls evaluation completed    MEDICARE RISK AT HOME: Medicare Risk at Home Any stairs in or around the home?: No If so, are there any without handrails?: No Home free of loose throw rugs in walkways, pet beds, electrical cords, etc?: Yes Adequate lighting in your home to reduce risk of falls?: Yes Life alert?: No Use of a cane, walker or w/c?: No Grab bars in the bathroom?:  No Shower chair or bench in shower?: No Elevated toilet seat or a handicapped toilet?: No  TIMED UP AND GO:  Was the test performed?  Yes  Length of time to ambulate 10 feet: 4 sec Gait steady and fast without use of assistive device    Cognitive Function:        08/25/2023    8:10 AM 08/10/2022    9:21 AM  6CIT Screen  What Year? 0 points 0 points  What month? 0 points 0 points  What time? 0 points 0 points  Count back from 20 0 points 0 points  Months in reverse 0 points 2 points  Repeat phrase 0 points 0 points  Total Score 0 points 2 points    Immunizations Immunization History  Administered Date(s) Administered   Influenza Inj Mdck Quad Pf 04/03/2022   Influenza,inj,Quad PF,6+ Mos 05/26/2015, 04/14/2018, 04/05/2019, 05/08/2020   PFIZER(Purple Top)SARS-COV-2 Vaccination 10/08/2019, 10/30/2019   PNEUMOCOCCAL CONJUGATE-20 01/22/2022   Pneumococcal Polysaccharide-23 05/27/2020   Tdap 07/24/2018   Zoster Recombinant(Shingrix) 04/08/2022, 06/08/2022    TDAP status: Up to date  Flu Vaccine status: Declined, Education has been provided regarding the importance of this vaccine but patient still declined. Advised may receive this vaccine at local pharmacy or Health Dept. Aware to provide a copy of the vaccination record if obtained from local pharmacy or Health Dept. Verbalized acceptance and understanding.  Pneumococcal vaccine status: Up to date  Covid-19 vaccine status: Completed vaccines  Qualifies for Shingles Vaccine? Yes   Zostavax completed No   Shingrix Completed?: Yes  Screening Tests Health Maintenance  Topic Date Due   COVID-19 Vaccine (3 - Pfizer risk series) 11/27/2019   INFLUENZA VACCINE  10/31/2023 (Originally 03/03/2023)   Medicare Annual Wellness (AWV)  08/24/2024   Fecal DNA (Cologuard)  04/03/2026   DTaP/Tdap/Td (2 - Td or Tdap) 07/24/2028   Pneumonia Vaccine 69+ Years old  Completed   Hepatitis C Screening  Completed   Zoster Vaccines- Shingrix   Completed   HPV VACCINES  Aged Out    Health Maintenance  Health Maintenance Due  Topic Date Due   COVID-19 Vaccine (3 - Pfizer risk series) 11/27/2019    Colorectal cancer screening: Type of screening: Cologuard. Completed 04/04/23. Repeat every 3 years  Lung Cancer Screening: (Low Dose CT Chest recommended if Age 82-80 years, 20 pack-year currently smoking OR have quit w/in 15years.) does not qualify.    Additional Screening:  Hepatitis C Screening: does qualify; Completed 07/24/18  Vision Screening: Recommended annual ophthalmology exams for early detection of glaucoma and other disorders of the eye. Is the patient up to date with their annual eye exam?  Yes  Who is the provider or what is the name of the office in which the patient attends annual eye exams? [facility] If pt is not established with a provider, would they like to be referred to a provider to establish care? No .   Dental Screening: Recommended annual dental exams for proper oral hygiene   Community Resource Referral / Chronic Care Management: CRR required this visit?  No   CCM required this visit?  No     Plan:     I have personally reviewed and noted the following in the patient's chart:   Medical and social history Use of alcohol, tobacco or illicit drugs  Current medications and supplements including opioid prescriptions. Patient is not currently taking opioid prescriptions. Functional ability and status Nutritional status Physical activity Advanced directives List of other physicians Hospitalizations, surgeries, and ER visits in previous 12 months Vitals Screenings to include cognitive, depression, and falls Referrals and appointments  In addition, I have reviewed and discussed with patient certain preventive protocols, quality metrics, and best practice recommendations. A written personalized care plan for preventive services as well as general preventive health recommendations were  provided to patient.     Hal Hope, LPN   [id number]   After Visit Summary: (In Person-Declined) Patient declined AVS at this time.  Nurse Notes: NONE

## 2023-09-17 ENCOUNTER — Other Ambulatory Visit: Payer: Self-pay | Admitting: Family Medicine

## 2023-09-17 DIAGNOSIS — J302 Other seasonal allergic rhinitis: Secondary | ICD-10-CM

## 2023-10-04 ENCOUNTER — Encounter: Payer: Self-pay | Admitting: Family Medicine

## 2023-10-13 ENCOUNTER — Ambulatory Visit: Payer: PPO | Admitting: Dermatology

## 2023-10-13 ENCOUNTER — Encounter: Payer: Self-pay | Admitting: Dermatology

## 2023-10-13 DIAGNOSIS — Z79899 Other long term (current) drug therapy: Secondary | ICD-10-CM

## 2023-10-13 DIAGNOSIS — L578 Other skin changes due to chronic exposure to nonionizing radiation: Secondary | ICD-10-CM

## 2023-10-13 DIAGNOSIS — Z86018 Personal history of other benign neoplasm: Secondary | ICD-10-CM

## 2023-10-13 DIAGNOSIS — D225 Melanocytic nevi of trunk: Secondary | ICD-10-CM | POA: Diagnosis not present

## 2023-10-13 DIAGNOSIS — L814 Other melanin hyperpigmentation: Secondary | ICD-10-CM | POA: Diagnosis not present

## 2023-10-13 DIAGNOSIS — B353 Tinea pedis: Secondary | ICD-10-CM

## 2023-10-13 DIAGNOSIS — L821 Other seborrheic keratosis: Secondary | ICD-10-CM | POA: Diagnosis not present

## 2023-10-13 DIAGNOSIS — L570 Actinic keratosis: Secondary | ICD-10-CM

## 2023-10-13 DIAGNOSIS — W908XXA Exposure to other nonionizing radiation, initial encounter: Secondary | ICD-10-CM | POA: Diagnosis not present

## 2023-10-13 DIAGNOSIS — D229 Melanocytic nevi, unspecified: Secondary | ICD-10-CM

## 2023-10-13 DIAGNOSIS — Z1283 Encounter for screening for malignant neoplasm of skin: Secondary | ICD-10-CM

## 2023-10-13 DIAGNOSIS — Z7189 Other specified counseling: Secondary | ICD-10-CM

## 2023-10-13 MED ORDER — KETOCONAZOLE 2 % EX CREA: TOPICAL_CREAM | CUTANEOUS | 6 refills | Status: AC

## 2023-10-13 NOTE — Progress Notes (Signed)
 Follow-Up Visit   Subjective  Curtis Underwood is a 68 y.o. male who presents for the following: Skin Cancer Screening and Full Body Skin Exam, hx of Dysplastic nevus.  The patient presents for Total-Body Skin Exam (TBSE) for skin cancer screening and mole check. The patient has spots, moles and lesions to be evaluated, some may be new or changing and the patient may have concern these could be cancer.  The following portions of the chart were reviewed this encounter and updated as appropriate: medications, allergies, medical history  Review of Systems:  No other skin or systemic complaints except as noted in HPI or Assessment and Plan.  Objective  Well appearing patient in no apparent distress; mood and affect are within normal limits.  A full examination was performed including scalp, head, eyes, ears, nose, lips, neck, chest, axillae, abdomen, back, buttocks, bilateral upper extremities, bilateral lower extremities, hands, feet, fingers, toes, fingernails, and toenails. All findings within normal limits unless otherwise noted below.   Relevant physical exam findings are noted in the Assessment and Plan.  left temple x 1 Erythematous thin papules/macules with gritty scale.   Assessment & Plan   SKIN CANCER SCREENING PERFORMED TODAY.  ACTINIC DAMAGE - Chronic condition, secondary to cumulative UV/sun exposure - diffuse scaly erythematous macules with underlying dyspigmentation - Recommend daily broad spectrum sunscreen SPF 30+ to sun-exposed areas, reapply every 2 hours as needed.  - Staying in the shade or wearing long sleeves, sun glasses (UVA+UVB protection) and wide brim hats (4-inch brim around the entire circumference of the hat) are also recommended for sun protection.  - Call for new or changing lesions.  LENTIGINES, SEBORRHEIC KERATOSES, HEMANGIOMAS - Benign normal skin lesions - Benign-appearing - Call for any changes  MELANOCYTIC NEVI Left inferior axillary/lateral  pectoral  0.6 x 0.3 flesh papule   - Benign appearing on exam today - Observation - Call clinic for new or changing moles - Recommend daily use of broad spectrum spf 30+ sunscreen to sun-exposed areas.   TINEA PEDIS feet Exam: Scaling and maceration web spaces and over distal and lateral soles. Chronic and persistent condition with duration or expected duration over one year. Condition is symptomatic / bothersome to patient. Not to goal. Treatment Plan: Start Ketoconazole cream apply to feet at bedtime   History of Dysplastic Nevi/mod atypia  Left posterior shoulder 12/202/23 - No evidence of recurrence today - Recommend regular full body skin exams - Recommend daily broad spectrum sunscreen SPF 30+ to sun-exposed areas, reapply every 2 hours as needed.  - Call if any new or changing lesions are noted between office visits   AK (ACTINIC KERATOSIS) left temple x 1 ACTINIC DAMAGE - chronic, secondary to cumulative UV radiation exposure/sun exposure over time - diffuse scaly erythematous macules with underlying dyspigmentation - Recommend daily broad spectrum sunscreen SPF 30+ to sun-exposed areas, reapply every 2 hours as needed.  - Recommend staying in the shade or wearing long sleeves, sun glasses (UVA+UVB protection) and wide brim hats (4-inch brim around the entire circumference of the hat). - Call for new or changing lesions.  Destruction of lesion - left temple x 1 Complexity: simple   Destruction method: cryotherapy   Informed consent: discussed and consent obtained   Timeout:  patient name, date of birth, surgical site, and procedure verified Lesion destroyed using liquid nitrogen: Yes   Region frozen until ice ball extended beyond lesion: Yes   Outcome: patient tolerated procedure well with no complications  Post-procedure details: wound care instructions given     Return in about 1 year (around 10/12/2024) for TBSE, hx of Dysplastic nevus.  IAngelique Holm, CMA,  am acting as scribe for Armida Sans, MD .   Documentation: I have reviewed the above documentation for accuracy and completeness, and I agree with the above.  Armida Sans, MD

## 2023-10-13 NOTE — Patient Instructions (Addendum)

## 2023-10-20 ENCOUNTER — Encounter: Admitting: Family Medicine

## 2023-11-06 ENCOUNTER — Other Ambulatory Visit: Payer: Self-pay | Admitting: Nurse Practitioner

## 2023-11-06 DIAGNOSIS — J302 Other seasonal allergic rhinitis: Secondary | ICD-10-CM

## 2023-11-08 ENCOUNTER — Encounter: Admitting: Family Medicine

## 2023-11-08 NOTE — Telephone Encounter (Signed)
 Requested Prescriptions  Pending Prescriptions Disp Refills   fluticasone (FLONASE) 50 MCG/ACT nasal spray [Pharmacy Med Name: FLUTICASONE PROP 50 MCG SPRAY] 48 mL 2    Sig: SPRAY 2 SPRAYS INTO EACH NOSTRIL EVERY DAY     Ear, Nose, and Throat: Nasal Preparations - Corticosteroids Failed - 11/08/2023  8:05 AM      Failed - Valid encounter within last 12 months    Recent Outpatient Visits   None     Future Appointments             In 1 week Danelle Berry, PA-C [facility]   In 1 year Deirdre Evener, MD [facility]

## 2023-11-15 ENCOUNTER — Ambulatory Visit (INDEPENDENT_AMBULATORY_CARE_PROVIDER_SITE_OTHER): Admitting: Family Medicine

## 2023-11-15 ENCOUNTER — Encounter: Payer: Self-pay | Admitting: Family Medicine

## 2023-11-15 VITALS — BP 128/68 | HR 80 | Resp 16 | Ht 75.0 in | Wt 232.0 lb

## 2023-11-15 DIAGNOSIS — Z125 Encounter for screening for malignant neoplasm of prostate: Secondary | ICD-10-CM

## 2023-11-15 DIAGNOSIS — Z136 Encounter for screening for cardiovascular disorders: Secondary | ICD-10-CM

## 2023-11-15 DIAGNOSIS — Z0000 Encounter for general adult medical examination without abnormal findings: Secondary | ICD-10-CM

## 2023-11-15 NOTE — Progress Notes (Signed)
 Patient: Curtis Underwood, Male    DOB: [birth year], 68 y.o.   MRN: [id number] Danelle Berry, PA-C Visit Date: 11/15/2023  Today's Provider: Danelle Berry, PA-C   Chief Complaint  Patient presents with   Annual Exam   Subjective:   Annual physical exam:  Curtis Underwood is a 68 y.o. male who presents today for health maintenance and annual & complete physical exam.   Exercise/Activity:  exercising often- walking  Diet/nutrition:  no changes to diet Sleep:  sleeping enough on CPAP  He is interested in checking Vit D level- prior labs near normal and he's started a supplement Last vitamin D Lab Results  Component Value Date   VD25OH 27 (L) 01/31/2020     SDOH Screenings   Food Insecurity: No Food Insecurity (08/25/2023)  Housing: Low Risk  (08/25/2023)  Transportation Needs: No Transportation Needs (08/25/2023)  Utilities: Not At Risk (08/25/2023)  Alcohol Screen: Low Risk  (08/25/2023)  Depression (PHQ2-9): Low Risk  (11/15/2023)  Financial Resource Strain: Low Risk  (08/25/2023)  Physical Activity: Sufficiently Active (08/25/2023)  Social Connections: Moderately Integrated (08/25/2023)  Stress: No Stress Concern Present (08/25/2023)  Tobacco Use: High Risk (11/15/2023)  Health Literacy: Adequate Health Literacy (08/25/2023)   USPSTF grade A and B recommendations - reviewed and addressed today  Depression:  Phq 9 completed today by patient, was reviewed by me with patient in the room, score is  negative, pt feels     11/15/2023   10:16 AM 08/25/2023    8:06 AM 05/24/2023    9:47 AM  Depression screen PHQ 2/9  Decreased Interest 0 0 0  Down, Depressed, Hopeless 0 0 0  PHQ - 2 Score 0 0 0  Altered sleeping  0 0  Tired, decreased energy  0 0  Change in appetite  0 0  Feeling bad or failure about yourself   0 0  Trouble concentrating  0 0  Moving slowly or fidgety/restless  0 0  Suicidal thoughts  0 0  PHQ-9 Score  0 0  Difficult doing work/chores  Not difficult at all Not  difficult at all    Hep C Screening: done  STD testing and prevention (HIV/chl/gon/syphilis):   none needed, no risk  Intimate partner violence: no concerns safe at home  Advanced Care Planning:  A voluntary discussion about advance care planning including the explanation and discussion of advance directives.  Discussed health care proxy and Living will, and the patient was able to identify a health care proxy as wife.  Patient does not have a living will at present time. If patient does have living will, I have requested they bring this to the clinic to be scanned in to their chart.  Health Maintenance  Topic Date Due   COVID-19 Vaccine (3 - Pfizer risk series) 11/27/2019   INFLUENZA VACCINE  03/02/2024   Medicare Annual Wellness (AWV)  08/24/2024   Fecal DNA (Cologuard)  04/03/2026   DTaP/Tdap/Td (2 - Td or Tdap) 07/24/2028   Pneumonia Vaccine 29+ Years old  Completed   Hepatitis C Screening  Completed   Zoster Vaccines- Shingrix  Completed   HPV VACCINES  Aged Out   Meningococcal B Vaccine  Aged Out    Skin cancer: per Derm - [facility] - sees anually    Colorectal cancer:  colonoscopy is UTD -    Pt denies change in sx, no red flags  Prostate cancer:  Prostate cancer screening with PSA: Discussed risks and  benefits of PSA testing and provided handout. Pt agrees  to have PSA drawn today. Lab Results  Component Value Date   PSA 0.99 09/27/2022   PSA 0.81 02/05/2021   PSA 1.2 01/31/2020    Urinary Symptoms:   IPSS     Row Name 11/15/23 1016         International Prostate Symptom Score   How often have you had the sensation of not emptying your bladder? Not at All     How often have you had to urinate less than every two hours? Not at All     How often have you found you stopped and started again several times when you urinated? Not at All     How often have you found it difficult to postpone urination? Not at All     How often have you had a weak  urinary stream? Not at All     How often have you had to strain to start urination? Not at All     How many times did you typically get up at night to urinate? None     Total IPSS Score 0              Lung cancer:   Low Dose CT Chest recommended if Age 68-80 years, 20 pack-year currently smoking OR have quit w/in 15years. Patient does not qualify.   Social History   Tobacco Use   Smoking status: Some Days    Types: Cigars   Smokeless tobacco: Never  Substance Use Topics   Alcohol use: Yes    Alcohol/week: 4.0 standard drinks of alcohol    Types: 4 Cans of beer per week    Comment: 3-4 beers     Alcohol screening: Flowsheet Row Clinical Support [facility]/25/2023 [facility]  AUDIT-C Score 2       AAA: recommended- The USPSTF recommends one-time screening with ultrasonography in men ages 46 to 38 years who have ever smoked  ECG: not indicated otday  Blood pressure/Hypertension: BP Readings from Last 3 Encounters:  11/15/23 128/68  08/25/23 120/76  05/24/23 128/70   Weight/Obesity: Wt Readings from Last 3 Encounters:  11/15/23 232 lb (105.2 kg)  08/25/23 232 lb 3.2 oz (105.3 kg)  05/24/23 236 lb (107 kg)   BMI Readings from Last 3 Encounters:  11/15/23 29.00 kg/m  08/25/23 29.02 kg/m  05/24/23 29.50 kg/m    Lipids:  Lab Results  Component Value Date   CHOL 134 09/27/2022   CHOL 169 01/22/2022   CHOL 156 02/05/2021   Lab Results  Component Value Date   HDL 38 (L) 09/27/2022   HDL 44 01/22/2022   HDL 54 02/05/2021   Lab Results  Component Value Date   LDLCALC 67 09/27/2022   LDLCALC 101 (H) 01/22/2022   LDLCALC 85 02/05/2021   Lab Results  Component Value Date   TRIG 249 (H) 09/27/2022   TRIG 138 01/22/2022   TRIG 80 02/05/2021   Lab Results  Component Value Date   CHOLHDL 3.5 09/27/2022   CHOLHDL 3.8 01/22/2022   CHOLHDL 2.9 02/05/2021   No results found for: "LDLDIRECT" Based on the results of lipid panel  his/her cardiovascular risk factor ( using Po[place] Cohort )  in the next 10 years is : The 10-year ASCVD risk score (Arnett DK, et al., 2019) is: 19%   Values used to calculate the score:     Age: 71 years     Sex:  Male     Is Non-Hispanic African American: No     Diabetic: No     Tobacco smoker: Yes     Systolic Blood Pressure: 128 mmHg     Is BP treated: Yes     HDL Cholesterol: 38 mg/dL     Total Cholesterol: 134 mg/dL Glucose:  Glucose, Bld  Date Value Ref Range Status  03/29/2023 105 (H) 65 - 99 mg/dL Final    Comment:    .            Fasting reference interval . For someone without known diabetes, a glucose value between 100 and 125 mg/dL is consistent with prediabetes and should be confirmed with a follow-up test. .   09/27/2022 95 65 - 99 mg/dL Final    Comment:    .            Fasting reference interval .   01/22/2022 98 65 - 99 mg/dL Final    Comment:    .            Fasting reference interval .     Social History       Social History   Socioeconomic History   Marital status: Married    Spouse name: Administrator, arts   Number of children: 2   Years of education: 14   Highest education level: Associate degree: occupational, Scientist, product/process development, or vocational program  Occupational History   Not on file  Tobacco Use   Smoking status: Some Days    Types: Cigars   Smokeless tobacco: Never  Vaping Use   Vaping status: Never Used  Substance and Sexual Activity   Alcohol use: Yes    Alcohol/week: 4.0 standard drinks of alcohol    Types: 4 Cans of beer per week    Comment: 3-4 beers   Drug use: No   Sexual activity: Yes    Partners: Female  Other Topics Concern   Not on file  Social History Narrative   Not on file   Social Drivers of Health   Financial Resource Strain: Low Risk  (08/25/2023)   Overall Financial Resource Strain (CARDIA)    Difficulty of Paying Living Expenses: Not hard at all  Food Insecurity: No Food Insecurity (08/25/2023)   Hunger Vital Sign     Worried About Running Out of Food in the Last Year: Never true    Ran Out of Food in the Last Year: Never true  Transportation Needs: No Transportation Needs (08/25/2023)   PRAPARE - Administrator, Civil Service (Medical): No    Lack of Transportation (Non-Medical): No  Physical Activity: Sufficiently Active (08/25/2023)   Exercise Vital Sign    Days of Exercise per Week: 4 days    Minutes of Exercise per Session: 90 min  Stress: No Stress Concern Present (08/25/2023)   [employer] of Occupational Health - Occupational Stress Questionnaire    Feeling of Stress : Not at all  Social Connections: Moderately Integrated (08/25/2023)   Social Connection and Isolation Panel [NHANES]    Frequency of Communication with Friends and Family: More than three times a week    Frequency of Social Gatherings with Friends and Family: Once a week    Attends Religious Services: 1 to 4 times per year    Active Member of [employer] or Organizations: No    Attends Banker Meetings: Never    Marital Status: Married    Family History  Family History  Adopted: Yes  Family history unknown: Yes    Patient Active Problem List   Diagnosis Date Noted   Primary hypertension 01/21/2022   Vitamin D insufficiency 01/31/2020   Overweight (BMI 25.0-29.9) 07/24/2018   History of transient ischemic attack (TIA) 04/14/2018   Gastroesophageal reflux disease without esophagitis 05/26/2015   Seasonal allergic rhinitis 05/26/2015   Dyslipidemia 05/26/2015   Smoker 05/26/2015   Obstructive sleep apnea 05/26/2015    Past Surgical History:  Procedure Laterality Date   APPENDECTOMY       Current Outpatient Medications:    albuterol (VENTOLIN HFA) 108 (90 Base) MCG/ACT inhaler, Inhale 2 puffs into the lungs every 6 (six) hours as needed for wheezing or shortness of breath., Disp: 8 g, Rfl: 0   aspirin 81 MG EC tablet, TAKE 1 TABLET BY MOUTH EVERY DAY, Disp: 90 tablet, Rfl: 1    atorvastatin (LIPITOR) 80 MG tablet, Take 1 tablet (80 mg total) by mouth daily., Disp: 90 tablet, Rfl: 3   cetirizine (ZYRTEC) 10 MG tablet, Take 10 mg by mouth as needed for allergies., Disp: , Rfl:    ezetimibe (ZETIA) 10 MG tablet, Take 1 tablet (10 mg total) by mouth daily., Disp: 90 tablet, Rfl: 3   fluticasone (FLONASE) 50 MCG/ACT nasal spray, SPRAY 2 SPRAYS INTO EACH NOSTRIL EVERY DAY, Disp: 48 mL, Rfl: 2   ketoconazole (NIZORAL) 2 % cream, Apply to feet at bedtime, Disp: 60 g, Rfl: 6   lisinopril (ZESTRIL) 10 MG tablet, TAKE 1 TABLET BY MOUTH EVERY DAY, Disp: 90 tablet, Rfl: 3   montelukast (SINGULAIR) 10 MG tablet, Take 1 tablet (10 mg total) by mouth at bedtime., Disp: 90 tablet, Rfl: 1   omeprazole (PRILOSEC) 20 MG capsule, TAKE 1 CAPSULE BY MOUTH DAILY AS NEEDED, Disp: 90 capsule, Rfl: 1   tiZANidine (ZANAFLEX) 4 MG tablet, TAKE 1 TABLET (4 MG TOTAL) BY MOUTH DAILY AS NEEDED FOR MUSCLE SPASMS. AS NEEDED FOR MUSCLE SPASMS, Disp: 90 tablet, Rfl: 0  No Known Allergies  Patient Care Team: Eriberto Felch, PA-C as PCP - General (Family Medicine) Elta Halter, MD (Dermatology)   Chart Review: I personally reviewed active problem list, medication list, allergies, family history, social history, health maintenance, notes from last encounter, lab results, imaging with the patient/caregiver today.   Review of Systems  Constitutional: Negative.   HENT: Negative.    Eyes: Negative.   Respiratory: Negative.    Cardiovascular: Negative.   Gastrointestinal: Negative.   Endocrine: Negative.   Genitourinary: Negative.   Musculoskeletal: Negative.   Skin: Negative.   Allergic/Immunologic: Negative.   Neurological: Negative.   Hematological: Negative.   Psychiatric/Behavioral: Negative.    All other systems reviewed and are negative.         Objective:   Vitals:  Vitals:   11/15/23 1017  BP: 128/68  Pulse: 80  Resp: 16  SpO2: 99%  Weight: 232 lb (105.2 kg)  Height: 6'  3" (1.905 m)    Body mass index is 29 kg/m.  Physical Exam Vitals and nursing note reviewed.  Constitutional:      General: He is not in acute distress.    Appearance: Normal appearance. He is well-developed. He is not ill-appearing, toxic-appearing or diaphoretic.  HENT:     Head: Normocephalic and atraumatic.     Right Ear: Tympanic membrane, ear canal and external ear normal. There is no impacted cerumen.     Left Ear: Tympanic membrane, ear canal and external ear  normal. There is no impacted cerumen.     Nose: Nose normal.     Mouth/Throat:     Mouth: Mucous membranes are moist.     Pharynx: Oropharynx is clear. No oropharyngeal exudate or posterior oropharyngeal erythema.  Eyes:     General: No scleral icterus.       Right eye: No discharge.        Left eye: No discharge.     Conjunctiva/sclera: Conjunctivae normal.  Neck:     Trachea: No tracheal deviation.  Cardiovascular:     Rate and Rhythm: Normal rate and regular rhythm.     Pulses: Normal pulses.     Heart sounds: Normal heart sounds. No murmur heard.    No friction rub. No gallop.  Pulmonary:     Effort: Pulmonary effort is normal. No respiratory distress.     Breath sounds: Normal breath sounds. No stridor. No wheezing, rhonchi or rales.  Abdominal:     General: Abdomen is protuberant. Bowel sounds are normal. There is no abdominal bruit.  Musculoskeletal:     Right lower leg: No edema.     Left lower leg: No edema.  Skin:    General: Skin is warm and dry.     Findings: No rash.  Neurological:     Mental Status: He is alert.     Motor: No abnormal muscle tone.     Coordination: Coordination normal.  Psychiatric:        Behavior: Behavior normal.      No results found for this or any previous visit (from the past 2160 hours).  Fall Risk:    11/15/2023   10:16 AM 08/25/2023    8:09 AM 05/24/2023    9:47 AM 05/12/2023   10:05 AM 03/29/2023   10:43 AM  Fall Risk   Falls in the past year? 0 0 0 0 0   Number falls in past yr: 0 0 0 0 0  Injury with Fall? 0 0 0 0 0  Risk for fall due to : No Fall Risks No Fall Risks No Fall Risks No Fall Risks No Fall Risks  Follow up Falls prevention discussed Falls prevention discussed;Falls evaluation completed Falls prevention discussed;Education provided;Falls evaluation completed Falls prevention discussed;Education provided;Falls evaluation completed Falls prevention discussed;Education provided;Falls evaluation completed    Functional Status Survey: Is the patient deaf or have difficulty hearing?: No Does the patient have difficulty seeing, even when wearing glasses/contacts?: No Does the patient have difficulty concentrating, remembering, or making decisions?: No Does the patient have difficulty walking or climbing stairs?: No Does the patient have difficulty dressing or bathing?: No Does the patient have difficulty doing errands alone such as visiting a doctor's office or shopping?: No   Assessment & Plan:    CPE completed today  Prostate cancer screening and PSA options (with potential risks and benefits of testing vs not testing) were discussed along with recent recs/guidelines, shared decision making and handout/information given to pt today  USPSTF grade A and B recommendations reviewed with patient; age-appropriate recommendations, preventive care, screening tests, etc discussed and encouraged; healthy living encouraged; see AVS for patient education given to patient  Discussed importance of 150 minutes of physical activity weekly, AHA exercise recommendations given to pt in AVS/handout  Discussed importance of healthy diet:  eating lean meats and proteins, avoiding trans fats and saturated fats, avoid simple sugars and excessive carbs in diet, eat 6 servings of fruit/vegetables daily and drink plenty of water and avoid  sweet beverages.  DASH diet reviewed if pt has HTN  Recommended pt to do annual eye exam and routine dental  exams/cleanings  Advance Care planning information and packet discussed and offered today, encouraged pt to discuss with family members/spouse/partner/friends and complete Advanced directive packet and bring copy to office   Reviewed Health Maintenance: Health Maintenance  Topic Date Due   COVID-19 Vaccine (3 - Pfizer risk series) 11/27/2019   INFLUENZA VACCINE  03/02/2024   Medicare Annual Wellness (AWV)  08/24/2024   Fecal DNA (Cologuard)  04/03/2026   DTaP/Tdap/Td (2 - Td or Tdap) 07/24/2028   Pneumonia Vaccine 50+ Years old  Completed   Hepatitis C Screening  Completed   Zoster Vaccines- Shingrix  Completed   HPV VACCINES  Aged Out   Meningococcal B Vaccine  Aged Out    Immunizations: Immunization History  Administered Date(s) Administered   Influenza Inj Mdck Quad Pf 04/03/2022   Influenza, High Dose Seasonal PF 09/13/2023   Influenza,inj,Quad PF,6+ Mos 05/26/2015, 04/14/2018, 04/05/2019, 05/08/2020   PFIZER(Purple Top)SARS-COV-2 Vaccination 10/08/2019, 10/30/2019   PNEUMOCOCCAL CONJUGATE-20 01/22/2022   Pneumococcal Polysaccharide-23 05/27/2020   Tdap 07/24/2018   Zoster Recombinant(Shingrix) 04/08/2022, 06/08/2022   Vaccines:  Reviewed and UTD      ICD-10-CM   1. Adult general medical exam  Z00.00 US  AORTA MEDICARE SCREENING    CBC with Differential/Platelet    Comprehensive metabolic panel with GFR    Hemoglobin A1c    Lipid panel    PSA    2. Encounter for abdominal aortic aneurysm (AAA) screening  Z13.6 US  AORTA MEDICARE SCREENING    3. Screening for malignant neoplasm of prostate  Z12.5 PSA     6 month routine f/up appt recommended     Adeline Hone, PA-C 11/15/23 10:30 AM  [facility]

## 2023-11-15 NOTE — Patient Instructions (Signed)
 Health Maintenance  Topic Date Due   COVID-19 Vaccine (3 - Pfizer risk series) 11/27/2019   Flu Shot  03/02/2024   Medicare Annual Wellness Visit  08/24/2024   Cologuard (Stool DNA test)  04/03/2026   DTaP/Tdap/Td vaccine (2 - Td or Tdap) 07/24/2028   Pneumonia Vaccine  Completed   Hepatitis C Screening  Completed   Zoster (Shingles) Vaccine  Completed   HPV Vaccine  Aged Out   Meningitis B Vaccine  Aged Out    See this link for instructions and documents that will help you complete advance care planning/advanced directives - including designating a medical power of attorney, completing a living will, etc  [email or web address]

## 2023-11-24 ENCOUNTER — Ambulatory Visit
Admission: RE | Admit: 2023-11-24 | Discharge: 2023-11-24 | Disposition: A | Discharge status: Home / Self Care | Source: Ambulatory Visit | Attending: Family Medicine | Admitting: Family Medicine

## 2023-11-24 ENCOUNTER — Encounter: Payer: Self-pay | Admitting: Family Medicine

## 2023-11-24 DIAGNOSIS — Z0000 Encounter for general adult medical examination without abnormal findings: Secondary | ICD-10-CM | POA: Diagnosis present

## 2023-11-24 DIAGNOSIS — Z136 Encounter for screening for cardiovascular disorders: Secondary | ICD-10-CM | POA: Diagnosis not present

## 2023-11-24 DIAGNOSIS — Z87891 Personal history of nicotine dependence: Secondary | ICD-10-CM | POA: Diagnosis not present

## 2023-11-25 ENCOUNTER — Ambulatory Visit

## 2024-01-19 ENCOUNTER — Other Ambulatory Visit: Payer: Self-pay | Admitting: Family Medicine

## 2024-01-19 DIAGNOSIS — I10 Essential (primary) hypertension: Secondary | ICD-10-CM

## 2024-01-20 NOTE — Telephone Encounter (Signed)
 Requested Prescriptions  Pending Prescriptions Disp Refills   lisinopril  (ZESTRIL ) 10 MG tablet [Pharmacy Med Name: LISINOPRIL  10 MG TABLET] 90 tablet 0    Sig: TAKE 1 TABLET BY MOUTH EVERY DAY     Cardiovascular:  ACE Inhibitors Failed - 01/20/2024  3:27 PM      Failed - Cr in normal range and within 180 days    Creat  Date Value Ref Range Status  03/29/2023 0.95 0.70 - 1.35 mg/dL Final         Failed - K in normal range and within 180 days    Potassium  Date Value Ref Range Status  03/29/2023 4.7 3.5 - 5.3 mmol/L Final         Passed - Patient is not pregnant      Passed - Last BP in normal range    BP Readings from Last 1 Encounters:  11/15/23 128/68         Passed - Valid encounter within last 6 months    Recent Outpatient Visits           2 months ago Adult general medical exam   [facility] Adeline Hone, PA-C       Future Appointments             In 11 months Elta Halter, MD [facility]

## 2024-02-24 ENCOUNTER — Telehealth: Admitting: Physician Assistant

## 2024-02-24 DIAGNOSIS — J019 Acute sinusitis, unspecified: Secondary | ICD-10-CM | POA: Diagnosis not present

## 2024-02-24 DIAGNOSIS — B9689 Other specified bacterial agents as the cause of diseases classified elsewhere: Secondary | ICD-10-CM | POA: Diagnosis not present

## 2024-02-24 MED ORDER — AMOXICILLIN-POT CLAVULANATE 875-125 MG PO TABS: 1.0000 | ORAL_TABLET | Freq: Two times a day (BID) | ORAL | 0 refills | Status: DC

## 2024-02-24 NOTE — Patient Instructions (Signed)
 Curtis Underwood, thank you for joining Delon CHRISTELLA Dickinson, PA-C for today's virtual visit.  While this provider is not your primary care provider (PCP), if your PCP is located in our provider database this encounter information will be shared with them immediately following your visit.   [name] MyChart account gives you access to today's visit and all your visits, tests, and labs performed at [facility]  click here if you don't have a [name] MyChart account or go to [email or web address]  Consent: (Patient) Curtis Underwood provided verbal consent for this virtual visit at the beginning of the encounter.  Current Medications:  Current Outpatient Medications:    amoxicillin -clavulanate (AUGMENTIN ) 875-125 MG tablet, Take 1 tablet by mouth 2 (two) times daily., Disp: 14 tablet, Rfl: 0   albuterol  (VENTOLIN  HFA) 108 (90 Base) MCG/ACT inhaler, Inhale 2 puffs into the lungs every 6 (six) hours as needed for wheezing or shortness of breath., Disp: 8 g, Rfl: 0   aspirin  81 MG EC tablet, TAKE 1 TABLET BY MOUTH EVERY DAY, Disp: 90 tablet, Rfl: 1   atorvastatin  (LIPITOR) 80 MG tablet, Take 1 tablet (80 mg total) by mouth daily., Disp: 90 tablet, Rfl: 3   cetirizine  (ZYRTEC ) 10 MG tablet, Take 10 mg by mouth as needed for allergies., Disp: , Rfl:    ezetimibe  (ZETIA ) 10 MG tablet, Take 1 tablet (10 mg total) by mouth daily., Disp: 90 tablet, Rfl: 3   fluticasone  (FLONASE ) 50 MCG/ACT nasal spray, SPRAY 2 SPRAYS INTO EACH NOSTRIL EVERY DAY, Disp: 48 mL, Rfl: 2   ketoconazole  (NIZORAL ) 2 % cream, Apply to feet at bedtime, Disp: 60 g, Rfl: 6   lisinopril  (ZESTRIL ) 10 MG tablet, TAKE 1 TABLET BY MOUTH EVERY DAY, Disp: 90 tablet, Rfl: 0   montelukast  (SINGULAIR ) 10 MG tablet, Take 1 tablet (10 mg total) by mouth at bedtime., Disp: 90 tablet, Rfl: 1   omeprazole  (PRILOSEC) 20 MG capsule, TAKE 1 CAPSULE BY MOUTH DAILY AS NEEDED, Disp: 90 capsule, Rfl: 1   tiZANidine  (ZANAFLEX ) 4 MG  tablet, TAKE 1 TABLET (4 MG TOTAL) BY MOUTH DAILY AS NEEDED FOR MUSCLE SPASMS. AS NEEDED FOR MUSCLE SPASMS, Disp: 90 tablet, Rfl: 0   Medications ordered in this encounter:  Meds ordered this encounter  Medications   amoxicillin -clavulanate (AUGMENTIN ) 875-125 MG tablet    Sig: Take 1 tablet by mouth 2 (two) times daily.    Dispense:  14 tablet    Refill:  0    Supervising Provider:   BLAISE ALEENE KIDD [[id number]]     *If you need refills on other medications prior to your next appointment, please contact your pharmacy*  Follow-Up: Call back or seek an in-person evaluation if the symptoms worsen or if the condition fails to improve as anticipated.  [facility] [phone]  Other Instructions Sinus Infection, Adult A sinus infection, also called sinusitis, is inflammation of your sinuses. Sinuses are hollow spaces in the bones around your face. Your sinuses are located: Around your eyes. In the middle of your forehead. Behind your nose. In your cheekbones. Mucus normally drains out of your sinuses. When your nasal tissues become inflamed or swollen, mucus can become trapped or blocked. This allows bacteria, viruses, and fungi to grow, which leads to infection. Most infections of the sinuses are caused by a virus. A sinus infection can develop quickly. It can last for up to 4 weeks (acute) or for more than 12 weeks (chronic). A sinus  infection often develops after a cold. What are the causes? This condition is caused by anything that creates swelling in the sinuses or stops mucus from draining. This includes: Allergies. Asthma. Infection from bacteria or viruses. Deformities or blockages in your nose or sinuses. Abnormal growths in the nose (nasal polyps). Pollutants, such as chemicals or irritants in the air. Infection from fungi. This is rare. What increases the risk? You are more likely to develop this condition if you: Have a weak body defense system (immune  system). Do a lot of swimming or diving. Overuse nasal sprays. Smoke. What are the signs or symptoms? The main symptoms of this condition are pain and a feeling of pressure around the affected sinuses. Other symptoms include: Stuffy nose or congestion that makes it difficult to breathe through your nose. Thick yellow or greenish drainage from your nose. Tenderness, swelling, and warmth over the affected sinuses. A cough that may get worse at night. Decreased sense of smell and taste. Extra mucus that collects in the throat or the back of the nose (postnasal drip) causing a sore throat or bad breath. Tiredness (fatigue). Fever. How is this diagnosed? This condition is diagnosed based on: Your symptoms. Your medical history. A physical exam. Tests to find out if your condition is acute or chronic. This may include: Checking your nose for nasal polyps. Viewing your sinuses using a device that has a light (endoscope). Testing for allergies or bacteria. Imaging tests, such as an MRI or CT scan. In rare cases, a bone biopsy may be done to rule out more serious types of fungal sinus disease. How is this treated? Treatment for a sinus infection depends on the cause and whether your condition is chronic or acute. If caused by a virus, your symptoms should go away on their own within 10 days. You may be given medicines to relieve symptoms. They include: Medicines that shrink swollen nasal passages (decongestants). A spray that eases inflammation of the nostrils (topical intranasal corticosteroids). Rinses that help get rid of thick mucus in your nose (nasal saline washes). Medicines that treat allergies (antihistamines). Over-the-counter pain relievers. If caused by bacteria, your health care provider may recommend waiting to see if your symptoms improve. Most bacterial infections will get better without antibiotic medicine. You may be given antibiotics if you have: A severe infection. A  weak immune system. If caused by narrow nasal passages or nasal polyps, surgery may be needed. Follow these instructions at home: Medicines Take, use, or apply over-the-counter and prescription medicines only as told by your health care provider. These may include nasal sprays. If you were prescribed an antibiotic medicine, take it as told by your health care provider. Do not stop taking the antibiotic even if you start to feel better. Hydrate and humidify  Drink enough fluid to keep your urine pale yellow. Staying hydrated will help to thin your mucus. Use a cool mist humidifier to keep the humidity level in your home above 50%. Inhale steam for 10-15 minutes, 3-4 times a day, or as told by your health care provider. You can do this in the bathroom while a hot shower is running. Limit your exposure to cool or dry air. Rest Rest as much as possible. Sleep with your head raised (elevated). Make sure you get enough sleep each night. General instructions  Apply a warm, moist washcloth to your face 3-4 times a day or as told by your health care provider. This will help with discomfort. Use nasal saline washes  as often as told by your health care provider. Wash your hands often with soap and water to reduce your exposure to germs. If soap and water are not available, use hand sanitizer. Do not smoke. Avoid being around people who are smoking (secondhand smoke). Keep all follow-up visits. This is important. Contact a health care provider if: You have a fever. Your symptoms get worse. Your symptoms do not improve within 10 days. Get help right away if: You have a severe headache. You have persistent vomiting. You have severe pain or swelling around your face or eyes. You have vision problems. You develop confusion. Your neck is stiff. You have trouble breathing. These symptoms may be an emergency. Get help right away. Call 911. Do not wait to see if the symptoms will go away. Do not  drive yourself to the hospital. Summary A sinus infection is soreness and inflammation of your sinuses. Sinuses are hollow spaces in the bones around your face. This condition is caused by nasal tissues that become inflamed or swollen. The swelling traps or blocks the flow of mucus. This allows bacteria, viruses, and fungi to grow, which leads to infection. If you were prescribed an antibiotic medicine, take it as told by your health care provider. Do not stop taking the antibiotic even if you start to feel better. Keep all follow-up visits. This is important. This information is not intended to replace advice given to you by your health care provider. Make sure you discuss any questions you have with your health care provider. Document Revised: 06/23/2021 Document Reviewed: 06/23/2021 Elsevier Patient Education  2024 Elsevier Inc[employer]   If you have been instructed to have an in-person evaluation today at a local Urgent Care facility, please use the link below. It will take you to a list of all of our available [facility], including address, phone number and hours of operation. Please do not delay care.  [facility]  If you or a family member do not have a primary care provider, use the link below to schedule a visit and establish care. When you choose a [facility] primary care physician or advanced practice provider, you gain a long-term partner in health. Find a Primary Care Provider  Learn more about [facility]'s in-office and virtual care options:  - Get Care Now

## 2024-02-24 NOTE — Progress Notes (Signed)
 Virtual Visit Consent   [name], you are scheduled for a virtual visit with a [facility] provider today. Just as with appointments in the office, your consent must be obtained to participate. Your consent will be active for this visit and any virtual visit you may have with one of our providers in the next 365 days. If you have a MyChart account, a copy of this consent can be sent to you electronically.  As this is a virtual visit, video technology does not allow for your provider to perform a traditional examination. This may limit your provider's ability to fully assess your condition. If your provider identifies any concerns that need to be evaluated in person or the need to arrange testing (such as labs, EKG, etc.), we will make arrangements to do so. Although advances in technology are sophisticated, we cannot ensure that it will always work on either your end or our end. If the connection with a video visit is poor, the visit may have to be switched to a telephone visit. With either a video or telephone visit, we are not always able to ensure that we have a secure connection.  By engaging in this virtual visit, you consent to the provision of healthcare and authorize for your insurance to be billed (if applicable) for the services provided during this visit. Depending on your insurance coverage, you may receive a charge related to this service.  I need to obtain your verbal consent now. Are you willing to proceed with your visit today? Curtis Underwood has provided verbal consent on 02/24/2024 for a virtual visit (video or telephone). Curtis Underwood Dickinson, PA-C  Date: 02/24/2024 4:45 PM   Virtual Visit via Video Note   IDelon Underwood Dickinson, connected with  ADIL TUGWELL  ([id number], 20-Jun-1956) on 02/24/24 at  4:45 PM EDT by a video-enabled telemedicine application and verified that I am speaking with the correct person using two identifiers.  Location: Patient: Virtual Visit  Location Patient: Home Provider: Virtual Visit Location Provider: Home Office   I discussed the limitations of evaluation and management by telemedicine and the availability of in person appointments. The patient expressed understanding and agreed to proceed.    History of Present Illness: Curtis Underwood is a 68 y.o. who identifies as a male who was assigned male at birth, and is being seen today for sinus congestion.  HPI: Sinusitis This is a new problem. The current episode started in the past 7 days. The problem has been gradually worsening since onset. Maximum temperature: subjective fever, feels clammy. The fever has been present for 1 to 2 days. The pain is moderate. Associated symptoms include congestion, coughing, ear pain (right, mild ache), headaches, sinus pressure and a sore throat (from drainage). Pertinent negatives include no chills, diaphoresis, hoarse voice, shortness of breath or swollen glands. (Wheezing) Treatments tried: alka seltzer day and night. The treatment provided no relief.  At home Covid testing was negative x 2    Problems:  Patient Active Problem List   Diagnosis Date Noted   Primary hypertension 01/21/2022   Vitamin D  insufficiency 01/31/2020   Overweight (BMI 25.0-29.9) 07/24/2018   History of transient ischemic attack (TIA) 04/14/2018   Gastroesophageal reflux disease without esophagitis 05/26/2015   Seasonal allergic rhinitis 05/26/2015   Dyslipidemia 05/26/2015   Smoker 05/26/2015   Obstructive sleep apnea 05/26/2015    Allergies: No Known Allergies Medications:  Current Outpatient Medications:    amoxicillin -clavulanate (AUGMENTIN ) 875-125 MG tablet, Take  1 tablet by mouth 2 (two) times daily., Disp: 14 tablet, Rfl: 0   albuterol  (VENTOLIN  HFA) 108 (90 Base) MCG/ACT inhaler, Inhale 2 puffs into the lungs every 6 (six) hours as needed for wheezing or shortness of breath., Disp: 8 g, Rfl: 0   aspirin  81 MG EC tablet, TAKE 1 TABLET BY MOUTH EVERY  DAY, Disp: 90 tablet, Rfl: 1   atorvastatin  (LIPITOR) 80 MG tablet, Take 1 tablet (80 mg total) by mouth daily., Disp: 90 tablet, Rfl: 3   cetirizine  (ZYRTEC ) 10 MG tablet, Take 10 mg by mouth as needed for allergies., Disp: , Rfl:    ezetimibe  (ZETIA ) 10 MG tablet, Take 1 tablet (10 mg total) by mouth daily., Disp: 90 tablet, Rfl: 3   fluticasone  (FLONASE ) 50 MCG/ACT nasal spray, SPRAY 2 SPRAYS INTO EACH NOSTRIL EVERY DAY, Disp: 48 mL, Rfl: 2   ketoconazole  (NIZORAL ) 2 % cream, Apply to feet at bedtime, Disp: 60 g, Rfl: 6   lisinopril  (ZESTRIL ) 10 MG tablet, TAKE 1 TABLET BY MOUTH EVERY DAY, Disp: 90 tablet, Rfl: 0   montelukast  (SINGULAIR ) 10 MG tablet, Take 1 tablet (10 mg total) by mouth at bedtime., Disp: 90 tablet, Rfl: 1   omeprazole  (PRILOSEC) 20 MG capsule, TAKE 1 CAPSULE BY MOUTH DAILY AS NEEDED, Disp: 90 capsule, Rfl: 1   tiZANidine  (ZANAFLEX ) 4 MG tablet, TAKE 1 TABLET (4 MG TOTAL) BY MOUTH DAILY AS NEEDED FOR MUSCLE SPASMS. AS NEEDED FOR MUSCLE SPASMS, Disp: 90 tablet, Rfl: 0  Observations/Objective: Patient is well-developed, well-nourished in no acute distress.  Resting comfortably at home.  Head is normocephalic, atraumatic.  No labored breathing.  Speech is clear and coherent with logical content.  Patient is alert and oriented at baseline.    Assessment and Plan: 1. Acute bacterial sinusitis (Primary) - amoxicillin -clavulanate (AUGMENTIN ) 875-125 MG tablet; Take 1 tablet by mouth 2 (two) times daily.  Dispense: 14 tablet; Refill: 0  - Worsening symptoms that have not responded to OTC medications.  - Will give Augmentin  - Continue allergy medications.  - Steam and humidifier can help - Stay well hydrated and get plenty of rest.  - Seek in person evaluation if no symptom improvement or if symptoms worsen   Follow Up Instructions: I discussed the assessment and treatment plan with the patient. The patient was provided an opportunity to ask questions and all were  answered. The patient agreed with the plan and demonstrated an understanding of the instructions.  A copy of instructions were sent to the patient via MyChart unless otherwise noted below.    The patient was advised to call back or seek an in-person evaluation if the symptoms worsen or if the condition fails to improve as anticipated.    Curtis Underwood Dickinson, PA-C

## 2024-03-11 ENCOUNTER — Telehealth: Admitting: Family

## 2024-03-11 DIAGNOSIS — J0111 Acute recurrent frontal sinusitis: Secondary | ICD-10-CM | POA: Diagnosis not present

## 2024-03-11 MED ORDER — DOXYCYCLINE HYCLATE 100 MG PO TABS: 100.0000 mg | ORAL_TABLET | Freq: Two times a day (BID) | ORAL | 0 refills | Status: DC

## 2024-03-11 MED ORDER — CETIRIZINE HCL 10 MG PO TABS
10.0000 mg | ORAL_TABLET | ORAL | 1 refills | Status: AC | PRN
Start: 2024-03-11 — End: ?

## 2024-03-11 MED ORDER — FLUTICASONE PROPIONATE 50 MCG/ACT NA SUSP: 2.0000 | Freq: Every day | NASAL | 6 refills | Status: AC

## 2024-03-11 NOTE — Progress Notes (Signed)
 Virtual Visit Consent   Curtis Underwood, you are scheduled for a virtual visit with a [facility] provider today. Just as with appointments in the office, your consent must be obtained to participate. Your consent will be active for this visit and any virtual visit you may have with one of our providers in the next 365 days. If you have a MyChart account, a copy of this consent can be sent to you electronically.  As this is a virtual visit, video technology does not allow for your provider to perform a traditional examination. This may limit your provider's ability to fully assess your condition. If your provider identifies any concerns that need to be evaluated in person or the need to arrange testing (such as labs, EKG, etc.), we will make arrangements to do so. Although advances in technology are sophisticated, we cannot ensure that it will always work on either your end or our end. If the connection with a video visit is poor, the visit may have to be switched to a telephone visit. With either a video or telephone visit, we are not always able to ensure that we have a secure connection.  By engaging in this virtual visit, you consent to the provision of healthcare and authorize for your insurance to be billed (if applicable) for the services provided during this visit. Depending on your insurance coverage, you may receive a charge related to this service.  I need to obtain your verbal consent now. Are you willing to proceed with your visit today? Curtis Underwood has provided verbal consent on 03/11/2024 for a virtual visit (video or telephone). Bari Learn, FNP  Date: 03/11/2024 12:04 PM   Virtual Visit via Video Note   I, Bari Learn, connected with  Curtis Underwood  ([id number], 09/02/1947) on 03/11/24 at 12:00 PM EDT by a video-enabled telemedicine application and verified that I am speaking with the correct person using two identifiers.  Location: Patient: Virtual Visit Location Patient:  Home Provider: Virtual Visit Location Provider: Home Office   I discussed the limitations of evaluation and management by telemedicine and the availability of in person appointments. The patient expressed understanding and agreed to proceed.    History of Present Illness: Curtis Underwood is a 68 y.o. who identifies as a male who was assigned male at birth, and is being seen today for recurrent sinus congestion. He was seen on 02/24/24 and given Augmentin . States he continues to have sinus pain or congestion.   HPI: Sinusitis This is a new problem. The current episode started 1 to 4 weeks ago. The problem has been waxing and waning since onset. His pain is at a severity of 4/10. The pain is mild. Associated symptoms include headaches, sinus pressure and sneezing. Pertinent negatives include no coughing, ear pain, shortness of breath or sore throat. Past treatments include oral decongestants and acetaminophen. The treatment provided mild relief.    Problems:  Patient Active Problem List   Diagnosis Date Noted   Primary hypertension 01/21/2022   Vitamin D  insufficiency 01/31/2020   Overweight (BMI 25.0-29.9) 07/24/2018   History of transient ischemic attack (TIA) 04/14/2018   Gastroesophageal reflux disease without esophagitis 05/26/2015   Seasonal allergic rhinitis 05/26/2015   Dyslipidemia 05/26/2015   Smoker 05/26/2015   Obstructive sleep apnea 05/26/2015    Allergies: No Known Allergies Medications:  Current Outpatient Medications:    doxycycline  (VIBRA -TABS) 100 MG tablet, Take 1 tablet (100 mg total) by mouth 2 (two) times daily., Disp: 20  tablet, Rfl: 0   fluticasone  (FLONASE ) 50 MCG/ACT nasal spray, Place 2 sprays into both nostrils daily., Disp: 16 g, Rfl: 6   albuterol  (VENTOLIN  HFA) 108 (90 Base) MCG/ACT inhaler, Inhale 2 puffs into the lungs every 6 (six) hours as needed for wheezing or shortness of breath., Disp: 8 g, Rfl: 0   aspirin  81 MG EC tablet, TAKE 1 TABLET BY MOUTH  EVERY DAY, Disp: 90 tablet, Rfl: 1   atorvastatin  (LIPITOR) 80 MG tablet, Take 1 tablet (80 mg total) by mouth daily., Disp: 90 tablet, Rfl: 3   cetirizine  (ZYRTEC ) 10 MG tablet, Take 1 tablet (10 mg total) by mouth as needed for allergies., Disp: 90 tablet, Rfl: 1   ezetimibe  (ZETIA ) 10 MG tablet, Take 1 tablet (10 mg total) by mouth daily., Disp: 90 tablet, Rfl: 3   ketoconazole  (NIZORAL ) 2 % cream, Apply to feet at bedtime, Disp: 60 g, Rfl: 6   lisinopril  (ZESTRIL ) 10 MG tablet, TAKE 1 TABLET BY MOUTH EVERY DAY, Disp: 90 tablet, Rfl: 0   montelukast  (SINGULAIR ) 10 MG tablet, Take 1 tablet (10 mg total) by mouth at bedtime., Disp: 90 tablet, Rfl: 1   omeprazole  (PRILOSEC) 20 MG capsule, TAKE 1 CAPSULE BY MOUTH DAILY AS NEEDED, Disp: 90 capsule, Rfl: 1   tiZANidine  (ZANAFLEX ) 4 MG tablet, TAKE 1 TABLET (4 MG TOTAL) BY MOUTH DAILY AS NEEDED FOR MUSCLE SPASMS. AS NEEDED FOR MUSCLE SPASMS, Disp: 90 tablet, Rfl: 0  Observations/Objective: Patient is well-developed, well-nourished in no acute distress.  Resting comfortably  at home.  Head is normocephalic, atraumatic.  No labored breathing.  Speech is clear and coherent with logical content.  Patient is alert and oriented at baseline.  Nasal congestion  Sinus pressure   Assessment and Plan: 1. Acute recurrent frontal sinusitis (Primary) - doxycycline  (VIBRA -TABS) 100 MG tablet; Take 1 tablet (100 mg total) by mouth 2 (two) times daily.  Dispense: 20 tablet; Refill: 0 - cetirizine  (ZYRTEC ) 10 MG tablet; Take 1 tablet (10 mg total) by mouth as needed for allergies.  Dispense: 90 tablet; Refill: 1 - fluticasone  (FLONASE ) 50 MCG/ACT nasal spray; Place 2 sprays into both nostrils daily.  Dispense: 16 g; Refill: 6  - Take meds as prescribed - Use a cool mist humidifier  -Use saline nose sprays frequently -Force fluids -For any cough or congestion  Use plain Mucinex - regular strength or max strength is fine -For fever or aces or pains- take  tylenol or ibuprofen. -Throat lozenges if help -Follow up if symptoms worsen or do not improve   Follow Up Instructions: I discussed the assessment and treatment plan with the patient. The patient was provided an opportunity to ask questions and all were answered. The patient agreed with the plan and demonstrated an understanding of the instructions.  A copy of instructions were sent to the patient via MyChart unless otherwise noted below.    The patient was advised to call back or seek an in-person evaluation if the symptoms worsen or if the condition fails to improve as anticipated.    Bari Learn, FNP

## 2024-03-13 ENCOUNTER — Other Ambulatory Visit: Payer: Self-pay | Admitting: Family Medicine

## 2024-03-13 DIAGNOSIS — E785 Hyperlipidemia, unspecified: Secondary | ICD-10-CM

## 2024-03-15 NOTE — Telephone Encounter (Signed)
 Requested Prescriptions  Pending Prescriptions Disp Refills   ezetimibe  (ZETIA ) 10 MG tablet [Pharmacy Med Name: EZETIMIBE  10 MG TABLET] 90 tablet 1    Sig: TAKE 1 TABLET BY MOUTH EVERY DAY     Cardiovascular:  Antilipid - Sterol Transport Inhibitors Failed - 03/15/2024  2:31 PM      Failed - AST in normal range and within 360 days    AST  Date Value Ref Range Status  09/27/2022 20 10 - 35 U/L Final         Failed - ALT in normal range and within 360 days    ALT  Date Value Ref Range Status  09/27/2022 34 9 - 46 U/L Final         Failed - Lipid Panel in normal range within the last 12 months    Cholesterol  Date Value Ref Range Status  09/27/2022 134 <200 mg/dL Final   LDL Cholesterol (Calc)  Date Value Ref Range Status  09/27/2022 67 mg/dL (calc) Final    Comment:    Reference range: <100 . Desirable range <100 mg/dL for primary prevention;   <70 mg/dL for patients with CHD or diabetic patients  with > or = 2 CHD risk factors. SABRA LDL-C is now calculated using the Martin-Hopkins  calculation, which is a validated novel method providing  better accuracy than the Friedewald equation in the  estimation of LDL-C.  Gladis APPLETHWAITE et al. SANDREA. 7986;689(80): 2061-2068  (http://education.[facility].com/faq/FAQ164)    HDL  Date Value Ref Range Status  09/27/2022 38 (L) > OR = 40 mg/dL Final   Triglycerides  Date Value Ref Range Status  09/27/2022 249 (H) <150 mg/dL Final    Comment:    . If a non-fasting specimen was collected, consider repeat triglyceride testing on a fasting specimen if clinically indicated.  Veatrice et al. J. of Clin. Lipidol. 2015;9:129-169. SABRA          Passed - Patient is not pregnant      Passed - Valid encounter within last 12 months    Recent Outpatient Visits           4 months ago Adult general medical exam   [facility] Leavy Mole, PA-C       Future Appointments             In 9 months Hester Alm BROCKS, MD [facility]

## 2024-03-30 ENCOUNTER — Telehealth: Admitting: Physician Assistant

## 2024-03-30 DIAGNOSIS — R0981 Nasal congestion: Secondary | ICD-10-CM

## 2024-03-30 MED ORDER — PREDNISONE 10 MG (21) PO TBPK: ORAL_TABLET | ORAL | 0 refills | Status: DC

## 2024-03-30 NOTE — Progress Notes (Signed)
 E-Visit for Sinus Problems  We are sorry that you are not feeling well.  Here is how we plan to help!  Based on what you have shared with me it looks like you have sinusitis.  Sinusitis is inflammation in the sinus cavities of the head.  Saline nasal spray help and can safely be used as often as needed for congestion, I have prescribed:  A 6 day prednisone  taper.  Day 1: Take 2 tablets with breakfast, 1 tablet with lunch, 1 tablet with supper, and 2 tablets at bedtime Day 2: Take 2 tablets with breakfast, 1 tablet with lunch, 1 tablet with supper, and 1 tablet at bedtime Day 3: Take 1 tablet at breakfast, 1 tablet with lunch, 1 tablet with supper, and 1 tablet at bedtime Day 4: Take 1 tablet with breakfast, 1 tablet with supper and 1 tablet at bedtime Day 5: Take 1 tablet with breakfast and 1 tablet with supper or bedtime Day 6: Take 1 tablet with breakfast   Some authorities believe that zinc sprays or the use of Echinacea may shorten the course of your symptoms.  Home Care: Only take medications as instructed by your medical team. Do not take these medications with alcohol. A steam or ultrasonic humidifier can help congestion.  You can place a towel over your head and breathe in the steam from hot water coming from a faucet. Avoid close contacts especially the very young and the elderly. Cover your mouth when you cough or sneeze. Always remember to wash your hands.  Get Help Right Away If: You develop worsening fever or sinus pain. You develop a severe head ache or visual changes. Your symptoms persist after you have completed your treatment plan.  Make sure you Understand these instructions. Will watch your condition. Will get help right away if you are not doing well or get worse.   Thank you for choosing an e-visit.  Your e-visit answers were reviewed by a board certified advanced clinical practitioner to complete your personal care plan. Depending upon the condition, your  plan could have included both over the counter or prescription medications.  Please review your pharmacy choice. Make sure the pharmacy is open so you can pick up prescription now. If there is a problem, you may contact your provider through [employer] and have the prescription routed to another pharmacy.  Your safety is important to us . If you have drug allergies check your prescription carefully.   For the next 24 hours you can use MyChart to ask questions about today's visit, request a non-urgent call back, or ask for a work or school excuse. You will get an email in the next two days asking about your experience. I hope that your e-visit has been valuable and will speed your recovery.     I have spent 5 minutes in review of e-visit questionnaire, review and updating patient chart, medical decision making and response to patient.   Delon CHRISTELLA Dickinson, PA-C

## 2024-04-14 ENCOUNTER — Other Ambulatory Visit: Payer: Self-pay | Admitting: Family Medicine

## 2024-04-14 DIAGNOSIS — I10 Essential (primary) hypertension: Secondary | ICD-10-CM

## 2024-04-16 NOTE — Telephone Encounter (Signed)
 Requested medication (s) are due for refill today: yes  Requested medication (s) are on the active medication list: yes  Last refill:  01/20/24 #90  Future visit scheduled: yes  Notes to clinic:  overdue lab work    Requested Prescriptions  Pending Prescriptions Disp Refills   lisinopril  (ZESTRIL ) 10 MG tablet [Pharmacy Med Name: LISINOPRIL  10 MG TABLET] 90 tablet 0    Sig: TAKE 1 TABLET BY MOUTH EVERY DAY     Cardiovascular:  ACE Inhibitors Failed - 04/16/2024  3:35 PM      Failed - Cr in normal range and within 180 days    Creat  Date Value Ref Range Status  03/29/2023 0.95 0.70 - 1.35 mg/dL Final         Failed - K in normal range and within 180 days    Potassium  Date Value Ref Range Status  03/29/2023 4.7 3.5 - 5.3 mmol/L Final         Passed - Patient is not pregnant      Passed - Last BP in normal range    BP Readings from Last 1 Encounters:  11/15/23 128/68         Passed - Valid encounter within last 6 months    Recent Outpatient Visits           5 months ago Adult general medical exam   [facility] Leavy Mole, PA-C       Future Appointments             In 8 months Hester Alm BROCKS, MD [facility]

## 2024-05-17 ENCOUNTER — Ambulatory Visit (INDEPENDENT_AMBULATORY_CARE_PROVIDER_SITE_OTHER): Admitting: Internal Medicine

## 2024-05-17 ENCOUNTER — Ambulatory Visit: Admitting: Family Medicine

## 2024-05-17 ENCOUNTER — Encounter: Payer: Self-pay | Admitting: Internal Medicine

## 2024-05-17 VITALS — BP 114/70 | HR 63 | Temp 97.9°F | Resp 18 | Ht 75.0 in | Wt 235.0 lb

## 2024-05-17 DIAGNOSIS — E785 Hyperlipidemia, unspecified: Secondary | ICD-10-CM | POA: Diagnosis not present

## 2024-05-17 DIAGNOSIS — M62838 Other muscle spasm: Secondary | ICD-10-CM

## 2024-05-17 DIAGNOSIS — I10 Essential (primary) hypertension: Secondary | ICD-10-CM | POA: Diagnosis not present

## 2024-05-17 DIAGNOSIS — J302 Other seasonal allergic rhinitis: Secondary | ICD-10-CM

## 2024-05-17 DIAGNOSIS — K219 Gastro-esophageal reflux disease without esophagitis: Secondary | ICD-10-CM

## 2024-05-17 DIAGNOSIS — Z23 Encounter for immunization: Secondary | ICD-10-CM | POA: Diagnosis not present

## 2024-05-17 DIAGNOSIS — Z125 Encounter for screening for malignant neoplasm of prostate: Secondary | ICD-10-CM

## 2024-05-17 MED ORDER — TIZANIDINE HCL 4 MG PO TABS: 4.0000 mg | ORAL_TABLET | Freq: Every evening | ORAL | 0 refills | Status: DC | PRN

## 2024-05-17 MED ORDER — ATORVASTATIN CALCIUM 80 MG PO TABS: 80.0000 mg | ORAL_TABLET | Freq: Every day | ORAL | 3 refills | Status: AC

## 2024-05-17 MED ORDER — LISINOPRIL 10 MG PO TABS: 10.0000 mg | ORAL_TABLET | Freq: Every day | ORAL | 1 refills | Status: AC

## 2024-05-17 NOTE — Progress Notes (Signed)
 Established Patient Office Visit  Subjective   Patient ID: Curtis Underwood, male    DOB: Jan 11, 1956  Age: 68 y.o. MRN: [id number]  Chief Complaint  Patient presents with   Medical Management of Chronic Issues   Hypertension   Hyperlipidemia    Hypertension Pertinent negatives include no shortness of breath.  Hyperlipidemia Pertinent negatives include no shortness of breath.    Patient is here for follow up on chronic medical conditions. This is my first time meeting him.   Discussed the use of AI scribe software for clinical note transcription with the patient, who gave verbal consent to proceed.  History of Present Illness Curtis Underwood is a 68 year old male who presents for a routine follow-up visit.  He manages hypertension with lisinopril  and experiences an occasional dry cough, which is not bothersome. For hyperlipidemia, he takes atorvastatin  80 mg and ezetimibe  10 mg. He experiences muscle cramps, particularly in his calves, which he attributes to his medication. He stays hydrated and uses electrolytes to manage these cramps.  His sinus issues have improved significantly after a course of steroids, and he no longer requires nasal sprays. He occasionally uses a muscle relaxer for neck tightness, which has improved since retirement. He previously used Prilosec for acid reflux but now uses a natural remedy effectively.  Hypertension: -Medications: Lisinopril  10 mg -Patient is compliant with above medications and reports no side effects with exception of mild cough.  -Denies any SOB, CP, vision changes, LE edema or symptoms of hypotension  HLD: -Medications: Lipitor 80 mg, Zetia  10 mg -Patient is compliant with above medications and reports no side effects.  -Last lipid panel: Lipid Panel     Component Value Date/Time   CHOL 134 09/27/2022 0951   TRIG 249 (H) 09/27/2022 0951   HDL 38 (L) 09/27/2022 0951   CHOLHDL 3.5 09/27/2022 0951   VLDL 31 (H) 11/16/2016  1235   LDLCALC 67 09/27/2022 0951    Health Maintenance: -Blood work due -Colon cancer screening: Cologuard 9/24 negative -Flu vaccine today    Review of Systems  Respiratory:  Positive for cough. Negative for sputum production, shortness of breath and wheezing.   All other systems reviewed and are negative.     Objective:     BP 114/70   Pulse 63   Temp 97.9 F (36.6 C)   Resp 18   Ht 6' 3 (1.905 m)   Wt 235 lb (106.6 kg)   SpO2 97%   BMI 29.37 kg/m  BP Readings from Last 3 Encounters:  05/17/24 114/70  11/15/23 128/68  08/25/23 120/76   Wt Readings from Last 3 Encounters:  05/17/24 235 lb (106.6 kg)  11/15/23 232 lb (105.2 kg)  08/25/23 232 lb 3.2 oz (105.3 kg)      Physical Exam Constitutional:      Appearance: Normal appearance.  HENT:     Head: Normocephalic and atraumatic.     Mouth/Throat:     Mouth: Mucous membranes are moist.     Pharynx: Oropharynx is clear.  Eyes:     Extraocular Movements: Extraocular movements intact.     Conjunctiva/sclera: Conjunctivae normal.     Pupils: Pupils are equal, round, and reactive to light.  Neck:     Comments: No thyromegaly Cardiovascular:     Rate and Rhythm: Normal rate and regular rhythm.  Pulmonary:     Effort: Pulmonary effort is normal.     Breath sounds: Normal breath sounds.  Musculoskeletal:  Cervical back: No tenderness.     Right lower leg: No edema.     Left lower leg: No edema.  Lymphadenopathy:     Cervical: No cervical adenopathy.  Skin:    General: Skin is warm and dry.  Neurological:     General: No focal deficit present.     Mental Status: He is alert. Mental status is at baseline.  Psychiatric:        Mood and Affect: Mood normal.        Behavior: Behavior normal.      No results found for any visits on 05/17/24.  Last CBC Lab Results  Component Value Date   WBC 6.9 09/27/2022   HGB 15.1 09/27/2022   HCT 45.5 09/27/2022   MCV 91.4 09/27/2022   MCH 30.3  09/27/2022   RDW 12.2 09/27/2022   PLT 242 09/27/2022   Last metabolic panel Lab Results  Component Value Date   GLUCOSE 105 (H) 03/29/2023   NA 140 03/29/2023   K 4.7 03/29/2023   CL 105 03/29/2023   CO2 26 03/29/2023   BUN 14 03/29/2023   CREATININE 0.95 03/29/2023   EGFR 88 03/29/2023   CALCIUM  9.3 03/29/2023   PROT 6.7 09/27/2022   ALBUMIN 4.4 11/16/2016   BILITOT 0.6 09/27/2022   ALKPHOS 91 11/16/2016   AST 20 09/27/2022   ALT 34 09/27/2022   ANIONGAP 7 12/02/2017   Last lipids Lab Results  Component Value Date   CHOL 134 09/27/2022   HDL 38 (L) 09/27/2022   LDLCALC 67 09/27/2022   TRIG 249 (H) 09/27/2022   CHOLHDL 3.5 09/27/2022   Last hemoglobin A1c No results found for: HGBA1C Last thyroid functions Lab Results  Component Value Date   TSH 2.93 11/16/2016   Last vitamin D  Lab Results  Component Value Date   VD25OH 27 (L) 01/31/2020   Last vitamin B12 and Folate No results found for: VITAMINB12, FOLATE    The 10-year ASCVD risk score (Arnett DK, et al., 2019) is: 16.6%    Assessment & Plan:   Assessment & Plan Adult Wellness Visit Labs pending due to previous backup. Discussed importance for monitoring medication effects and health. - Order labs for cholesterol, kidney, liver, electrolytes, and prostate cancer screening.  Essential Hypertension Blood pressure controlled on lisinopril . Occasional cough noted, not bothersome enough for medication change. Discussed potential switch if cough worsens. - Refill lisinopril . - Monitor for increased cough frequency and consider medication change if necessary.  Hyperlipidemia LDL controlled on atorvastatin  and ezetimibe . Triglycerides previously high. Discussed statin benefits and potential side effects. Reports calf cramps possibly related to statin. - Refill atorvastatin  (Lipitor). - Order labs to monitor cholesterol levels and liver function. - Continue ezetimibe  (Zetia ).  Gastroesophageal  Reflux Disease (GERD) Effective management with natural remedy. Discussed lifestyle modifications. - Discontinue Prilosec from medication list.  Allergic Rhinitis Nasal congestion resolved with steroid. Flonase  and Zyrtec  available for as-needed use. - Continue Flonase  and Zyrtec  as needed.  Muscle Spasms, Neck Region Occasional neck tightness improved post-retirement. Uses wife's medication as needed. Muscle relaxer aids sleep. - Refill muscle relaxer for use at bedtime as needed.  - CBC w/Diff/Platelet - Comprehensive Metabolic Panel (CMET) - lisinopril  (ZESTRIL ) 10 MG tablet; Take 1 tablet (10 mg total) by mouth daily.  Dispense: 90 tablet; Refill: 1 - Lipid Profile - atorvastatin  (LIPITOR) 80 MG tablet; Take 1 tablet (80 mg total) by mouth daily.  Dispense: 90 tablet; Refill: 3 - tiZANidine  (ZANAFLEX ) 4 MG tablet;  Take 1 tablet (4 mg total) by mouth at bedtime as needed for muscle spasms. As needed for muscle spasms  Dispense: 90 tablet; Refill: 0 - PSA - Flu vaccine HIGH DOSE PF(Fluzone Trivalent)   Return in about 6 months (around 11/15/2024).    Sharyle Fischer, DO

## 2024-05-18 ENCOUNTER — Ambulatory Visit: Payer: Self-pay | Admitting: Internal Medicine

## 2024-05-18 LAB — COMPREHENSIVE METABOLIC PANEL WITH GFR
AG Ratio: 2.2 (calc) (ref 1.0–2.5)
ALT: 39 U/L (ref 9–46)
AST: 21 U/L (ref 10–35)
Albumin: 4.6 g/dL (ref 3.6–5.1)
Alkaline phosphatase (APISO): 76 U/L (ref 35–144)
BUN: 12 mg/dL (ref 7–25)
CO2: 25 mmol/L (ref 20–32)
Calcium: 9.5 mg/dL (ref 8.6–10.3)
Chloride: 102 mmol/L (ref 98–110)
Creat: 0.99 mg/dL (ref 0.70–1.35)
Globulin: 2.1 g/dL (ref 1.9–3.7)
Glucose, Bld: 104 mg/dL — ABNORMAL HIGH (ref 65–99)
Potassium: 4.7 mmol/L (ref 3.5–5.3)
Sodium: 137 mmol/L (ref 135–146)
Total Bilirubin: 0.6 mg/dL (ref 0.2–1.2)
Total Protein: 6.7 g/dL (ref 6.1–8.1)
eGFR: 83 mL/min/1.73m2 (ref >=60)

## 2024-05-18 LAB — CBC WITH DIFFERENTIAL/PLATELET
Absolute Lymphocytes: 1569 {cells}/uL (ref 850–3900)
Absolute Monocytes: 639 {cells}/uL (ref 200–950)
Basophils Absolute: 57 {cells}/uL (ref 0–200)
Basophils Relative: 0.8 %
Eosinophils Absolute: 142 {cells}/uL (ref 15–500)
Eosinophils Relative: 2 %
HCT: 44.9 % (ref 38.5–50.0)
Hemoglobin: 14.8 g/dL (ref 13.2–17.1)
MCH: 31.2 pg (ref 27.0–33.0)
MCHC: 33 g/dL (ref 32.0–36.0)
MCV: 94.5 fL (ref 80.0–100.0)
MPV: 12.7 fL — ABNORMAL HIGH (ref 7.5–12.5)
Monocytes Relative: 9 %
Neutro Abs: 4693 {cells}/uL (ref 1500–7800)
Neutrophils Relative %: 66.1 %
Platelets: 227 Thousand/uL (ref 140–400)
RBC: 4.75 Million/uL (ref 4.20–5.80)
RDW: 12.2 % (ref 11.0–15.0)
Total Lymphocyte: 22.1 %
WBC: 7.1 Thousand/uL (ref 3.8–10.8)

## 2024-05-18 LAB — PSA: PSA: 0.93 ng/mL (ref <=4.00)

## 2024-05-18 LAB — LIPID PANEL
Cholesterol: 285 mg/dL — ABNORMAL HIGH (ref <200)
HDL: 44 mg/dL (ref >=40)
LDL Cholesterol (Calc): 198 mg/dL — ABNORMAL HIGH
Non-HDL Cholesterol (Calc): 241 mg/dL — ABNORMAL HIGH (ref <130)
Total CHOL/HDL Ratio: 6.5 (calc) — ABNORMAL HIGH (ref <5.0)
Triglycerides: 231 mg/dL — ABNORMAL HIGH (ref <150)

## 2024-07-19 ENCOUNTER — Telehealth: Admitting: Physician Assistant

## 2024-07-19 DIAGNOSIS — B9689 Other specified bacterial agents as the cause of diseases classified elsewhere: Secondary | ICD-10-CM | POA: Diagnosis not present

## 2024-07-19 DIAGNOSIS — J019 Acute sinusitis, unspecified: Secondary | ICD-10-CM | POA: Diagnosis not present

## 2024-07-19 MED ORDER — AMOXICILLIN-POT CLAVULANATE 875-125 MG PO TABS: 1.0000 | ORAL_TABLET | Freq: Two times a day (BID) | ORAL | 0 refills | Status: AC

## 2024-07-19 NOTE — Progress Notes (Signed)

## 2024-08-09 ENCOUNTER — Other Ambulatory Visit: Payer: Self-pay | Admitting: Internal Medicine

## 2024-08-09 DIAGNOSIS — M62838 Other muscle spasm: Secondary | ICD-10-CM

## 2024-08-09 NOTE — Telephone Encounter (Signed)
 Requested medication (s) are due for refill today: yes  Requested medication (s) are on the active medication list: yes  Last refill:  05/17/24  Future visit scheduled: yes  Notes to clinic:  Unable to refill per protocol, cannot delegate.      Requested Prescriptions  Pending Prescriptions Disp Refills   tiZANidine  (ZANAFLEX ) 4 MG tablet [Pharmacy Med Name: TIZANIDINE  HCL 4 MG TABLET] 90 tablet 0    Sig: Take 1 tablet (4 mg total) by mouth at bedtime as needed for muscle spasms. As needed for muscle spasms     Not Delegated - Cardiovascular:  Alpha-2 Agonists - tizanidine  Failed - 08/09/2024  4:28 PM      Failed - This refill cannot be delegated      Passed - Valid encounter within last 6 months    Recent Outpatient Visits           2 months ago Primary hypertension   [facility] Bernardo Fend, DO   8 months ago Adult general medical exam   [facility] Leavy Mole, PA-C       Future Appointments             In 4 months Hester Alm BROCKS, MD [facility]

## 2024-08-27 ENCOUNTER — Telehealth: Admitting: Physician Assistant

## 2024-08-27 DIAGNOSIS — I872 Venous insufficiency (chronic) (peripheral): Secondary | ICD-10-CM | POA: Diagnosis not present

## 2024-08-27 NOTE — Progress Notes (Signed)
 E Visit for Rash  We are sorry that you are not feeling well. Here is how we plan to help!  I do believe this rash appears consistent with something called Venous Stasis Dermatitis.   This is secondary to venous insufficiency. There is not a direct treatment for this type of rash. The best things are to elevate your legs as much as possible, wear compression stockings during the day while up and awake (remove at bedtime), and use a really good moisturizer on your legs, such as Aquaphor, Lubriderm, or Eucerin. You can apply cold compresses over the areas when they are really itchy. This helps decrease the itch-scratch reflex. I will send more information following.   HOME CARE:  Take cool showers and avoid direct sunlight. Apply cool compress or wet dressings. Take a bath in an oatmeal bath.  Sprinkle content of one Aveeno packet under running faucet with comfortably warm water.  Bathe for 15-20 minutes, 1-2 times daily.  Pat dry with a towel. Do not rub the rash. Use hydrocortisone cream. Take an antihistamine like Benadryl for widespread rashes that itch.  The adult dose of Benadryl is 25-50 mg by mouth 4 times daily. Caution:  This type of medication may cause sleepiness.  Do not drink alcohol, drive, or operate dangerous machinery while taking antihistamines.  Do not take these medications if you have prostate enlargement.  Read package instructions thoroughly on all medications that you take.  GET HELP RIGHT AWAY IF:  Symptoms don't go away after treatment. Severe itching that persists. If you rash spreads or swells. If you rash begins to smell. If it blisters and opens or develops a yellow-brown crust. You develop a fever. You have a sore throat. You become short of breath.  MAKE SURE YOU:  Understand these instructions. Will watch your condition. Will get help right away if you are not doing well or get worse.  Thank you for choosing an e-visit. Your e-visit answers were  reviewed by a board certified advanced clinical practitioner to complete your personal care plan. Depending upon the condition, your plan could have included both over the counter or prescription medications. Please review your pharmacy choice. Be sure that the pharmacy you have chosen is open so that you can pick up your prescription now.  If there is a problem you may message your provider in MyChart to have the prescription routed to another pharmacy. Your safety is important to us . If you have drug allergies check your prescription carefully.  For the next 24 hours, you can use MyChart to ask questions about todays visit, request a non-urgent call back, or ask for a work or school excuse from your e-visit provider. You will get an email in the next two days asking about your experience. I hope that your e-visit has been valuable and will speed your recovery.  I have spent 5 minutes in review of e-visit questionnaire, review and updating patient chart, medical decision making and response to patient.   Delon CHRISTELLA Dickinson, PA-C

## 2024-08-29 ENCOUNTER — Telehealth: Payer: Self-pay

## 2024-08-29 NOTE — Telephone Encounter (Signed)
 Pharmacy Quality Measure Review  This patient is appearing on a report for being at risk of failing the adherence measure for cholesterol (statin) medications this calendar year.   Medication: atorvastatin  Last fill date: 05/17/24 for 90 day supply  Contacted pharmacy to facilitate refills.  Amiliana Foutz E. Marsh, PharmD, BCACP, CPP Clinical Pharmacist [facility] [phone]

## 2024-09-04 ENCOUNTER — Other Ambulatory Visit: Payer: Self-pay | Admitting: Internal Medicine

## 2024-09-04 DIAGNOSIS — M62838 Other muscle spasm: Secondary | ICD-10-CM

## 2024-09-05 NOTE — Telephone Encounter (Signed)
 Requested medication (s) are due for refill today: yes  Requested medication (s) are on the active medication list: yes  Last refill:  08/10/24  Future visit scheduled: yes  Notes to clinic:  Unable to refill per protocol, cannot delegate.      Requested Prescriptions  Pending Prescriptions Disp Refills   tiZANidine  (ZANAFLEX ) 4 MG tablet [Pharmacy Med Name: TIZANIDINE  HCL 4 MG TABLET] 90 tablet 1    Sig: TAKE 1 TABLET BY MOUTH AT BEDTIME AS NEEDED FOR MUSCLE SPASMS     Not Delegated - Cardiovascular:  Alpha-2 Agonists - tizanidine  Failed - 09/05/2024  3:24 PM      Failed - This refill cannot be delegated      Passed - Valid encounter within last 6 months    Recent Outpatient Visits           3 months ago Primary hypertension   [facility] Bernardo Fend, DO   9 months ago Adult general medical exam   [facility] Leavy Mole, PA-C       Future Appointments             In 3 months Hester Alm BROCKS, MD [facility]

## 2024-09-21 ENCOUNTER — Ambulatory Visit

## 2024-11-15 ENCOUNTER — Ambulatory Visit: Admitting: Family Medicine

## 2024-12-27 ENCOUNTER — Ambulatory Visit: Admitting: Dermatology
